# Patient Record
Sex: Female | Born: 1950 | Race: Black or African American | Hispanic: No | Marital: Married | Smoking: Former smoker
Health system: Southern US, Community
[De-identification: ages and names within clinical notes are randomized; demographics above are authoritative.]

## PROBLEM LIST (undated history)

## (undated) DIAGNOSIS — K219 Gastro-esophageal reflux disease without esophagitis: Secondary | ICD-10-CM

## (undated) DIAGNOSIS — F329 Major depressive disorder, single episode, unspecified: Secondary | ICD-10-CM

## (undated) DIAGNOSIS — G629 Polyneuropathy, unspecified: Secondary | ICD-10-CM

## (undated) DIAGNOSIS — T4145XA Adverse effect of unspecified anesthetic, initial encounter: Secondary | ICD-10-CM

## (undated) DIAGNOSIS — R7309 Other abnormal glucose: Secondary | ICD-10-CM

## (undated) DIAGNOSIS — F419 Anxiety disorder, unspecified: Secondary | ICD-10-CM

## (undated) DIAGNOSIS — T8859XA Other complications of anesthesia, initial encounter: Secondary | ICD-10-CM

## (undated) DIAGNOSIS — F32A Depression, unspecified: Secondary | ICD-10-CM

## (undated) DIAGNOSIS — C3490 Malignant neoplasm of unspecified part of unspecified bronchus or lung: Secondary | ICD-10-CM

## (undated) HISTORY — DX: Gastro-esophageal reflux disease without esophagitis: K21.9

## (undated) HISTORY — PX: ABDOMINAL HYSTERECTOMY: SHX81

## (undated) HISTORY — DX: Depression, unspecified: F32.A

## (undated) HISTORY — DX: Anxiety disorder, unspecified: F41.9

## (undated) HISTORY — DX: Other abnormal glucose: R73.09

## (undated) HISTORY — PX: KNEE ARTHROSCOPY: SUR90

## (undated) HISTORY — DX: Major depressive disorder, single episode, unspecified: F32.9

## (undated) HISTORY — PX: FOOT SURGERY: SHX648

## (undated) HISTORY — PX: OTHER SURGICAL HISTORY: SHX169

## (undated) HISTORY — PX: TUBAL LIGATION: SHX77

## (undated) HISTORY — DX: Polyneuropathy, unspecified: G62.9

---

## 1999-07-17 ENCOUNTER — Encounter: Payer: Self-pay | Admitting: Internal Medicine

## 1999-07-17 ENCOUNTER — Encounter: Admission: RE | Admit: 1999-07-17 | Discharge: 1999-07-17 | Payer: Self-pay | Admitting: Internal Medicine

## 1999-07-24 ENCOUNTER — Encounter: Admission: RE | Admit: 1999-07-24 | Discharge: 1999-07-24 | Payer: Self-pay | Admitting: Internal Medicine

## 1999-07-24 ENCOUNTER — Encounter: Payer: Self-pay | Admitting: Internal Medicine

## 2001-09-16 ENCOUNTER — Other Ambulatory Visit: Admission: RE | Admit: 2001-09-16 | Discharge: 2001-09-16 | Payer: Self-pay | Admitting: Obstetrics and Gynecology

## 2002-09-21 ENCOUNTER — Other Ambulatory Visit: Admission: RE | Admit: 2002-09-21 | Discharge: 2002-09-21 | Payer: Self-pay | Admitting: Obstetrics and Gynecology

## 2003-04-07 ENCOUNTER — Emergency Department (HOSPITAL_COMMUNITY): Admission: AD | Admit: 2003-04-07 | Discharge: 2003-04-07 | Payer: Self-pay | Admitting: Family Medicine

## 2003-07-26 ENCOUNTER — Emergency Department (HOSPITAL_COMMUNITY): Admission: EM | Admit: 2003-07-26 | Discharge: 2003-07-26 | Payer: Self-pay | Admitting: Family Medicine

## 2003-12-05 ENCOUNTER — Other Ambulatory Visit: Admission: RE | Admit: 2003-12-05 | Discharge: 2003-12-05 | Payer: Self-pay | Admitting: Obstetrics and Gynecology

## 2004-01-07 ENCOUNTER — Emergency Department (HOSPITAL_COMMUNITY): Admission: EM | Admit: 2004-01-07 | Discharge: 2004-01-07 | Payer: Self-pay | Admitting: Family Medicine

## 2004-07-29 ENCOUNTER — Ambulatory Visit: Payer: Self-pay | Admitting: Internal Medicine

## 2004-08-27 ENCOUNTER — Ambulatory Visit: Payer: Self-pay | Admitting: Internal Medicine

## 2004-09-03 ENCOUNTER — Ambulatory Visit: Payer: Self-pay | Admitting: Internal Medicine

## 2004-12-24 ENCOUNTER — Ambulatory Visit: Payer: Self-pay | Admitting: Internal Medicine

## 2005-01-07 ENCOUNTER — Ambulatory Visit: Payer: Self-pay | Admitting: Internal Medicine

## 2005-02-06 ENCOUNTER — Ambulatory Visit: Payer: Self-pay | Admitting: Internal Medicine

## 2005-03-07 ENCOUNTER — Ambulatory Visit: Payer: Self-pay | Admitting: Internal Medicine

## 2005-08-21 ENCOUNTER — Ambulatory Visit: Payer: Self-pay | Admitting: Internal Medicine

## 2005-08-25 ENCOUNTER — Ambulatory Visit: Payer: Self-pay | Admitting: Internal Medicine

## 2005-12-25 ENCOUNTER — Ambulatory Visit: Payer: Self-pay | Admitting: Internal Medicine

## 2006-02-17 ENCOUNTER — Ambulatory Visit: Payer: Self-pay | Admitting: Internal Medicine

## 2006-03-18 ENCOUNTER — Ambulatory Visit: Payer: Self-pay | Admitting: Internal Medicine

## 2006-05-09 ENCOUNTER — Ambulatory Visit: Payer: Self-pay | Admitting: Internal Medicine

## 2006-05-25 ENCOUNTER — Ambulatory Visit: Payer: Self-pay | Admitting: Internal Medicine

## 2006-06-19 ENCOUNTER — Ambulatory Visit: Payer: Self-pay | Admitting: Internal Medicine

## 2006-07-16 ENCOUNTER — Ambulatory Visit: Payer: Self-pay | Admitting: Internal Medicine

## 2006-09-03 ENCOUNTER — Ambulatory Visit: Payer: Self-pay | Admitting: Internal Medicine

## 2006-09-03 LAB — CONVERTED CEMR LAB
BUN: 11 mg/dL (ref 6–23)
CO2: 29 meq/L (ref 19–32)
Calcium: 9.3 mg/dL (ref 8.4–10.5)
Chloride: 104 meq/L (ref 96–112)
Creatinine, Ser: 0.9 mg/dL (ref 0.4–1.2)
GFR calc Af Amer: 84 mL/min
GFR calc non Af Amer: 69 mL/min
Glucose, Bld: 98 mg/dL (ref 70–99)
Hgb A1c MFr Bld: 5.6 % (ref 4.6–6.0)
Potassium: 4.2 meq/L (ref 3.5–5.1)
Sodium: 142 meq/L (ref 135–145)
Vit D, 1,25-Dihydroxy: 34 (ref 20–57)

## 2006-12-31 ENCOUNTER — Encounter: Payer: Self-pay | Admitting: Internal Medicine

## 2006-12-31 ENCOUNTER — Ambulatory Visit: Payer: Self-pay | Admitting: Internal Medicine

## 2006-12-31 DIAGNOSIS — F329 Major depressive disorder, single episode, unspecified: Secondary | ICD-10-CM | POA: Insufficient documentation

## 2006-12-31 DIAGNOSIS — F411 Generalized anxiety disorder: Secondary | ICD-10-CM | POA: Insufficient documentation

## 2006-12-31 DIAGNOSIS — I10 Essential (primary) hypertension: Secondary | ICD-10-CM | POA: Insufficient documentation

## 2006-12-31 DIAGNOSIS — G609 Hereditary and idiopathic neuropathy, unspecified: Secondary | ICD-10-CM | POA: Insufficient documentation

## 2007-04-15 ENCOUNTER — Telehealth: Payer: Self-pay | Admitting: Internal Medicine

## 2007-06-24 ENCOUNTER — Ambulatory Visit: Payer: Self-pay | Admitting: Internal Medicine

## 2007-06-24 LAB — CONVERTED CEMR LAB
ALT: 24 units/L (ref 0–35)
AST: 22 units/L (ref 0–37)
Albumin: 3.5 g/dL (ref 3.5–5.2)
Alkaline Phosphatase: 72 units/L (ref 39–117)
BUN: 10 mg/dL (ref 6–23)
Basophils Absolute: 0 10*3/uL (ref 0.0–0.1)
Basophils Relative: 0.2 % (ref 0.0–1.0)
Bilirubin, Direct: 0.1 mg/dL (ref 0.0–0.3)
CO2: 30 meq/L (ref 19–32)
Calcium: 9 mg/dL (ref 8.4–10.5)
Chloride: 105 meq/L (ref 96–112)
Creatinine, Ser: 1 mg/dL (ref 0.4–1.2)
Eosinophils Absolute: 0.1 10*3/uL (ref 0.0–0.7)
Eosinophils Relative: 1.7 % (ref 0.0–5.0)
GFR calc Af Amer: 74 mL/min
GFR calc non Af Amer: 61 mL/min
Glucose, Bld: 99 mg/dL (ref 70–99)
HCT: 42.1 % (ref 36.0–46.0)
Hemoglobin: 13.8 g/dL (ref 12.0–15.0)
Hgb A1c MFr Bld: 5.6 % (ref 4.6–6.0)
Lymphocytes Relative: 27.4 % (ref 12.0–46.0)
MCHC: 32.7 g/dL (ref 30.0–36.0)
MCV: 90.3 fL (ref 78.0–100.0)
Monocytes Absolute: 0.4 10*3/uL (ref 0.1–1.0)
Monocytes Relative: 7.4 % (ref 3.0–12.0)
Neutro Abs: 3.6 10*3/uL (ref 1.4–7.7)
Neutrophils Relative %: 63.3 % (ref 43.0–77.0)
Platelets: 255 10*3/uL (ref 150–400)
Potassium: 4 meq/L (ref 3.5–5.1)
RBC: 4.66 M/uL (ref 3.87–5.11)
RDW: 11.9 % (ref 11.5–14.6)
Sodium: 139 meq/L (ref 135–145)
TSH: 1.52 microintl units/mL (ref 0.35–5.50)
Total Bilirubin: 0.7 mg/dL (ref 0.3–1.2)
Total Protein: 6.7 g/dL (ref 6.0–8.3)
Vitamin B-12: 1091 pg/mL — ABNORMAL HIGH (ref 211–911)
WBC: 5.7 10*3/uL (ref 4.5–10.5)

## 2007-06-30 ENCOUNTER — Ambulatory Visit: Payer: Self-pay | Admitting: Internal Medicine

## 2007-06-30 DIAGNOSIS — J309 Allergic rhinitis, unspecified: Secondary | ICD-10-CM | POA: Insufficient documentation

## 2007-08-03 ENCOUNTER — Telehealth: Payer: Self-pay | Admitting: Internal Medicine

## 2007-11-04 ENCOUNTER — Encounter: Payer: Self-pay | Admitting: Internal Medicine

## 2007-11-11 ENCOUNTER — Telehealth: Payer: Self-pay | Admitting: Internal Medicine

## 2007-11-16 ENCOUNTER — Encounter: Payer: Self-pay | Admitting: Internal Medicine

## 2007-12-20 ENCOUNTER — Encounter: Payer: Self-pay | Admitting: Internal Medicine

## 2007-12-24 ENCOUNTER — Ambulatory Visit: Payer: Self-pay | Admitting: Internal Medicine

## 2007-12-24 LAB — CONVERTED CEMR LAB
ALT: 43 units/L — ABNORMAL HIGH (ref 0–35)
AST: 31 units/L (ref 0–37)
Albumin: 3.6 g/dL (ref 3.5–5.2)
Alkaline Phosphatase: 82 units/L (ref 39–117)
BUN: 13 mg/dL (ref 6–23)
Bilirubin, Direct: 0.1 mg/dL (ref 0.0–0.3)
CO2: 30 meq/L (ref 19–32)
Calcium: 9 mg/dL (ref 8.4–10.5)
Chloride: 105 meq/L (ref 96–112)
Creatinine, Ser: 0.9 mg/dL (ref 0.4–1.2)
GFR calc Af Amer: 83 mL/min
GFR calc non Af Amer: 69 mL/min
Glucose, Bld: 99 mg/dL (ref 70–99)
Potassium: 3.6 meq/L (ref 3.5–5.1)
Sodium: 141 meq/L (ref 135–145)
TSH: 1.86 microintl units/mL (ref 0.35–5.50)
Total Bilirubin: 0.5 mg/dL (ref 0.3–1.2)
Total Protein: 6.9 g/dL (ref 6.0–8.3)

## 2007-12-29 ENCOUNTER — Ambulatory Visit: Payer: Self-pay | Admitting: Internal Medicine

## 2008-05-01 ENCOUNTER — Ambulatory Visit: Payer: Self-pay | Admitting: Internal Medicine

## 2008-05-01 DIAGNOSIS — R05 Cough: Secondary | ICD-10-CM

## 2008-05-01 DIAGNOSIS — R609 Edema, unspecified: Secondary | ICD-10-CM

## 2008-05-03 ENCOUNTER — Ambulatory Visit: Payer: Self-pay | Admitting: Endocrinology

## 2008-07-27 ENCOUNTER — Ambulatory Visit: Payer: Self-pay | Admitting: Internal Medicine

## 2008-07-27 LAB — CONVERTED CEMR LAB
BUN: 10 mg/dL (ref 6–23)
CO2: 27 meq/L (ref 19–32)
Calcium: 9.3 mg/dL (ref 8.4–10.5)
Chloride: 107 meq/L (ref 96–112)
Creatinine, Ser: 1 mg/dL (ref 0.4–1.2)
GFR calc non Af Amer: 73.48 mL/min (ref >60)
Glucose, Bld: 121 mg/dL — ABNORMAL HIGH (ref 70–99)
Potassium: 3.5 meq/L (ref 3.5–5.1)
Sodium: 143 meq/L (ref 135–145)
TSH: 1.68 microintl units/mL (ref 0.35–5.50)

## 2008-07-28 ENCOUNTER — Ambulatory Visit: Payer: Self-pay | Admitting: Internal Medicine

## 2008-07-28 DIAGNOSIS — K219 Gastro-esophageal reflux disease without esophagitis: Secondary | ICD-10-CM

## 2008-08-04 ENCOUNTER — Encounter: Admission: RE | Admit: 2008-08-04 | Discharge: 2008-08-04 | Payer: Self-pay | Admitting: Internal Medicine

## 2008-10-04 ENCOUNTER — Telehealth: Payer: Self-pay | Admitting: Internal Medicine

## 2008-10-20 ENCOUNTER — Ambulatory Visit: Payer: Self-pay | Admitting: Internal Medicine

## 2008-10-20 LAB — CONVERTED CEMR LAB
Basophils Relative: 1.2 % (ref 0.0–3.0)
Bilirubin Urine: NEGATIVE
CO2: 28 meq/L (ref 19–32)
Chloride: 103 meq/L (ref 96–112)
Creatinine, Ser: 1 mg/dL (ref 0.4–1.2)
Eosinophils Relative: 1.2 % (ref 0.0–5.0)
HCT: 40.3 % (ref 36.0–46.0)
Hemoglobin, Urine: NEGATIVE
Hemoglobin: 13.4 g/dL (ref 12.0–15.0)
Lymphs Abs: 1.8 10*3/uL (ref 0.7–4.0)
MCV: 89.4 fL (ref 78.0–100.0)
Monocytes Absolute: 0.5 10*3/uL (ref 0.1–1.0)
Monocytes Relative: 7.5 % (ref 3.0–12.0)
Potassium: 4 meq/L (ref 3.5–5.1)
RBC: 4.51 M/uL (ref 3.87–5.11)
Sodium: 141 meq/L (ref 135–145)
Total Protein, Urine: NEGATIVE mg/dL
Urine Glucose: NEGATIVE mg/dL
WBC: 6.1 10*3/uL (ref 4.5–10.5)
pH: 7.5 (ref 5.0–8.0)

## 2008-10-25 ENCOUNTER — Ambulatory Visit: Payer: Self-pay | Admitting: Internal Medicine

## 2008-11-29 ENCOUNTER — Encounter: Payer: Self-pay | Admitting: Internal Medicine

## 2008-12-12 ENCOUNTER — Telehealth: Payer: Self-pay | Admitting: Internal Medicine

## 2008-12-13 ENCOUNTER — Encounter: Payer: Self-pay | Admitting: Internal Medicine

## 2008-12-18 ENCOUNTER — Ambulatory Visit: Payer: Self-pay | Admitting: Internal Medicine

## 2009-01-02 ENCOUNTER — Telehealth: Payer: Self-pay | Admitting: Internal Medicine

## 2009-02-16 ENCOUNTER — Ambulatory Visit: Payer: Self-pay | Admitting: Internal Medicine

## 2009-02-16 LAB — CONVERTED CEMR LAB
ALT: 24 units/L (ref 0–35)
Albumin: 3.8 g/dL (ref 3.5–5.2)
Alkaline Phosphatase: 114 units/L (ref 39–117)
Bilirubin, Direct: 0.1 mg/dL (ref 0.0–0.3)
CO2: 31 meq/L (ref 19–32)
Calcium: 9.6 mg/dL (ref 8.4–10.5)
Chloride: 106 meq/L (ref 96–112)
Creatinine, Ser: 0.9 mg/dL (ref 0.4–1.2)
Glucose, Bld: 101 mg/dL — ABNORMAL HIGH (ref 70–99)
Sodium: 142 meq/L (ref 135–145)
Total Protein: 7.2 g/dL (ref 6.0–8.3)

## 2009-02-23 ENCOUNTER — Ambulatory Visit: Payer: Self-pay | Admitting: Internal Medicine

## 2009-02-23 DIAGNOSIS — Z87891 Personal history of nicotine dependence: Secondary | ICD-10-CM

## 2009-04-23 ENCOUNTER — Telehealth: Payer: Self-pay | Admitting: Internal Medicine

## 2009-05-14 ENCOUNTER — Ambulatory Visit: Payer: Self-pay | Admitting: Internal Medicine

## 2009-05-14 DIAGNOSIS — J329 Chronic sinusitis, unspecified: Secondary | ICD-10-CM | POA: Insufficient documentation

## 2009-06-11 ENCOUNTER — Ambulatory Visit: Payer: Self-pay | Admitting: Internal Medicine

## 2009-06-11 DIAGNOSIS — N3090 Cystitis, unspecified without hematuria: Secondary | ICD-10-CM | POA: Insufficient documentation

## 2009-06-11 DIAGNOSIS — B354 Tinea corporis: Secondary | ICD-10-CM | POA: Insufficient documentation

## 2009-06-12 LAB — CONVERTED CEMR LAB
Hemoglobin, Urine: NEGATIVE
Ketones, ur: NEGATIVE mg/dL
Specific Gravity, Urine: >=1.03 (ref 1.000–1.030)
Urine Glucose: NEGATIVE mg/dL
Urobilinogen, UA: 0.2 (ref 0.0–1.0)

## 2009-08-20 ENCOUNTER — Ambulatory Visit: Payer: Self-pay | Admitting: Internal Medicine

## 2009-08-20 LAB — CONVERTED CEMR LAB
ALT: 22 units/L (ref 0–35)
AST: 19 units/L (ref 0–37)
BUN: 11 mg/dL (ref 6–23)
Basophils Relative: 0.6 % (ref 0.0–3.0)
Bilirubin Urine: NEGATIVE
CO2: 28 meq/L (ref 19–32)
Calcium: 9 mg/dL (ref 8.4–10.5)
Chloride: 109 meq/L (ref 96–112)
Creatinine, Ser: 0.8 mg/dL (ref 0.4–1.2)
Eosinophils Relative: 1.7 % (ref 0.0–5.0)
Glucose, Bld: 98 mg/dL (ref 70–99)
HCT: 40.2 % (ref 36.0–46.0)
Hemoglobin, Urine: NEGATIVE
Hemoglobin: 13.4 g/dL (ref 12.0–15.0)
Ketones, ur: NEGATIVE mg/dL
Leukocytes, UA: NEGATIVE
Lymphs Abs: 1.9 10*3/uL (ref 0.7–4.0)
MCV: 91.3 fL (ref 78.0–100.0)
Monocytes Absolute: 0.5 10*3/uL (ref 0.1–1.0)
Monocytes Relative: 8.1 % (ref 3.0–12.0)
Platelets: 227 10*3/uL (ref 150.0–400.0)
RBC: 4.41 M/uL (ref 3.87–5.11)
TSH: 1.76 microintl units/mL (ref 0.35–5.50)
Total Bilirubin: 0.4 mg/dL (ref 0.3–1.2)
Total CHOL/HDL Ratio: 5
Total Protein, Urine: NEGATIVE mg/dL
Total Protein: 6.5 g/dL (ref 6.0–8.3)
WBC: 5.8 10*3/uL (ref 4.5–10.5)
pH: 5.5 (ref 5.0–8.0)

## 2009-08-29 ENCOUNTER — Ambulatory Visit: Payer: Self-pay | Admitting: Internal Medicine

## 2009-08-29 DIAGNOSIS — M545 Low back pain, unspecified: Secondary | ICD-10-CM | POA: Insufficient documentation

## 2009-08-29 DIAGNOSIS — M542 Cervicalgia: Secondary | ICD-10-CM

## 2009-10-29 ENCOUNTER — Ambulatory Visit: Payer: Self-pay | Admitting: Internal Medicine

## 2009-10-29 DIAGNOSIS — N390 Urinary tract infection, site not specified: Secondary | ICD-10-CM

## 2009-10-29 DIAGNOSIS — R509 Fever, unspecified: Secondary | ICD-10-CM

## 2009-10-29 DIAGNOSIS — R1084 Generalized abdominal pain: Secondary | ICD-10-CM | POA: Insufficient documentation

## 2009-10-30 LAB — CONVERTED CEMR LAB
BUN: 10 mg/dL (ref 6–23)
Basophils Absolute: 0 10*3/uL (ref 0.0–0.1)
Bilirubin Urine: NEGATIVE
Chloride: 102 meq/L (ref 96–112)
Creatinine, Ser: 1 mg/dL (ref 0.4–1.2)
Eosinophils Absolute: 0 10*3/uL (ref 0.0–0.7)
Glucose, Bld: 83 mg/dL (ref 70–99)
HCT: 36.5 % (ref 36.0–46.0)
Lymphs Abs: 1.8 10*3/uL (ref 0.7–4.0)
MCV: 89.9 fL (ref 78.0–100.0)
Monocytes Absolute: 1.1 10*3/uL — ABNORMAL HIGH (ref 0.1–1.0)
Neutrophils Relative %: 73.5 % (ref 43.0–77.0)
Nitrite: NEGATIVE
Platelets: 236 10*3/uL (ref 150.0–400.0)
Potassium: 4.1 meq/L (ref 3.5–5.1)
RDW: 13.1 % (ref 11.5–14.6)
Urobilinogen, UA: 0.2 (ref 0.0–1.0)

## 2009-12-19 ENCOUNTER — Encounter (INDEPENDENT_AMBULATORY_CARE_PROVIDER_SITE_OTHER): Payer: Self-pay | Admitting: *Deleted

## 2010-01-08 ENCOUNTER — Encounter: Payer: Self-pay | Admitting: Internal Medicine

## 2010-01-18 ENCOUNTER — Ambulatory Visit: Payer: Self-pay | Admitting: Internal Medicine

## 2010-01-18 DIAGNOSIS — E669 Obesity, unspecified: Secondary | ICD-10-CM | POA: Insufficient documentation

## 2010-04-01 ENCOUNTER — Encounter: Payer: Self-pay | Admitting: Internal Medicine

## 2010-04-11 NOTE — Assessment & Plan Note (Signed)
Summary: sinus infection? /SD   Vital Signs:  Patient profile:   60 year old female Height:      65 inches (165.10 cm) Weight:      228.12 pounds (103.69 kg) O2 Sat:      97 % on Room air Temp:     98.4 degrees F (36.89 degrees C) oral Pulse rate:   88 / minute BP sitting:   112 / 68  Vitals Entered By: Orlan Leavens (May 14, 2009 2:44 PM)  O2 Flow:  Room air CC: ? sinus infection, URI symptoms Is Patient Diabetic? No Pain Assessment Patient in pain? no        Primary Care Provider:  Georgina Quint Plotnikov MD  CC:  ? sinus infection and URI symptoms.  History of Present Illness:  URI Symptoms      This is a 60 year old woman who presents with URI symptoms.  The symptoms began 5 days ago.  The severity is described as moderate.  took amox x 7days last mo from urg care for sinus infx - felt better for a while, then sick again once abx completed.  The patient reports nasal congestion, purulent nasal discharge, sore throat, dry cough, and sick contacts, but denies productive cough and earache.  The patient denies fever, dyspnea, wheezing, rash, vomiting, and use of an antipyretic.  The patient also reports seasonal symptoms and headache.  The patient denies itchy throat, sneezing, muscle aches, and severe fatigue.  The patient denies the following risk factors for Strep sinusitis: Strep exposure and tender adenopathy.    Current Medications (verified): 1)  Ativan 1 Mg  Tabs (Lorazepam) .Marland Kitchen.. 1 Tab 2  Times A Day 2)  Wellbutrin Sr 150 Mg Tb12 (Bupropion Hcl) .Marland Kitchen.. 1 Bid 3)  Gabapentin 300 Mg  Caps (Gabapentin) .... 2 By Mouth Two Times A Day Prn 4)  Triamcinolone Acetonide 0.5 % Crea (Triamcinolone Acetonide) .... Apply Bid To Affected Area 5)  Fluticasone Propionate 50 Mcg/act  Susp (Fluticasone Propionate) .... 2 Sprays Each Nostril Once Daily 6)  Coreg 12.5 Mg Tabs (Carvedilol) .Marland Kitchen.. 1 By Mouth Bid 7)  Spironolactone 25 Mg Tabs (Spironolactone) .Marland Kitchen.. 1 By Mouth Qam 8)  Vitamin D3 1000  Unit  Tabs (Cholecalciferol) .Marland Kitchen.. 1 By Mouth Daily  Allergies (verified): 1)  Benazepril Hcl (Benazepril Hcl) 2)  Amlodipine Besylate (Amlodipine Besylate)  Past History:  Past Medical History: Reviewed history from 07/28/2008 and no changes required. Anxiety Depression Peripheral neuropathy, feet; unknown etiol. Elev. glucose Allergic rhinitis GERD  Review of Systems       The patient complains of hoarseness.  The patient denies weight gain, chest pain, syncope, peripheral edema, and abdominal pain.    Physical Exam  General:  overweight-appearing.  alert, well-developed, well-nourished, and cooperative to examination.   mildly ill and hoarse voice quality Eyes:  vision grossly intact; pupils equal, round and reactive to light.  conjunctiva and lids normal.    Ears:  normal pinnae bilaterally, without erythema, swelling, or tenderness to palpation. TMs clear, without effusion, or cerumen impaction. Hearing grossly normal bilaterally  Mouth:  teeth and gums in good repair; mucous membranes moist, without lesions or ulcers. oropharynx clear without exudate, mild erythema. +yellowgreen PND Lungs:  normal respiratory effort, no intercostal retractions or use of accessory muscles; normal breath sounds bilaterally - no crackles and no wheezes.    Heart:  normal rate, regular rhythm, no murmur, and no rub. BLE without edema. Psych:  Oriented X3, memory  intact for recent and remote, normally interactive, good eye contact, not anxious appearing, not depressed appearing, and not agitated.      Impression & Recommendations:  Problem # 1:  SINUSITIS (ICD-473.9)  Her updated medication list for this problem includes:    Fluticasone Propionate 50 Mcg/act Susp (Fluticasone propionate) .Marland Kitchen... 2 sprays each nostril once daily    Azithromycin 250 Mg Tabs (Azithromycin) .Marland Kitchen... 2 tabs by mouth today, then 1 by mouth daily starting tomorrow  Take antibiotics for full duration. Discussed treatment  options includingprescription and OTC meds  Problem # 2:  ALLERGIC RHINITIS (ICD-477.9) rec also use of antihistamine for same and encouraged compliance with nasal steroid Her updated medication list for this problem includes:    Fluticasone Propionate 50 Mcg/act Susp (Fluticasone propionate) .Marland Kitchen... 2 sprays each nostril once daily  Complete Medication List: 1)  Ativan 1 Mg Tabs (Lorazepam) .Marland Kitchen.. 1 tab 2  times a day 2)  Wellbutrin Sr 150 Mg Tb12 (Bupropion hcl) .Marland Kitchen.. 1 bid 3)  Gabapentin 300 Mg Caps (Gabapentin) .... 2 by mouth two times a day prn 4)  Triamcinolone Acetonide 0.5 % Crea (Triamcinolone acetonide) .... Apply bid to affected area 5)  Fluticasone Propionate 50 Mcg/act Susp (Fluticasone propionate) .... 2 sprays each nostril once daily 6)  Coreg 12.5 Mg Tabs (Carvedilol) .Marland Kitchen.. 1 by mouth bid 7)  Spironolactone 25 Mg Tabs (Spironolactone) .Marland Kitchen.. 1 by mouth qam 8)  Vitamin D3 1000 Unit Tabs (Cholecalciferol) .Marland Kitchen.. 1 by mouth daily 9)  Azithromycin 250 Mg Tabs (Azithromycin) .... 2 tabs by mouth today, then 1 by mouth daily starting tomorrow  Patient Instructions: 1)  it was good to see you today. 2)  antibiotics for your sinus symptoms - [facility] - your prescription has been electronically submitted to your pharmacy. Please take as directed. Contact our office if you believe you're having problems with the medication(s).  3)  also, continue the nasal steroid (flonase) spray- 4)  take plain claritin or zyrtec or allegra once daily for next 7 days, then as needed for runny nose and dainage symptoms  5)  Get plenty of rest, drink lots of clear liquids, and use Tylenol or Ibuprofen for fever and comfort. Return in 7-10 days if you're not better:sooner if you're feeling worse. Prescriptions: AZITHROMYCIN 250 MG TABS (AZITHROMYCIN) 2 tabs by mouth today, then 1 by mouth daily starting tomorrow  #6 x 0   Entered and Authorized by:   Newt Lukes MD   Signed by:   Newt Lukes MD on  05/14/2009   Method used:   Electronically to        [facility] # [phone]* (retail)       [address]       [place], [place]  [address]       Ph: [phone]       Fax: [phone]   RxID:   [id number]

## 2010-04-11 NOTE — Assessment & Plan Note (Signed)
Summary: CPX / NWS  #   Vital Signs:  Patient profile:   60 year old female Weight:      229 pounds BMI:     38.25 Temp:     98.2 degrees F oral Pulse rate:   79 / minute Resp:     16 per minute BP sitting:   126 / 84  (left arm) Cuff size:   regular  Vitals Entered By: Lanier Prude, CMA(AAMA) (August 29, 2009 2:00 PM) CC: cpx   Primary Care Provider:  Tresa Garter MD  CC:  cpx.  History of Present Illness: The patient presents for a wellness examination  C/o LBP x years irrad to legs - spasms - worse C/o cervical pain  Current Medications (verified): 1)  Ativan 1 Mg  Tabs (Lorazepam) .Marland Kitchen.. 1 Tab 2  Times A Day 2)  Wellbutrin Sr 150 Mg Tb12 (Bupropion Hcl) .Marland Kitchen.. 1 Bid 3)  Gabapentin 300 Mg  Caps (Gabapentin) .... 2 By Mouth Two Times A Day Prn 4)  Triamcinolone Acetonide 0.5 % Crea (Triamcinolone Acetonide) .... Apply Bid To Affected Area 5)  Fluticasone Propionate 50 Mcg/act  Susp (Fluticasone Propionate) .... 2 Sprays Each Nostril Once Daily 6)  Coreg 12.5 Mg Tabs (Carvedilol) .Marland Kitchen.. 1 By Mouth Bid 7)  Spironolactone 25 Mg Tabs (Spironolactone) .Marland Kitchen.. 1 By Mouth Qam 8)  Vitamin D3 1000 Unit  Tabs (Cholecalciferol) .Marland Kitchen.. 1 By Mouth Daily 9)  Diflucan 100 Mg Tabs (Fluconazole) .... 2 By Mouth On Day 1 Then 1 By Mouth Once Daily X 2 Wks  Allergies (verified): 1)  Benazepril Hcl (Benazepril Hcl) 2)  Amlodipine Besylate (Amlodipine Besylate)  Past History:  Past Medical History: Last updated: 07/28/2008 Anxiety Depression Peripheral neuropathy, feet; unknown etiol. Elev. 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 Family History  Hypertension A lot of GS in the family  Social History: Reviewed history from 12/18/2008 and no changes required. 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  The patient denies anorexia, fever, weight loss, vision loss, decreased hearing, hoarseness, chest pain, syncope, dyspnea on exertion, peripheral edema, prolonged cough, headaches, hemoptysis, abdominal pain, melena, hematochezia, severe indigestion/heartburn, hematuria, incontinence, genital sores, muscle weakness, suspicious skin lesions, transient blindness, difficulty walking, depression, unusual weight change, abnormal bleeding, enlarged lymph nodes, angioedema, and breast masses.    Physical Exam  General:  overweight-appearing.  alert, well-developed, well-nourished, and cooperative to examination.    Head:  normocephalic and atraumatic.   Eyes:  vision grossly intact; pupils equal, round and reactive to light.  conjunctiva and lids normal.    Ears:  External ear exam shows no significant lesions or deformities.  Otoscopic examination reveals clear canals, tympanic membranes are intact bilaterally without bulging, retraction, inflammation or discharge. Hearing is grossly normal bilaterally. Nose:  External nasal examination shows no deformity or inflammation. Nasal mucosa are pink and moist without lesions or exudates. Mouth:  WNL Neck:  supple, full ROM, no masses, no carotid bruits, no cervical lymphadenopathy, Cervical spine is tender to palpation over paraspinal muscles and with the ROM  Lungs:  normal respiratory effort, no intercostal retractions or use of accessory muscles;  normal breath sounds bilaterally - no crackles and no wheezes.    Heart:  normal rate, regular rhythm, no murmur, and no rub. BLE without edema. Abdomen:  soft, non-tender, normal bowel sounds, no distention, no masses, no guarding, no hepatomegaly, and no splenomegaly.   Msk:   normal ROM, no joint tenderness, no joint swelling, and no joint warmth.   Lumbar-sacral spine is tender to palpation over paraspinal muscles and painfull with the ROM  Pulses:  R and L carotid,radial,femoral,dorsalis pedis and posterior tibial pulses are full and equal bilaterally Extremities:  No clubbing, cyanosis, edema, or deformity noted with normal full range of motion of all joints.   Neurologic:  No cranial nerve deficits noted. Station and gait are normal. Plantar reflexes are down-going bilaterally. DTRs are symmetrical throughout. Sensory, motor and coordinative functions appear intact. Skin:  pale maculas on neck/trunk Cervical Nodes:  No lymphadenopathy noted Psych:  Oriented X3, memory intact for recent and remote, normally interactive, good eye contact, not anxious appearing, not depressed appearing, and not agitated.      Impression & Recommendations:  Problem # 1:  PHYSICAL EXAMINATION (ICD-V70.0) Assessment New D/c sodas Orders: EKG w/ Interpretation (93000) Health and age related issues were discussed. Available screening tests and vaccinations were discussed as well. Healthy life style including good diet and execise was discussed.  The labs were reviewed with the patient.   Problem # 2:  LOW BACK PAIN, CHRONIC (ICD-724.2) Assessment: Deteriorated  Her updated medication list for this problem includes:    Vimovo 500-20 Mg Tbec (Naproxen-esomeprazole) .Marland Kitchen... 1 by mouth once daily - two times a day pc as needed pain    Tramadol Hcl 50 Mg Tabs (Tramadol hcl) .Marland Kitchen... 1-2 tabs by mouth two times a day as needed pain  Problem # 3:  NECK PAIN (ICD-723.1) Assessment: Deteriorated  Her updated medication list for this problem includes:    Vimovo 500-20 Mg Tbec (Naproxen-esomeprazole) .Marland Kitchen... 1 by mouth once daily - two times a day pc as needed pain    Tramadol Hcl 50 Mg Tabs (Tramadol hcl) .Marland Kitchen... 1-2 tabs by mouth two times a day as needed pain  Problem # 4:  EDEMA  (ICD-782.3) Assessment: Comment Only  Her updated medication list for this problem includes:    Spironolactone 25 Mg Tabs (Spironolactone) .Marland Kitchen... 1 by mouth qam  Problem # 5:  DEPRESSION (ICD-311) Assessment: Unchanged  Her updated medication list for this problem includes:    Ativan 1 Mg Tabs (Lorazepam) .Marland Kitchen... 1 tab 2  times a day    Wellbutrin Sr 150 Mg Tb12 (Bupropion hcl) .Marland Kitchen... 1 bid  Problem # 6:  HYPERTENSION (ICD-401.1) Assessment: Unchanged  Her updated medication list for this problem includes:    Coreg 12.5 Mg Tabs (Carvedilol) .Marland Kitchen... 1 by mouth bid    Spironolactone 25 Mg Tabs (Spironolactone) .Marland Kitchen... 1 by mouth qam  Complete Medication List: 1)  Ativan 1 Mg Tabs (Lorazepam) .Marland Kitchen.. 1 tab 2  times a day 2)  Wellbutrin Sr 150 Mg Tb12 (Bupropion hcl) .Marland Kitchen.. 1 bid 3)  Gabapentin 300 Mg Caps (Gabapentin) .... 2 by mouth two times a day prn 4)  Triamcinolone Acetonide 0.5 % Crea (Triamcinolone acetonide) .... Apply bid to affected area 5)  Fluticasone Propionate 50 Mcg/act Susp (Fluticasone propionate) .... 2 sprays each nostril once daily 6)  Coreg 12.5 Mg Tabs (Carvedilol) .Marland Kitchen.. 1 by mouth bid 7)  Spironolactone 25 Mg Tabs (Spironolactone) .Marland Kitchen.. 1 by mouth qam 8)  Vitamin D3 1000  Unit Tabs (Cholecalciferol) .Marland Kitchen.. 1 by mouth daily 9)  Vimovo 500-20 Mg Tbec (Naproxen-esomeprazole) .Marland Kitchen.. 1 by mouth once daily - two times a day pc as needed pain 10)  Tramadol Hcl 50 Mg Tabs (Tramadol hcl) .Marland Kitchen.. 1-2 tabs by mouth two times a day as needed pain  Patient Instructions: 1)  Please schedule a follow-up appointment in 4 months. 2)  Try to eat more raw plant food, fresh and dry fruit, raw almonds, leafy vegetables, whole foods and less red meat, less animal fat. Poultry and fish is better for you than pork and beef. Avoid processed foods (canned soups, hot dogs, sausage, bacon , frozen dinners). Avoid corn syrup, high fructose syrup or aspartam   containing drinks. Honey, Agave and Stevia are better  sweeteners. Make your own  dressing with olive oil, wine vinegar, lemon juce, garlic etc. for your salads. 3)  Contour pillow 4)  Use stretching and balance exercises that I have provided (15 min. or longer every day)  Prescriptions: TRAMADOL HCL 50 MG TABS (TRAMADOL HCL) 1-2 tabs by mouth two times a day as needed pain  #120 x 3   Entered and Authorized by:   Tresa Garter MD   Signed by:   Tresa Garter MD on 08/29/2009   Method used:   Print then Give to Patient   RxID:   [id number] VIMOVO 500-20 MG TBEC (NAPROXEN-ESOMEPRAZOLE) 1 by mouth once daily - two times a day pc as needed pain  #60 x 3   Entered and Authorized by:   Tresa Garter MD   Signed by:   Tresa Garter MD on 08/29/2009   Method used:   Print then Give to Patient   RxID:   [id number]

## 2010-04-11 NOTE — Assessment & Plan Note (Signed)
Summary: PER PT 4 MTH FU--STC  RS'D FROM BUMP LIST/NWS   Vital Signs:  Patient profile:   60 year old female Height:      65 inches Weight:      228 pounds BMI:     38.08 Temp:     99.1 degrees F oral Pulse rate:   80 / minute Pulse rhythm:   regular Resp:     16 per minute BP sitting:   124 / 80  (left arm) Cuff size:   large  Vitals Entered By: Lanier Prude, CMA(AAMA) (January 18, 2010 8:03 AM) CC: 4 mo f/u c/o bilateral ankle edema Is Patient Diabetic? No   Primary Care Fronnie Urton:  Tresa Garter MD  CC:  4 mo f/u c/o bilateral ankle edema.  History of Present Illness: The patient presents for a follow up of hypertension, neuropathy C/o ankle swelling x months worse at hs  Current Medications (verified): 1)  Ativan 1 Mg  Tabs (Lorazepam) .Marland Kitchen.. 1 Tab 2  Times A Day 2)  Wellbutrin Sr 150 Mg Tb12 (Bupropion Hcl) .Marland Kitchen.. 1 Bid 3)  Gabapentin 300 Mg  Caps (Gabapentin) .... 2 By Mouth Two Times A Day Prn 4)  Triamcinolone Acetonide 0.5 % Crea (Triamcinolone Acetonide) .... Apply Bid To Affected Area 5)  Fluticasone Propionate 50 Mcg/act  Susp (Fluticasone Propionate) .... 2 Sprays Each Nostril Once Daily 6)  Coreg 12.5 Mg Tabs (Carvedilol) .Marland Kitchen.. 1 By Mouth Bid 7)  Spironolactone 25 Mg Tabs (Spironolactone) .Marland Kitchen.. 1 By Mouth Qam 8)  Vitamin D3 1000 Unit  Tabs (Cholecalciferol) .Marland Kitchen.. 1 By Mouth Daily 9)  Vimovo 500-20 Mg Tbec (Naproxen-Esomeprazole) .Marland Kitchen.. 1 By Mouth Once Daily - Two Times A Day Pc As Needed Pain 10)  Tramadol Hcl 50 Mg Tabs (Tramadol Hcl) .Marland Kitchen.. 1-2 Tabs By Mouth Two Times A Day As Needed Pain  Allergies (verified): 1)  Benazepril Hcl (Benazepril Hcl) 2)  Amlodipine Besylate (Amlodipine Besylate)  Family History: Reviewed history from 07/28/2008 and no changes required. Family History Hypertension A lot of GS in the family  Social History: Reviewed history from 12/18/2008 and no changes required. 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 Loose wt. Not taking Vimovo See "Patient Instructions".  Her updated medication list for this problem includes:    Spironolactone 25 Mg Tabs (Spironolactone) .Marland Kitchen... 1 by mouth qam  Problem # 2:  LOW BACK PAIN, CHRONIC (ICD-724.2) Assessment: Unchanged  The following medications were removed from the medication list:    Vimovo 500-20 Mg Tbec (Naproxen-esomeprazole) .Marland Kitchen... 1 by mouth once daily - two times a day pc as needed pain    Tramadol Hcl 50 Mg Tabs (Tramadol hcl) .Marland Kitchen... 1-2 tabs by mouth two times a day as needed pain  Problem # 3:  DEPRESSION (ICD-311)  Her updated medication list for this problem includes:    Ativan 1 Mg Tabs (Lorazepam) .Marland Kitchen... 1 tab 2  times a day    Wellbutrin Sr 150 Mg Tb12 (Bupropion hcl) .Marland Kitchen... 1 bid  Problem # 4:  PERIPHERAL NEUROPATHY (ICD-356.9) Assessment: Unchanged  Problem # 5:  OBESITY (ICD-278.00) Assessment: Unchanged Loose wt  Problem # 6:  Preventive Health Care (ICD-V70.0) Assessment: Comment Only See your GYN!  Complete Medication List: 1)  Ativan 1 Mg Tabs (Lorazepam) .Marland Kitchen.. 1 tab 2  times a  day 2)  Wellbutrin Sr 150 Mg Tb12 (Bupropion hcl) .Marland Kitchen.. 1 bid 3)  Gabapentin 300 Mg Caps (Gabapentin) .... 2 by mouth two times a day prn 4)  Triamcinolone Acetonide 0.5 % Crea (Triamcinolone acetonide) .... Apply bid to affected area 5)  Fluticasone Propionate 50 Mcg/act Susp (Fluticasone propionate) .... 2 sprays each nostril once daily 6)  Coreg 12.5 Mg Tabs (Carvedilol) .Marland Kitchen.. 1 by mouth bid 7)  Spironolactone 25 Mg Tabs (Spironolactone) .Marland Kitchen.. 1 by mouth qam 8)  Vitamin D3 1000 Unit Tabs (Cholecalciferol) .Marland Kitchen.. 1 by mouth daily  Patient Instructions: 1)  Get support knee highs 2)  Loose weight 3)  Please schedule a follow-up appointment in 3  months.  Contraindications/Deferment of Procedures/Staging:    Test/Procedure: FLU VAX    Reason for deferment: patient declined  Prescriptions: SPIRONOLACTONE 25 MG TABS (SPIRONOLACTONE) 1 by mouth qam  #90 Each x 0   Entered and Authorized by:   Tresa Garter MD   Signed by:   Tresa Garter MD on 01/18/2010   Method used:   Print then Give to Patient   RxID:   [id number] COREG 12.5 MG TABS (CARVEDILOL) 1 by mouth bid  #90 x 3   Entered and Authorized by:   Tresa Garter MD   Signed by:   Tresa Garter MD on 01/18/2010   Method used:   Print then Give to Patient   RxID:   [id number] ATIVAN 1 MG  TABS (LORAZEPAM) 1 tab 2  times a day  #60 x 1   Entered and Authorized by:   Tresa Garter MD   Signed by:   Tresa Garter MD on 01/18/2010   Method used:   Print then Give to Patient   RxID:   [id number] WELLBUTRIN SR 150 MG TB12 (BUPROPION HCL) 1 bid  #180 x 3   Entered and Authorized by:   Tresa Garter MD   Signed by:   Tresa Garter MD on 01/18/2010   Method used:   Print then Give to Patient   RxID:   [id number] GABAPENTIN 300 MG  CAPS (GABAPENTIN) 2 by mouth two times a day prn  #180 Each x 1   Entered and Authorized by:   Tresa Garter MD   Signed by:   Tresa Garter MD on 01/18/2010   Method used:   Print then Give to Patient   RxID:   [id number]    Orders Added: 1)  Est. Patient Level IV [06301]

## 2010-04-11 NOTE — Progress Notes (Signed)
Summary: Call a nurse report  Phone Note Other Incoming   Caller: Call a nurse report from 04-21-2009 / Operator:  Caswell Corwin Summary of Call: Documentation of fax rec'd:   04-21-2009 10:04:23 am--- Patient called stating that she has has HA x 2 days with sore throat, as well as cough, ear and facial pain x4 days. Does not have a thermometer. HA in main complaint. Patient has taken Tylenol and Sudafed without relief. Operator instructed patient to be seen in office within 4 hrs. Initial call taken by: Lucious Groves,  April 23, 2009 11:52 AM  Follow-up for Phone Call        agree. Thx Follow-up by: Tresa Garter MD,  April 23, 2009 3:44 PM

## 2010-04-11 NOTE — Letter (Signed)
Summary: Colonoscopy Letter  [facility]  [address] [place], [place] [address]   Phone: [phone]  Fax: [phone]      December 19, 2009 MRN: [id number]   [facility] [address] [place], [place]  [address]   Dear Ms. Mowrer,   According to your medical record, it is time for you to schedule a Colonoscopy. The American Cancer Society recommends this procedure as a method to detect early colon cancer. Patients with a family history of colon cancer, or a personal history of colon polyps or inflammatory bowel disease are at increased risk.  This letter has been generated based on the recommendations made at the time of your procedure. If you feel that in your particular situation this may no longer apply, please contact our office.  Please call our office at [phone] to schedule this appointment or to update your records at your earliest convenience.  Thank you for cooperating with [place] to provide you with the very best care possible.   Sincerely,   Iva Boop, M.D.  [facility] [phone]

## 2010-04-11 NOTE — Assessment & Plan Note (Signed)
Summary: freq urination/thirsty/cd   Vital Signs:  Patient profile:   60 year old female Height:      65 inches Weight:      231 pounds BMI:     38.58 Temp:     102.1 degrees F oral Pulse rate:   72 / minute Pulse rhythm:   regular Resp:     16 per minute BP sitting:   120 / 72  (left arm) Cuff size:   large  Vitals Entered By: Lanier Prude, CMA(AAMA) (October 29, 2009 4:18 PM) CC: fever, low abd pain/tingling during urination X 2 wks Is Patient Diabetic? No Comments pt is not taking Vit D, Vimovo orTramadol.   Primary Care Marlene Pfluger:  Tresa Garter MD  CC:  fever and low abd pain/tingling during urination X 2 wks.  History of Present Illness: C/o fever and lower abd pain on Sat - chills. No n/v. No dysuria. No GI symptoms   Current Medications (verified): 1)  Ativan 1 Mg  Tabs (Lorazepam) .Marland Kitchen.. 1 Tab 2  Times A Day 2)  Wellbutrin Sr 150 Mg Tb12 (Bupropion Hcl) .Marland Kitchen.. 1 Bid 3)  Gabapentin 300 Mg  Caps (Gabapentin) .... 2 By Mouth Two Times A Day Prn 4)  Triamcinolone Acetonide 0.5 % Crea (Triamcinolone Acetonide) .... Apply Bid To Affected Area 5)  Fluticasone Propionate 50 Mcg/act  Susp (Fluticasone Propionate) .... 2 Sprays Each Nostril Once Daily 6)  Coreg 12.5 Mg Tabs (Carvedilol) .Marland Kitchen.. 1 By Mouth Bid 7)  Spironolactone 25 Mg Tabs (Spironolactone) .Marland Kitchen.. 1 By Mouth Qam 8)  Vitamin D3 1000 Unit  Tabs (Cholecalciferol) .Marland Kitchen.. 1 By Mouth Daily 9)  Vimovo 500-20 Mg Tbec (Naproxen-Esomeprazole) .Marland Kitchen.. 1 By Mouth Once Daily - Two Times A Day Pc As Needed Pain 10)  Tramadol Hcl 50 Mg Tabs (Tramadol Hcl) .Marland Kitchen.. 1-2 Tabs By Mouth Two Times A Day As Needed Pain  Allergies (verified): 1)  Benazepril Hcl (Benazepril Hcl) 2)  Amlodipine Besylate (Amlodipine Besylate)  Past History:  Past Medical History: Last updated: 07/28/2008 Anxiety Depression Peripheral neuropathy, feet; unknown etiol. Elev. glucose Allergic rhinitis GERD  Social History: Last updated:  12/18/2008 Occupation: TA Former Smoker Married 2009 Divorced  Review of Systems       The patient complains of fever and abdominal pain.  The patient denies dyspnea on exertion.    Physical Exam  General:  Looks tired; dressed in a sweater overweight-appearing.   Nose:  External nasal examination shows no deformity or inflammation. Nasal mucosa are pink and moist without lesions or exudates. Mouth:  WNL Neck:  supple, full ROM, no masses, no carotid bruits, no cervical lymphadenopathy, Cervical spine is tender to palpation over paraspinal muscles and with the ROM  Lungs:  normal respiratory effort, no intercostal retractions or use of accessory muscles; normal breath sounds bilaterally - no crackles and no wheezes.    Heart:  normal rate, regular rhythm, no murmur, and no rub. BLE without edema. Abdomen:  soft, normal bowel sounds, no distention, no masses, no guarding, no hepatomegaly, and no splenomegaly.  Tender suprapubially Msk:  normal ROM, no joint tenderness, no joint swelling, and no joint warmth.   Lumbar-sacral spine is tender to palpation over paraspinal muscles and painfull with the ROM  Neurologic:  No cranial nerve deficits noted. Station and gait are normal. Plantar reflexes are down-going bilaterally. DTRs are symmetrical throughout. Sensory, motor and coordinative functions appear intact. Skin:  pale maculas on neck/trunk   Impression & Recommendations:  Problem #  1:  UTI (ICD-599.0) Assessment New  Her updated medication list for this problem includes:    Ciprofloxacin Hcl 500 Mg Tabs (Ciprofloxacin hcl) .Marland Kitchen... 1 by mouth bid  Orders: Rocephin  250mg  (G4010) Admin of Therapeutic Inj  intramuscular or subcutaneous (27253) TLB-BMP (Basic Metabolic Panel-BMET) (80048-METABOL) TLB-CBC Platelet - w/Differential (85025-CBCD) TLB-Udip ONLY (81003-UDIP)  Problem # 2:  FEVER UNSPECIFIED (ICD-780.60) Assessment: New  Orders: TLB-BMP (Basic Metabolic Panel-BMET)  (80048-METABOL) TLB-CBC Platelet - w/Differential (85025-CBCD) TLB-Udip ONLY (81003-UDIP)  Problem # 3:  LOW BACK PAIN, CHRONIC (ICD-724.2)  Her updated medication list for this problem includes:    Vimovo 500-20 Mg Tbec (Naproxen-esomeprazole) .Marland Kitchen... 1 by mouth once daily - two times a day pc as needed pain    Tramadol Hcl 50 Mg Tabs (Tramadol hcl) .Marland Kitchen... 1-2 tabs by mouth two times a day as needed pain  Orders: TLB-BMP (Basic Metabolic Panel-BMET) (80048-METABOL) TLB-CBC Platelet - w/Differential (85025-CBCD) TLB-Udip ONLY (81003-UDIP)  Problem # 4:  ABDOMINAL PAIN, GENERALIZED (ICD-789.07) Assessment: New See "Patient Instructions".   Complete Medication List: 1)  Ativan 1 Mg Tabs (Lorazepam) .Marland Kitchen.. 1 tab 2  times a day 2)  Wellbutrin Sr 150 Mg Tb12 (Bupropion hcl) .Marland Kitchen.. 1 bid 3)  Gabapentin 300 Mg Caps (Gabapentin) .... 2 by mouth two times a day prn 4)  Triamcinolone Acetonide 0.5 % Crea (Triamcinolone acetonide) .... Apply bid to affected area 5)  Fluticasone Propionate 50 Mcg/act Susp (Fluticasone propionate) .... 2 sprays each nostril once daily 6)  Coreg 12.5 Mg Tabs (Carvedilol) .Marland Kitchen.. 1 by mouth bid 7)  Spironolactone 25 Mg Tabs (Spironolactone) .Marland Kitchen.. 1 by mouth qam 8)  Vitamin D3 1000 Unit Tabs (Cholecalciferol) .Marland Kitchen.. 1 by mouth daily 9)  Vimovo 500-20 Mg Tbec (Naproxen-esomeprazole) .Marland Kitchen.. 1 by mouth once daily - two times a day pc as needed pain 10)  Tramadol Hcl 50 Mg Tabs (Tramadol hcl) .Marland Kitchen.. 1-2 tabs by mouth two times a day as needed pain 11)  Ciprofloxacin Hcl 500 Mg Tabs (Ciprofloxacin hcl) .Marland Kitchen.. 1 by mouth bid  Patient Instructions: 1)  Call if you are not better in a reasonable amount of time or if worse. Go to ER if feeling really bad!  Prescriptions: CIPROFLOXACIN HCL 500 MG TABS (CIPROFLOXACIN HCL) 1 by mouth bid  #20 x 1   Entered and Authorized by:   Tresa Garter MD   Signed by:   Tresa Garter MD on 10/29/2009   Method used:   Electronically to         [facility] # [phone]* (retail)       [address]       [place], [place]  [address]       Ph: [phone]       Fax: [phone]   RxID:   [id number]    Medication Administration  Injection # 1:    Medication: Rocephin  250mg     Diagnosis: UTI (ICD-599.0)    Route: IM    Site: RUOQ gluteus    Exp Date: 03/10/2012    Lot #: [id number]    Mfr: Sandoz    Comments: pt rec 1 GM    Patient tolerated injection without complications    Given by: Lanier Prude, CMA(AAMA) (October 29, 2009 4:59 PM)  Orders Added: 1)  Rocephin  250mg  [J0696] 2)  Admin of Therapeutic Inj  intramuscular or subcutaneous [96372] 3)  TLB-BMP (Basic Metabolic Panel-BMET) [80048-METABOL] 4)  TLB-CBC Platelet - w/Differential [85025-CBCD] 5)  TLB-Udip ONLY [81003-UDIP] 6)  Est. Patient Level IV [91478]

## 2010-04-11 NOTE — Miscellaneous (Signed)
Summary: mammogram  2011   Clinical Lists Changes  Observations: Added new observation of MAMMOGRAM: normal (12/31/2009 11:53)      Preventive Care Screening  Mammogram:    Date:  12/31/2009    Results:  normal

## 2010-04-11 NOTE — Assessment & Plan Note (Signed)
Summary: ?uti/offered sda - pt refused/cd   Vital Signs:  Patient profile:   60 year old female Height:      65 inches Weight:      230.50 pounds BMI:     38.50 O2 Sat:      97 % on Room air Temp:     98.4 degrees F oral Pulse rate:   88 / minute BP sitting:   124 / 76  (right arm)  Vitals Entered By: Lucious Groves (June 11, 2009 2:37 PM)  O2 Flow:  Room air CC: Est pt ? UTI due to frequency of urination and tingling. Pt would also like to have rash checked and discuss BP med./kb Is Patient Diabetic? No Pain Assessment Patient in pain? no        Primary Care Provider:  Tresa Garter MD  CC:  Est pt ? UTI due to frequency of urination and tingling. Pt would also like to have rash checked and discuss BP med./kb.  History of Present Illness: C/o frequent urination, burning x 2 wks F/u HTN C/o rash  Current Medications (verified): 1)  Ativan 1 Mg  Tabs (Lorazepam) .Marland Kitchen.. 1 Tab 2  Times A Day 2)  Wellbutrin Sr 150 Mg Tb12 (Bupropion Hcl) .Marland Kitchen.. 1 Bid 3)  Gabapentin 300 Mg  Caps (Gabapentin) .... 2 By Mouth Two Times A Day Prn 4)  Triamcinolone Acetonide 0.5 % Crea (Triamcinolone Acetonide) .... Apply Bid To Affected Area 5)  Fluticasone Propionate 50 Mcg/act  Susp (Fluticasone Propionate) .... 2 Sprays Each Nostril Once Daily 6)  Coreg 12.5 Mg Tabs (Carvedilol) .Marland Kitchen.. 1 By Mouth Bid 7)  Spironolactone 25 Mg Tabs (Spironolactone) .Marland Kitchen.. 1 By Mouth Qam 8)  Vitamin D3 1000 Unit  Tabs (Cholecalciferol) .Marland Kitchen.. 1 By Mouth Daily  Allergies (verified): 1)  Benazepril Hcl (Benazepril Hcl) 2)  Amlodipine Besylate (Amlodipine Besylate)  Past History:  Past Medical History: Last updated: 07/28/2008 Anxiety Depression Peripheral neuropathy, feet; unknown etiol. Elev. 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  The patient denies  fever, dyspnea on exertion, and abdominal pain.    Physical Exam  General:  overweight-appearing.  alert, well-developed, well-nourished, and cooperative to examination.    Ears:  External ear exam shows no significant lesions or deformities.  Otoscopic examination reveals clear canals, tympanic membranes are intact bilaterally without bulging, retraction, inflammation or discharge. Hearing is grossly normal bilaterally. Nose:  External nasal examination shows no deformity or inflammation. Nasal mucosa are pink and moist without lesions or exudates. Mouth:  WNL Lungs:  normal respiratory effort, no intercostal retractions or use of accessory muscles; normal breath sounds bilaterally - no crackles and no wheezes.    Heart:  normal rate, regular rhythm, no murmur, and no rub. BLE without edema. Abdomen:  soft, non-tender, normal bowel sounds, no distention, no masses, no guarding, no hepatomegaly, and no splenomegaly.   Msk:  normal ROM, no joint tenderness, no joint swelling, and no joint warmth.   Extremities:  No clubbing, cyanosis, edema, or deformity noted with normal full range of motion of all joints.   Neurologic:  No cranial nerve deficits noted. Station and gait are normal. Plantar reflexes are down-going bilaterally. DTRs are symmetrical throughout. Sensory, motor and coordinative functions appear intact. Skin:  pale maculas on neck/trunk Psych:  Oriented X3, memory intact for recent and  remote, normally interactive, good eye contact, not anxious appearing, not depressed appearing, and not agitated.      Impression & Recommendations:  Problem # 1:  CYSTITIS (ICD-595.9) Assessment New  Her updated medication list for this problem includes:    Ciprofloxacin Hcl 250 Mg Tabs (Ciprofloxacin hcl) .Marland Kitchen... 1 by mouth two times a day for cystitis  Orders: TLB-Udip ONLY (81003-UDIP)  Problem # 2:  ANXIETY (ICD-300.00) Assessment: Unchanged  Her updated medication list for this problem  includes:    Ativan 1 Mg Tabs (Lorazepam) .Marland Kitchen... 1 tab 2  times a day    Wellbutrin Sr 150 Mg Tb12 (Bupropion hcl) .Marland Kitchen... 1 bid  Problem # 3:  HYPERTENSION (ICD-401.1) Assessment: Unchanged  Her updated medication list for this problem includes:    Coreg 12.5 Mg Tabs (Carvedilol) .Marland Kitchen... 1 by mouth bid    Spironolactone 25 Mg Tabs (Spironolactone) .Marland Kitchen... 1 by mouth qam  BP today: 124/76 Prior BP: 112/68 (05/14/2009)  Prior 10 Yr Risk Heart Disease: Not enough information (12/18/2008)  Labs Reviewed: K+: 4.8 (02/16/2009) Creat: : 0.9 (02/16/2009)     Problem # 4:  TINEA CORPORIS (ICD-110.5) Assessment: New Diflucan later  Complete Medication List: 1)  Ativan 1 Mg Tabs (Lorazepam) .Marland Kitchen.. 1 tab 2  times a day 2)  Wellbutrin Sr 150 Mg Tb12 (Bupropion hcl) .Marland Kitchen.. 1 bid 3)  Gabapentin 300 Mg Caps (Gabapentin) .... 2 by mouth two times a day prn 4)  Triamcinolone Acetonide 0.5 % Crea (Triamcinolone acetonide) .... Apply bid to affected area 5)  Fluticasone Propionate 50 Mcg/act Susp (Fluticasone propionate) .... 2 sprays each nostril once daily 6)  Coreg 12.5 Mg Tabs (Carvedilol) .Marland Kitchen.. 1 by mouth bid 7)  Spironolactone 25 Mg Tabs (Spironolactone) .Marland Kitchen.. 1 by mouth qam 8)  Vitamin D3 1000 Unit Tabs (Cholecalciferol) .Marland Kitchen.. 1 by mouth daily 9)  Ciprofloxacin Hcl 250 Mg Tabs (Ciprofloxacin hcl) .Marland Kitchen.. 1 by mouth two times a day for cystitis 10)  Diflucan 100 Mg Tabs (Fluconazole) .... 2 by mouth on day 1 then 1 by mouth once daily x 2 wks  Patient Instructions: 1)  Call if you are not better in a reasonable amount of time or if worse.  2)  Keep return office visit  Prescriptions: COREG 12.5 MG TABS (CARVEDILOL) 1 by mouth bid  #90 x 3   Entered and Authorized by:   Tresa Garter MD   Signed by:   Tresa Garter MD on 06/11/2009   Method used:   Print then Give to Patient   RxID:   [id number] SPIRONOLACTONE 25 MG TABS (SPIRONOLACTONE) 1 by mouth qam  #90 x 3   Entered and  Authorized by:   Tresa Garter MD   Signed by:   Tresa Garter MD on 06/11/2009   Method used:   Print then Give to Patient   RxID:   [id number] DIFLUCAN 100 MG TABS (FLUCONAZOLE) 2 by mouth on day 1 then 1 by mouth once daily x 2 wks  #15 x 0   Entered and Authorized by:   Tresa Garter MD   Signed by:   Tresa Garter MD on 06/11/2009   Method used:   Electronically to        [facility] # [phone]* (retail)       [address]       [place], [place]  [address]  Ph: [phone]       Fax: [phone]   RxID:   [id number] CIPROFLOXACIN HCL 250 MG TABS (CIPROFLOXACIN HCL) 1 by mouth two times a day for cystitis  #10 x 0   Entered and Authorized by:   Tresa Garter MD   Signed by:   Tresa Garter MD on 06/11/2009   Method used:   Electronically to        [facility] # [phone]* (retail)       [address]       [place], [place]  [address]       Ph: [phone]       Fax: [phone]   RxID:   [id number]

## 2010-04-17 ENCOUNTER — Ambulatory Visit: Payer: Self-pay | Admitting: Internal Medicine

## 2010-05-08 ENCOUNTER — Ambulatory Visit: Payer: Self-pay | Admitting: Internal Medicine

## 2010-07-25 ENCOUNTER — Other Ambulatory Visit: Payer: Self-pay | Admitting: *Deleted

## 2010-07-25 ENCOUNTER — Telehealth: Payer: Self-pay | Admitting: *Deleted

## 2010-07-25 MED ORDER — SPIRONOLACTONE 25 MG PO TABS: 25.0000 mg | ORAL_TABLET | Freq: Every day | ORAL | Status: DC

## 2010-07-25 NOTE — Telephone Encounter (Signed)
rec rf req for Ativan 1 mg. 1 po bid # 60. Last filled 05-15-10. Ok to Rf?

## 2010-07-25 NOTE — Telephone Encounter (Signed)
OK to fill this prescription with additional refills x2 Thank you!  

## 2010-07-26 MED ORDER — LORAZEPAM 1 MG PO TABS: 1.0000 mg | ORAL_TABLET | Freq: Two times a day (BID) | ORAL | Status: DC

## 2010-07-29 ENCOUNTER — Other Ambulatory Visit: Payer: Self-pay | Admitting: Internal Medicine

## 2010-08-26 ENCOUNTER — Telehealth: Payer: Self-pay | Admitting: *Deleted

## 2010-08-26 NOTE — Telephone Encounter (Signed)
Noted. I only see our Lorazepam Rx given in May. Thx!

## 2010-08-26 NOTE — Telephone Encounter (Signed)
Calling to advise they have found that patient has been receiving Oxycontin, Oxycodone, Nucynta, Lorazepam and alprazolam from 2 other prescibers besides Dr. Posey Rea. They have been treating patient for acute renal failure believed to be caused by dehydration and abusing the above drugs.

## 2010-11-20 ENCOUNTER — Ambulatory Visit (INDEPENDENT_AMBULATORY_CARE_PROVIDER_SITE_OTHER): Payer: BC Managed Care – PPO | Admitting: Internal Medicine

## 2010-11-20 ENCOUNTER — Encounter: Payer: Self-pay | Admitting: Internal Medicine

## 2010-11-20 DIAGNOSIS — J309 Allergic rhinitis, unspecified: Secondary | ICD-10-CM

## 2010-11-20 DIAGNOSIS — G4700 Insomnia, unspecified: Secondary | ICD-10-CM | POA: Insufficient documentation

## 2010-11-20 DIAGNOSIS — J329 Chronic sinusitis, unspecified: Secondary | ICD-10-CM

## 2010-11-20 MED ORDER — VITAMIN D 1000 UNITS PO TABS: 1000.0000 [IU] | ORAL_TABLET | Freq: Every day | ORAL | Status: DC

## 2010-11-20 MED ORDER — TEMAZEPAM 15 MG PO CAPS: 15.0000 mg | ORAL_CAPSULE | Freq: Every evening | ORAL | Status: AC | PRN

## 2010-11-20 MED ORDER — AMOXICILLIN 500 MG PO CAPS: 1000.0000 mg | ORAL_CAPSULE | Freq: Two times a day (BID) | ORAL | Status: AC

## 2010-11-20 MED ORDER — FLUTICASONE PROPIONATE 50 MCG/ACT NA SUSP: 2.0000 | Freq: Every day | NASAL | Status: DC

## 2010-11-20 NOTE — Assessment & Plan Note (Signed)
Amox x 10 d -- see meds

## 2010-11-20 NOTE — Assessment & Plan Note (Signed)
Restoril prn 

## 2010-11-20 NOTE — Progress Notes (Signed)
  Subjective:    Patient ID: Meagan Garcia, female    DOB: 01/03/1951, 60 y.o.   MRN: [id number]  HPI  C/o HA and sinus sx's with d/c x 3-4 d, chills C/o insomnia   Review of Systems  Constitutional: Positive for fever and fatigue.  HENT: Positive for ear pain, congestion, rhinorrhea and postnasal drip.   Respiratory: Negative for cough and shortness of breath.   Musculoskeletal: Negative for joint swelling.       Objective:   Physical Exam  Constitutional: She appears well-developed and well-nourished. No distress.  HENT:  Head: Normocephalic.  Mouth/Throat: Oropharynx is clear and moist.       Nasal yellow d/c  Eyes: Conjunctivae are normal. Pupils are equal, round, and reactive to light. Right eye exhibits no discharge. Left eye exhibits no discharge.  Neck: Normal range of motion. Neck supple. No JVD present. No tracheal deviation present. No thyromegaly present.  Cardiovascular: Normal rate, regular rhythm and normal heart sounds.   Pulmonary/Chest: No stridor. No respiratory distress. She has no wheezes.  Abdominal: Soft. Bowel sounds are normal. She exhibits no distension and no mass. There is no tenderness. There is no rebound and no guarding.  Musculoskeletal: She exhibits no edema and no tenderness.  Lymphadenopathy:    She has no cervical adenopathy.  Neurological: She displays normal reflexes. No cranial nerve deficit. She exhibits normal muscle tone. Coordination normal.  Skin: No rash noted. No erythema.  Psychiatric: She has a normal mood and affect. Her behavior is normal. Judgment and thought content normal.          Assessment & Plan:

## 2010-11-20 NOTE — Assessment & Plan Note (Signed)
Worse. Restart spray - see Meds

## 2011-02-03 ENCOUNTER — Telehealth: Payer: Self-pay | Admitting: *Deleted

## 2011-02-03 MED ORDER — LORAZEPAM 1 MG PO TABS: 1.0000 mg | ORAL_TABLET | Freq: Two times a day (BID) | ORAL | Status: DC

## 2011-02-03 NOTE — Telephone Encounter (Signed)
OK to fill this prescription with additional refills x2 Thank you!  

## 2011-02-03 NOTE — Telephone Encounter (Signed)
Left detailed mess informing pt Rf phoned in.

## 2011-02-03 NOTE — Telephone Encounter (Signed)
Pt req rf on Lorazepam 1 mg 1 po bid. Ok to rf?

## 2011-03-26 ENCOUNTER — Ambulatory Visit (INDEPENDENT_AMBULATORY_CARE_PROVIDER_SITE_OTHER): Payer: BC Managed Care – PPO | Admitting: Internal Medicine

## 2011-03-26 ENCOUNTER — Encounter: Payer: Self-pay | Admitting: Internal Medicine

## 2011-03-26 DIAGNOSIS — F411 Generalized anxiety disorder: Secondary | ICD-10-CM

## 2011-03-26 DIAGNOSIS — L723 Sebaceous cyst: Secondary | ICD-10-CM

## 2011-03-26 DIAGNOSIS — F4321 Adjustment disorder with depressed mood: Secondary | ICD-10-CM | POA: Insufficient documentation

## 2011-03-26 DIAGNOSIS — F329 Major depressive disorder, single episode, unspecified: Secondary | ICD-10-CM

## 2011-03-26 DIAGNOSIS — F4320 Adjustment disorder, unspecified: Secondary | ICD-10-CM | POA: Insufficient documentation

## 2011-03-26 DIAGNOSIS — I10 Essential (primary) hypertension: Secondary | ICD-10-CM

## 2011-03-26 MED ORDER — BUPROPION HCL ER (SR) 150 MG PO TB12: 150.0000 mg | ORAL_TABLET | Freq: Two times a day (BID) | ORAL | Status: DC

## 2011-03-26 MED ORDER — CARVEDILOL 12.5 MG PO TABS: 12.5000 mg | ORAL_TABLET | Freq: Two times a day (BID) | ORAL | Status: DC

## 2011-03-26 MED ORDER — FLUTICASONE PROPIONATE 50 MCG/ACT NA SUSP: 2.0000 | Freq: Every day | NASAL | Status: DC

## 2011-03-26 MED ORDER — SPIRONOLACTONE 25 MG PO TABS: 25.0000 mg | ORAL_TABLET | Freq: Every day | ORAL | Status: DC

## 2011-03-26 MED ORDER — LORAZEPAM 1 MG PO TABS: 1.0000 mg | ORAL_TABLET | Freq: Two times a day (BID) | ORAL | Status: DC

## 2011-03-26 NOTE — Assessment & Plan Note (Signed)
Continue with current prescription therapy as reflected on the Med list.  

## 2011-03-26 NOTE — Assessment & Plan Note (Signed)
L shoulder - chronic inflamation Will excise

## 2011-03-26 NOTE — Progress Notes (Signed)
  Subjective:    Patient ID: Meagan Garcia, female    DOB: 07/17/1950, 61 y.o.   MRN: [id number]  HPI  The patient presents for a follow-up of  chronic hypertension, depression, anxiety C/o grief - mother just died C/o skin spot - L shoulder x3 years off and on itching, swelling   Review of Systems  Constitutional: Negative for fever, chills, activity change, appetite change, fatigue and unexpected weight change.  HENT: Negative for congestion, mouth sores and sinus pressure.   Eyes: Negative for visual disturbance.  Respiratory: Negative for cough and chest tightness.   Gastrointestinal: Negative for nausea and abdominal pain.  Genitourinary: Negative for frequency, difficulty urinating and vaginal pain.  Musculoskeletal: Negative for back pain and gait problem.  Skin: Negative for pallor, rash and wound.  Neurological: Negative for dizziness, tremors, weakness, numbness and headaches.  Psychiatric/Behavioral: Positive for sleep disturbance. Negative for suicidal ideas and confusion.       Objective:   Physical Exam  Constitutional: She appears well-developed. No distress.       Obese Sad  HENT:  Head: Normocephalic.  Right Ear: External ear normal.  Left Ear: External ear normal.  Nose: Nose normal.  Mouth/Throat: Oropharynx is clear and moist.  Eyes: Conjunctivae are normal. Pupils are equal, round, and reactive to light. Right eye exhibits no discharge. Left eye exhibits no discharge.  Neck: Normal range of motion. Neck supple. No JVD present. No tracheal deviation present. No thyromegaly present.  Cardiovascular: Normal rate, regular rhythm and normal heart sounds.   Pulmonary/Chest: No stridor. No respiratory distress. She has no wheezes.  Abdominal: Soft. Bowel sounds are normal. She exhibits no distension and no mass. There is no tenderness. There is no rebound and no guarding.  Musculoskeletal: She exhibits no edema and no tenderness.  Lymphadenopathy:    She  has no cervical adenopathy.  Neurological: She displays normal reflexes. No cranial nerve deficit. She exhibits normal muscle tone. Coordination normal.  Skin: No rash noted. No erythema.       1 cm cyst and scar over it  Psychiatric: She has a normal mood and affect. Her behavior is normal. Judgment and thought content normal.          Assessment & Plan:

## 2011-03-26 NOTE — Assessment & Plan Note (Signed)
Discussed.

## 2011-04-16 ENCOUNTER — Encounter: Payer: Self-pay | Admitting: Internal Medicine

## 2011-04-16 ENCOUNTER — Ambulatory Visit (INDEPENDENT_AMBULATORY_CARE_PROVIDER_SITE_OTHER): Payer: BC Managed Care – PPO | Admitting: Internal Medicine

## 2011-04-16 VITALS — BP 120/80 | HR 80 | Temp 98.0°F | Resp 16 | Wt 226.0 lb

## 2011-04-16 DIAGNOSIS — J329 Chronic sinusitis, unspecified: Secondary | ICD-10-CM

## 2011-04-16 MED ORDER — AZITHROMYCIN 250 MG PO TABS: ORAL_TABLET | ORAL | Status: DC

## 2011-04-16 NOTE — Patient Instructions (Signed)
Use over-the-counter  "cold" medicines  such as  "Afrin" nasal spray for nasal congestion as directed instead. Use" Delsym" or" Robitussin" cough syrup varietis for cough.  You can use plain "Tylenol" or "Advi"l for fever, chills and achyness.   "Common cold" symptoms are usually triggered by a virus.  The antibiotics are usually not necessary. On average, a" viral cold" illness would take 4-7 days to resolve. Please, make an appointment if you are not better or if you're worse.  

## 2011-04-16 NOTE — Assessment & Plan Note (Signed)
Z pac See other Meds

## 2011-04-16 NOTE — Progress Notes (Signed)
  Subjective:    Patient ID: Meagan Garcia, female    DOB: 1950/09/03, 61 y.o.   MRN: [id number]  HPI   HPI  C/o URI sx's x  5 days. C/o ST, cough, weakness. Not better with OTC medicines. Actually, the patient is getting worse. The patient did not sleep well last night due to cough.  Review of Systems  Constitutional: Positive for fever, chills and fatigue.  HENT: Positive for congestion, rhinorrhea, sneezing and postnasal drip.   Eyes: Positive for photophobia and pain. Negative for discharge and visual disturbance.  Respiratory: Positive for cough and wheezing.    Gastrointestinal: Negative for vomiting, abdominal pain, diarrhea and abdominal distention.  Genitourinary: Negative for dysuria and difficulty urinating.  Skin: Negative for rash.  Neurological: Positive for dizziness, weakness and light-headedness.      Review of Systems     Objective:   Physical Exam  Constitutional: She appears well-developed. No distress.  HENT:  Head: Normocephalic.  Right Ear: External ear normal.  Left Ear: External ear normal.  Nose: Nose normal.       eryth throat  Eyes: Conjunctivae are normal. Pupils are equal, round, and reactive to light. Right eye exhibits no discharge. Left eye exhibits no discharge.  Neck: Normal range of motion. Neck supple. No JVD present. No tracheal deviation present. No thyromegaly present.  Cardiovascular: Normal rate, regular rhythm and normal heart sounds.   Pulmonary/Chest: No stridor. No respiratory distress. She has no wheezes.  Abdominal: Soft. Bowel sounds are normal. She exhibits no distension and no mass. There is no tenderness. There is no rebound and no guarding.  Musculoskeletal: She exhibits no edema and no tenderness.  Lymphadenopathy:    She has no cervical adenopathy.  Neurological: She displays normal reflexes. No cranial nerve deficit. She exhibits normal muscle tone. Coordination normal.  Skin: No rash noted. No erythema.    Psychiatric: She has a normal mood and affect. Her behavior is normal. Judgment and thought content normal.          Assessment & Plan:  Off work 2/4 - to work 2/7

## 2011-05-12 ENCOUNTER — Encounter: Payer: Self-pay | Admitting: Internal Medicine

## 2011-05-12 ENCOUNTER — Ambulatory Visit (INDEPENDENT_AMBULATORY_CARE_PROVIDER_SITE_OTHER): Payer: BC Managed Care – PPO | Admitting: Internal Medicine

## 2011-05-12 VITALS — BP 120/88 | HR 82 | Temp 98.1°F | Ht 65.0 in | Wt 227.8 lb

## 2011-05-12 DIAGNOSIS — J019 Acute sinusitis, unspecified: Secondary | ICD-10-CM

## 2011-05-12 DIAGNOSIS — R05 Cough: Secondary | ICD-10-CM

## 2011-05-12 MED ORDER — HYDROCOD POLST-CHLORPHEN POLST 10-8 MG/5ML PO LQCR: 5.0000 mL | Freq: Every evening | ORAL | Status: DC | PRN

## 2011-05-12 MED ORDER — AMOXICILLIN-POT CLAVULANATE 875-125 MG PO TABS: 1.0000 | ORAL_TABLET | Freq: Two times a day (BID) | ORAL | Status: DC

## 2011-05-12 NOTE — Patient Instructions (Signed)
It was good to see you today. Augmentin antibiotics 2x/ day x 1 week - also prescription cough syrup at bedtime as needed Your prescription(s) have been submitted to your pharmacy. Please take as directed and contact our office if you believe you are having problem(s) with the medication(s). Continue to use Afrin nasal spray for up to 7 days, Sudafed for congestion as needed, use Mucinex or Robitussin for daytime cough symptoms as needed Alternate between ibuprofen and tylenol for aches, pain and fever symptoms as discussed

## 2011-05-12 NOTE — Progress Notes (Signed)
  Subjective:    HPI  complains of head cold and sinus symptoms  Onset >1 week ago, gradually progressive symptoms  Initially associated with rhinorrhea, sneezing, sore throat, mild headache and low grade fever Also myalgias, sinus pressure and mild chest congestion No relief with OTC meds Precipitated by sick contacts  Past Medical History  Diagnosis Date  . Anxiety   . Depression   . Peripheral neuropathy     feet: unknown etiol  . Elevated glucose   . Allergic rhinitis   . GERD (gastroesophageal reflux disease)     Review of Systems Constitutional: No night sweats, no unexpected weight change Pulmonary: No pleurisy or hemoptysis Cardiovascular: No chest pain or palpitations     Objective:   Physical Exam BP 120/88  Pulse 82  Temp(Src) 98.1 F (36.7 C) (Oral)  Ht 5\' 5"  (1.651 m)  Wt 227 lb 12.8 oz (103.329 kg)  BMI 37.91 kg/m2  SpO2 98% GEN: mildly ill appearing and audible head>chest congestion HENT: NCAT, mild sinus tenderness bilaterally, nares with clear discharge, oropharynx mild erythema, no exudate Eyes: Vision grossly intact, no conjunctivitis Lungs: Clear to auscultation without rhonchi or wheeze, no increased work of breathing Cardiovascular: Regular rate and rhythm, no bilateral edema      Assessment & Plan:  Viral URI >> acute on chronic sinusitis  Cough, postnasal drip related to above   Empiric antibiotics prescribed due to symptom duration greater than 7 days Prescription cough suppression - new prescriptions done Symptomatic care with Tylenol or Advil, hydration and rest -  salt gargle advised as needed

## 2011-05-21 ENCOUNTER — Emergency Department (INDEPENDENT_AMBULATORY_CARE_PROVIDER_SITE_OTHER)
Admission: EM | Admit: 2011-05-21 | Discharge: 2011-05-21 | Disposition: A | Discharge status: Home / Self Care | Payer: BC Managed Care – PPO | Source: Home / Self Care | Attending: Emergency Medicine | Admitting: Emergency Medicine

## 2011-05-21 ENCOUNTER — Emergency Department (INDEPENDENT_AMBULATORY_CARE_PROVIDER_SITE_OTHER): Payer: BC Managed Care – PPO

## 2011-05-21 ENCOUNTER — Encounter (HOSPITAL_COMMUNITY): Payer: Self-pay

## 2011-05-21 DIAGNOSIS — S6390XA Sprain of unspecified part of unspecified wrist and hand, initial encounter: Secondary | ICD-10-CM

## 2011-05-21 DIAGNOSIS — X500XXA Overexertion from strenuous movement or load, initial encounter: Secondary | ICD-10-CM

## 2011-05-21 MED ORDER — HYDROCODONE-ACETAMINOPHEN 5-325 MG PO TABS: 2.0000 | ORAL_TABLET | ORAL | Status: AC | PRN

## 2011-05-21 MED ORDER — IBUPROFEN 600 MG PO TABS: 600.0000 mg | ORAL_TABLET | Freq: Four times a day (QID) | ORAL | Status: AC | PRN

## 2011-05-21 NOTE — ED Provider Notes (Addendum)
History     CSN: [id number]  Arrival date & time 05/21/11  1410   First MD Initiated Contact with Patient 05/21/11 1415      Chief Complaint  Patient presents with  . Hand Injury    (Consider location/radiation/quality/duration/timing/severity/associated sxs/prior treatment) HPI Comments: Patient is a right-handed female who states that she was pushing herself up  In bed on the knuckles of her hands, and hyperflexed her right wrist. Patient states she heard a "pop" at the third MCP joint. Now with pain, redness, swelling in this area. No limitation of motion, no bruising, weakness, paresthesias, gross deformity. Patient's been applying ice with some relief.  Patient is a 61 y.o. female presenting with hand injury. The history is provided by the patient. No language interpreter was used.  Hand Injury  The incident occurred yesterday. The incident occurred at home. The injury mechanism was a direct blow. The pain is present in the right hand. The quality of the pain is described as aching. The pain has been constant since the incident. Pertinent negatives include no fever. The symptoms are aggravated by palpation. She has tried ice for the symptoms. The treatment provided mild relief.    Past Medical History  Diagnosis Date  . Anxiety   . Depression   . Peripheral neuropathy     feet: unknown etiol  . Elevated glucose   . Allergic rhinitis   . GERD (gastroesophageal reflux disease)     Past Surgical History  Procedure Date  . Abdominal hysterectomy     partial    Family History  Problem Relation Age of Onset  . Hypertension Other   . Hypertension Mother   . Cancer Mother 86    lung ca  . Cancer Father     History  Substance Use Topics  . Smoking status: Former Games developer  . Smokeless tobacco: Not on file  . Alcohol Use: No    OB History    Grav Para Term Preterm Abortions TAB SAB Ect Mult Living                  Review of Systems  Constitutional: Negative for  fever.  Gastrointestinal: Negative for nausea and vomiting.  Musculoskeletal: Positive for joint swelling.  Skin: Positive for color change.  Neurological: Negative for weakness.    Allergies  Review of patient's allergies indicates no known allergies.  Home Medications   Current Outpatient Rx  Name Route Sig Dispense Refill  . BUPROPION HCL ER (SR) 150 MG PO TB12 Oral Take 1 tablet (150 mg total) by mouth 2 (two) times daily. 180 tablet 3  . CARVEDILOL 12.5 MG PO TABS Oral Take 1 tablet (12.5 mg total) by mouth 2 (two) times daily with a meal. 180 tablet 3  . VITAMIN D 1000 UNITS PO TABS Oral Take 1 tablet (1,000 Units total) by mouth daily. 30 tablet 11  . FLUTICASONE PROPIONATE 50 MCG/ACT NA SUSP Nasal Place 2 sprays into the nose daily. 48 g 3  . HYDROCODONE-ACETAMINOPHEN 5-325 MG PO TABS Oral Take 2 tablets by mouth every 4 (four) hours as needed for pain. 20 tablet 0  . IBUPROFEN 600 MG PO TABS Oral Take 1 tablet (600 mg total) by mouth every 6 (six) hours as needed for pain. 30 tablet 0  . LORAZEPAM 1 MG PO TABS Oral Take 1 tablet (1 mg total) by mouth 2 (two) times daily. 180 tablet 0  . SPIRONOLACTONE 25 MG PO TABS Oral Take 1 tablet (  25 mg total) by mouth daily. 90 tablet 3  . TRIAMCINOLONE ACETONIDE 0.5 % EX CREA Topical Apply 1 application topically 2 (two) times daily.        BP 125/84  Pulse 77  Temp(Src) 98.4 F (36.9 C) (Oral)  Resp 17  SpO2 99%  Physical Exam  Nursing note and vitals reviewed. Constitutional: She is oriented to person, place, and time. She appears well-developed and well-nourished. No distress.  HENT:  Head: Normocephalic and atraumatic.  Eyes: Conjunctivae and EOM are normal.  Neck: Normal range of motion.  Cardiovascular: Normal rate.   Pulmonary/Chest: Effort normal.  Abdominal: She exhibits no distension.  Musculoskeletal: Normal range of motion.       Baseline Strength and Sensation to R hand with normal light touch intact for Pt. Hand  with intact motor strength 5/5 flexion / extension / FDP  / FDS   sensation grossly intact in all fingers. Mild redness, swelling, tenderness at third MCP joint. No other metacarpal tenderness. Skin intact. Wrist WNL.    Neurological: She is alert and oriented to person, place, and time.  Skin: Skin is warm and dry.  Psychiatric: She has a normal mood and affect. Her behavior is normal. Judgment and thought content normal.    ED Course  Procedures (including critical care time)  Labs Reviewed - No data to display No results found.   1. Sprain, hand   Dg Hand Complete Right  05/21/2011  *RADIOLOGY REPORT*  Clinical Data: Hand injury  RIGHT HAND - COMPLETE 3+ VIEW  Comparison: None.  Findings: No evidence of fracture or dislocation right hand.  IMPRESSION: No fracture  Original Report Authenticated By: Genevive Bi, M.D.     MDM  X-ray reviewed by myself. No fracture. H&P most consistent with a hand sprain. Place patient volar splint, will send home with NSAIDs, Norco, ice. Will have her follow Dr. Melvyn Novas, hand on call if no improvement in a week to 10 days  Domenick Gong, MD 05/21/11 1611  Domenick Gong, MD 05/21/11 [phone]

## 2011-05-21 NOTE — Discharge Instructions (Signed)
Take the medication as written. Take 1 gram of tylenol with the motrin up to 4 times a day as needed for pain and fever. This is an effective combination for pain. Take the hydrocodone/norco only for severe pain. Do not take the tylenol and hydrocodone/norcoas they both have tylenol in them and too much can hurt your liver. Return if you get worse, have a  fever >100.4, or for any concerns.   Go to www.goodrx.com to look up your medications. This will give you a list of where you can find your prescriptions at the most affordable prices.   

## 2011-05-21 NOTE — Progress Notes (Signed)
Orthopedic Tech Progress Note Patient Details:  Meagan Garcia 1950/10/09 [id number]  Type of Splint: Short Arm Splint Location: (R) UE Splint Interventions: Application    Jennye Moccasin 05/21/2011, 4:02 PM

## 2011-05-21 NOTE — ED Notes (Signed)
States she was pushing herself up in the bed last PM, and wrist did not continue to support her weight, folded up under her weight. C/o pain in her wrist and hand. Pain did not prevent her from sleeping last night; minimal swelling of hand/wrist noted

## 2011-06-12 ENCOUNTER — Ambulatory Visit (INDEPENDENT_AMBULATORY_CARE_PROVIDER_SITE_OTHER): Payer: BC Managed Care – PPO | Admitting: Internal Medicine

## 2011-06-12 ENCOUNTER — Encounter: Payer: Self-pay | Admitting: Internal Medicine

## 2011-06-12 VITALS — BP 132/82 | HR 74 | Temp 97.9°F | Ht 65.0 in | Wt 229.0 lb

## 2011-06-12 DIAGNOSIS — D485 Neoplasm of uncertain behavior of skin: Secondary | ICD-10-CM | POA: Insufficient documentation

## 2011-06-12 NOTE — Assessment & Plan Note (Signed)
See bx procedure note

## 2011-06-12 NOTE — Progress Notes (Signed)
Addended by: Carin Primrose on: 06/12/2011 04:32 PM   Modules accepted: Orders

## 2011-06-12 NOTE — Patient Instructions (Signed)
Postprocedure instructions :    A Band-Aid should be  changed twice daily. You can take a shower tomorrow.  Keep the wounds clean. You can wash them with liquid soap and water. Pat dry with gauze or a Kleenex tissue  Before applying antibiotic ointment and a Band-Aid.   You need to report immediately  if fever, chills or any signs of infection develop.    The biopsy results should be available in 1 -2 weeks. 

## 2011-06-12 NOTE — Progress Notes (Signed)
  Subjective:    Patient ID: Meagan Garcia, female    DOB: 1950/05/17, 61 y.o.   MRN: [id number]  HPI  Skin bx  Review of Systems     Objective:   Physical Exam   8 mm hyperpigmented firm cutan nodule L prox lat arm    Procedure Note :     Procedure :  Skin biopsy   Indication:  Suspicious lesion(s): 8 mm hyperpigmented firm cutan nodule L prox lat arm   Risks including unsuccessful procedure , bleeding, infection, bruising, scar, a need for another complete procedure and others were explained to the patient in detail as well as the benefits. Informed consent was obtained and signed.   The patient was placed in a decubitus position.  Lesion #1 8 mm hyperpigmented firm cutan nodule L prox lat arm   Skin over lesion #1  was prepped with Betadine and alcohol  and anesthetized with 5 cc of 2% lidocaine and epinephrine, using a 25-gauge 1 inch needle.  Excisional biopsy with a sterile Dermablade was carried out in the usual fashion. Specimen was sent to path lab. Incision was closed with 2 vycril and 3 ethylone 5.0 sutures. RTC 11 d   Band-Aid was applied with antibiotic ointment.    Tolerated well. Complications none.      Postprocedure instructions :    A Band-Aid should be  changed twice daily. You can take a shower tomorrow.  Keep the wounds clean. You can wash them with liquid soap and water. Pat dry with gauze or a Kleenex tissue  Before applying antibiotic ointment and a Band-Aid.   You need to report immediately  if fever, chills or any signs of infection develop.    The biopsy results should be available in 1 -2 weeks.      Assessment & Plan:

## 2011-06-16 ENCOUNTER — Encounter: Payer: Self-pay | Admitting: Internal Medicine

## 2011-06-16 ENCOUNTER — Telehealth: Payer: Self-pay | Admitting: Internal Medicine

## 2011-06-16 NOTE — Telephone Encounter (Signed)
Stacey, please, inform patient that her skin bx was benign Thx 

## 2011-06-17 NOTE — Telephone Encounter (Signed)
Left detailed mess informing pt of below.  

## 2011-06-20 ENCOUNTER — Telehealth: Payer: Self-pay | Admitting: *Deleted

## 2011-06-20 NOTE — Telephone Encounter (Signed)
Pt states md removed cyst last Thurs,  The area in now draining, and she has develop little red bumps. ? If she is having a allergic reaction to the antibiotic cream she bought over the counter or band-aid. Pt is schedule for f/u on Monday but want to know what she can do in the meantime.... 06/20/11@8 :54am/LMB

## 2011-06-21 NOTE — Telephone Encounter (Signed)
I called. Allergic to band aid glue. Using HCT cream - better

## 2011-06-23 ENCOUNTER — Ambulatory Visit (INDEPENDENT_AMBULATORY_CARE_PROVIDER_SITE_OTHER): Payer: BC Managed Care – PPO | Admitting: Internal Medicine

## 2011-06-23 ENCOUNTER — Encounter: Payer: Self-pay | Admitting: Internal Medicine

## 2011-06-23 VITALS — BP 110/70 | HR 80 | Temp 98.1°F | Resp 16 | Wt 231.0 lb

## 2011-06-23 DIAGNOSIS — D485 Neoplasm of uncertain behavior of skin: Secondary | ICD-10-CM

## 2011-06-23 DIAGNOSIS — L259 Unspecified contact dermatitis, unspecified cause: Secondary | ICD-10-CM

## 2011-07-06 DIAGNOSIS — L259 Unspecified contact dermatitis, unspecified cause: Secondary | ICD-10-CM | POA: Insufficient documentation

## 2011-07-06 NOTE — Assessment & Plan Note (Signed)
Sutures removed.

## 2011-07-06 NOTE — Progress Notes (Signed)
  Subjective:    Patient ID: Meagan Garcia, female    DOB: [birth year], 61 y.o.   MRN: [id number]  HPI  Suture removal. C/o rash where the band aid was  Review of Systems     Objective:   Physical Exam   Wound looks clean, rash in the form of a bandaid      Assessment & Plan:

## 2011-07-06 NOTE — Assessment & Plan Note (Signed)
4/13 - due to the band aid glue Hydrocort cream bid

## 2011-08-18 ENCOUNTER — Encounter: Payer: Self-pay | Admitting: Internal Medicine

## 2011-08-18 ENCOUNTER — Ambulatory Visit (INDEPENDENT_AMBULATORY_CARE_PROVIDER_SITE_OTHER): Payer: BC Managed Care – PPO | Admitting: Internal Medicine

## 2011-08-18 VITALS — BP 114/70 | HR 88 | Temp 98.6°F | Resp 16 | Wt 233.0 lb

## 2011-08-18 DIAGNOSIS — G4700 Insomnia, unspecified: Secondary | ICD-10-CM

## 2011-08-18 DIAGNOSIS — H1130 Conjunctival hemorrhage, unspecified eye: Secondary | ICD-10-CM

## 2011-08-18 DIAGNOSIS — H6590 Unspecified nonsuppurative otitis media, unspecified ear: Secondary | ICD-10-CM

## 2011-08-18 DIAGNOSIS — J309 Allergic rhinitis, unspecified: Secondary | ICD-10-CM

## 2011-08-18 DIAGNOSIS — H6593 Unspecified nonsuppurative otitis media, bilateral: Secondary | ICD-10-CM

## 2011-08-18 MED ORDER — LORAZEPAM 1 MG PO TABS: 1.0000 mg | ORAL_TABLET | Freq: Two times a day (BID) | ORAL | Status: DC

## 2011-08-18 MED ORDER — LORATADINE 10 MG PO TABS: 10.0000 mg | ORAL_TABLET | Freq: Every day | ORAL | Status: DC

## 2011-08-18 NOTE — Assessment & Plan Note (Signed)
claritin prn  

## 2011-08-18 NOTE — Assessment & Plan Note (Signed)
Continue with current prescription therapy as reflected on the Med list.  

## 2011-08-18 NOTE — Progress Notes (Signed)
  Subjective:    Patient ID: Meagan Garcia, female    DOB: 06/17/1950, 61 y.o.   MRN: [id number]  HPI  C/o L eye blood starting this am, mild pain C/o B ears irritation - itching x 1 mo F/u insomnia  BP Readings from Last 3 Encounters:  08/18/11 114/70  06/23/11 110/70  06/12/11 132/82   Wt Readings from Last 3 Encounters:  08/18/11 233 lb (105.688 kg)  06/23/11 231 lb (104.781 kg)  06/12/11 229 lb (103.874 kg)      Review of Systems  Constitutional: Negative for chills, activity change, appetite change, fatigue and unexpected weight change.  HENT: Negative for hearing loss, ear pain, congestion, mouth sores, sinus pressure and ear discharge.   Eyes: Positive for pain and redness (R medial subconj hemorrhage). Negative for discharge and visual disturbance.  Respiratory: Negative for cough and chest tightness.   Gastrointestinal: Negative for nausea and abdominal pain.  Genitourinary: Negative for frequency, difficulty urinating and vaginal pain.  Musculoskeletal: Negative for back pain and gait problem.  Skin: Negative for pallor and rash.  Neurological: Negative for dizziness, tremors, weakness, numbness and headaches.  Psychiatric/Behavioral: Negative for confusion and sleep disturbance.       Objective:   Physical Exam  Constitutional: She appears well-developed. No distress.       Obese  HENT:  Head: Normocephalic.  Right Ear: External ear normal.  Nose: Nose normal.  Mouth/Throat: Oropharynx is clear and moist.       B TMs w/slight erythema R medial eye with subconj hemorrhage  Eyes: Conjunctivae are normal. Pupils are equal, round, and reactive to light. Right eye exhibits no discharge. Left eye exhibits no discharge.  Neck: Normal range of motion. Neck supple. No JVD present. No tracheal deviation present. No thyromegaly present.  Cardiovascular: Normal rate, regular rhythm and normal heart sounds.   Pulmonary/Chest: No stridor. No respiratory distress.  She has no wheezes.  Abdominal: Soft. Bowel sounds are normal. She exhibits no distension and no mass. There is no tenderness. There is no rebound and no guarding.  Musculoskeletal: She exhibits no edema and no tenderness.  Lymphadenopathy:    She has no cervical adenopathy.  Neurological: She displays normal reflexes. No cranial nerve deficit. She exhibits normal muscle tone. Coordination normal.  Skin: No rash noted. No erythema.  Psychiatric: She has a normal mood and affect. Her behavior is normal. Judgment and thought content normal.    Lab Results  Component Value Date   WBC 11.1* 10/29/2009   HGB 12.1 10/29/2009   HCT 36.5 10/29/2009   PLT 236.0 10/29/2009   GLUCOSE 83 10/29/2009   CHOL 201* 08/20/2009   TRIG 88.0 08/20/2009   HDL 41.50 08/20/2009   LDLDIRECT 149.7 08/20/2009   ALT 22 08/20/2009   AST 19 08/20/2009   NA 137 10/29/2009   K 4.1 10/29/2009   CL 102 10/29/2009   CREATININE 1.0 10/29/2009   BUN 10 10/29/2009   CO2 24 10/29/2009   TSH 1.76 08/20/2009   HGBA1C 5.6 06/24/2007         Assessment & Plan:

## 2011-08-18 NOTE — Assessment & Plan Note (Signed)
R eye 6/13 Will watch

## 2011-09-24 ENCOUNTER — Ambulatory Visit: Payer: BC Managed Care – PPO | Admitting: Internal Medicine

## 2011-11-11 ENCOUNTER — Encounter: Payer: Self-pay | Admitting: Internal Medicine

## 2012-01-09 ENCOUNTER — Other Ambulatory Visit: Payer: Self-pay | Admitting: Internal Medicine

## 2012-01-09 NOTE — Telephone Encounter (Signed)
Last written 08/18/2011 #180 with 0 refills-please advise.

## 2012-01-16 MED ORDER — LORAZEPAM 1 MG PO TABS: ORAL_TABLET | ORAL | Status: DC

## 2012-01-16 NOTE — Telephone Encounter (Signed)
Rx phoned into [facility].

## 2012-01-16 NOTE — Addendum Note (Signed)
Addended by: Carin Primrose on: 01/16/2012 09:33 AM   Modules accepted: Orders

## 2012-02-18 ENCOUNTER — Ambulatory Visit: Payer: BC Managed Care – PPO | Admitting: Internal Medicine

## 2012-04-14 ENCOUNTER — Ambulatory Visit (INDEPENDENT_AMBULATORY_CARE_PROVIDER_SITE_OTHER): Payer: BC Managed Care – PPO | Admitting: Internal Medicine

## 2012-04-14 ENCOUNTER — Encounter: Payer: Self-pay | Admitting: Internal Medicine

## 2012-04-14 VITALS — BP 118/80 | HR 80 | Temp 98.3°F | Resp 16 | Wt 234.0 lb

## 2012-04-14 DIAGNOSIS — E669 Obesity, unspecified: Secondary | ICD-10-CM

## 2012-04-14 DIAGNOSIS — I10 Essential (primary) hypertension: Secondary | ICD-10-CM

## 2012-04-14 DIAGNOSIS — J329 Chronic sinusitis, unspecified: Secondary | ICD-10-CM | POA: Insufficient documentation

## 2012-04-14 DIAGNOSIS — F411 Generalized anxiety disorder: Secondary | ICD-10-CM

## 2012-04-14 DIAGNOSIS — F329 Major depressive disorder, single episode, unspecified: Secondary | ICD-10-CM

## 2012-04-14 MED ORDER — BUPROPION HCL ER (SR) 150 MG PO TB12: 150.0000 mg | ORAL_TABLET | Freq: Two times a day (BID) | ORAL | Status: DC

## 2012-04-14 MED ORDER — SPIRONOLACTONE 25 MG PO TABS: 25.0000 mg | ORAL_TABLET | Freq: Every day | ORAL | Status: DC

## 2012-04-14 MED ORDER — CARVEDILOL 12.5 MG PO TABS: 12.5000 mg | ORAL_TABLET | Freq: Two times a day (BID) | ORAL | Status: DC

## 2012-04-14 MED ORDER — FLUTICASONE PROPIONATE 50 MCG/ACT NA SUSP: 2.0000 | Freq: Every day | NASAL | Status: AC

## 2012-04-14 MED ORDER — LORAZEPAM 1 MG PO TABS: ORAL_TABLET | ORAL | Status: DC

## 2012-04-14 MED ORDER — AZITHROMYCIN 250 MG PO TABS: ORAL_TABLET | ORAL | Status: DC

## 2012-04-14 NOTE — Assessment & Plan Note (Signed)
Continue with current prescription therapy as reflected on the Med list.  

## 2012-04-14 NOTE — Assessment & Plan Note (Signed)
[employer] 

## 2012-04-14 NOTE — Progress Notes (Signed)
   Subjective:    HPI  C/o sinus congestion, ST x 1 wk - yellow d/c C/o B ears irritation  F/u insomnia, depression, anxiety, HTN  BP Readings from Last 3 Encounters:  04/14/12 118/80  08/18/11 114/70  06/23/11 110/70   Wt Readings from Last 3 Encounters:  04/14/12 234 lb (106.142 kg)  08/18/11 233 lb (105.688 kg)  06/23/11 231 lb (104.781 kg)      Review of Systems  Constitutional: Negative for chills, activity change, appetite change, fatigue and unexpected weight change.  HENT: Negative for hearing loss, ear pain, congestion, mouth sores, sinus pressure and ear discharge.   Eyes: Positive for pain and redness (R medial subconj hemorrhage). Negative for discharge and visual disturbance.  Respiratory: Negative for cough and chest tightness.   Gastrointestinal: Negative for nausea and abdominal pain.  Genitourinary: Negative for frequency, difficulty urinating and vaginal pain.  Musculoskeletal: Negative for back pain and gait problem.  Skin: Negative for pallor and rash.  Neurological: Negative for dizziness, tremors, weakness, numbness and headaches.  Psychiatric/Behavioral: Negative for confusion and sleep disturbance.       Objective:   Physical Exam  Constitutional: She appears well-developed. No distress.       Obese  HENT:  Head: Normocephalic.  Right Ear: External ear normal.  Nose: Nose normal.  Mouth/Throat: Oropharynx is clear and moist.       eryth nasal mucosa  Eyes: Conjunctivae normal are normal. Pupils are equal, round, and reactive to light. Right eye exhibits no discharge. Left eye exhibits no discharge.  Neck: Normal range of motion. Neck supple. No JVD present. No tracheal deviation present. No thyromegaly present.  Cardiovascular: Normal rate, regular rhythm and normal heart sounds.   Pulmonary/Chest: No stridor. No respiratory distress. She has no wheezes.  Abdominal: Soft. Bowel sounds are normal. She exhibits no distension and no mass. There  is no tenderness. There is no rebound and no guarding.  Musculoskeletal: She exhibits no edema and no tenderness.  Lymphadenopathy:    She has no cervical adenopathy.  Neurological: She displays normal reflexes. No cranial nerve deficit. She exhibits normal muscle tone. Coordination normal.  Skin: No rash noted. No erythema.  Psychiatric: She has a normal mood and affect. Her behavior is normal. Judgment and thought content normal.    Lab Results  Component Value Date   WBC 11.1* 10/29/2009   HGB 12.1 10/29/2009   HCT 36.5 10/29/2009   PLT 236.0 10/29/2009   GLUCOSE 83 10/29/2009   CHOL 201* 08/20/2009   TRIG 88.0 08/20/2009   HDL 41.50 08/20/2009   LDLDIRECT 149.7 08/20/2009   ALT 22 08/20/2009   AST 19 08/20/2009   NA 137 10/29/2009   K 4.1 10/29/2009   CL 102 10/29/2009   CREATININE 1.0 10/29/2009   BUN 10 10/29/2009   CO2 24 10/29/2009   TSH 1.76 08/20/2009   HGBA1C 5.6 06/24/2007         Assessment & Plan:

## 2012-04-14 NOTE — Assessment & Plan Note (Signed)
Discussed diet  

## 2012-04-14 NOTE — Patient Instructions (Signed)
Wt Readings from Last 3 Encounters:  04/14/12 234 lb (106.142 kg)  08/18/11 233 lb (105.688 kg)  06/23/11 231 lb (104.781 kg)

## 2012-06-14 ENCOUNTER — Telehealth: Payer: Self-pay | Admitting: *Deleted

## 2012-06-14 DIAGNOSIS — Z0000 Encounter for general adult medical examination without abnormal findings: Secondary | ICD-10-CM

## 2012-06-14 NOTE — Telephone Encounter (Signed)
Labs entered.

## 2012-06-14 NOTE — Telephone Encounter (Signed)
Message copied by Merrilyn Puma on Mon Jun 14, 2012  4:28 PM ------      Message from: Etheleen Sia      Created: Wed Apr 14, 2012  4:49 PM      Regarding: LABS       PHYSICAL LABS IN AUG  ------

## 2012-06-21 ENCOUNTER — Encounter: Payer: Self-pay | Admitting: Internal Medicine

## 2012-06-21 ENCOUNTER — Ambulatory Visit (INDEPENDENT_AMBULATORY_CARE_PROVIDER_SITE_OTHER): Payer: BC Managed Care – PPO | Admitting: Internal Medicine

## 2012-06-21 VITALS — BP 120/70 | HR 72 | Temp 98.6°F | Resp 16 | Wt 229.0 lb

## 2012-06-21 DIAGNOSIS — J329 Chronic sinusitis, unspecified: Secondary | ICD-10-CM

## 2012-06-21 DIAGNOSIS — I10 Essential (primary) hypertension: Secondary | ICD-10-CM

## 2012-06-21 MED ORDER — AZITHROMYCIN 250 MG PO TABS: ORAL_TABLET | ORAL | Status: AC

## 2012-06-21 MED ORDER — PROMETHAZINE-CODEINE 6.25-10 MG/5ML PO SYRP: 5.0000 mL | ORAL_SOLUTION | ORAL | Status: DC | PRN

## 2012-06-21 NOTE — Progress Notes (Signed)
   Subjective:    Sinusitis Associated symptoms include congestion and coughing. Pertinent negatives include no chills, ear pain, headaches or sinus pressure.  Cough Associated symptoms include postnasal drip and rhinorrhea. Pertinent negatives include no chills, ear pain, eye redness, headaches or rash.    C/o sinus congestion, ST x  5 d- yellow d/c C/o B ears irritation  F/u insomnia, depression, anxiety, HTN  BP Readings from Last 3 Encounters:  06/21/12 120/70  04/14/12 118/80  08/18/11 114/70   Wt Readings from Last 3 Encounters:  06/21/12 229 lb (103.874 kg)  04/14/12 234 lb (106.142 kg)  08/18/11 233 lb (105.688 kg)      Review of Systems  Constitutional: Negative for chills, activity change, appetite change, fatigue and unexpected weight change.  HENT: Positive for congestion, rhinorrhea and postnasal drip. Negative for hearing loss, ear pain, mouth sores, sinus pressure and ear discharge.   Eyes: Negative for pain, discharge, redness and visual disturbance.  Respiratory: Positive for cough. Negative for chest tightness.   Gastrointestinal: Negative for nausea and abdominal pain.  Genitourinary: Negative for frequency, difficulty urinating and vaginal pain.  Musculoskeletal: Negative for back pain and gait problem.  Skin: Negative for pallor and rash.  Neurological: Negative for dizziness, tremors, weakness, numbness and headaches.  Psychiatric/Behavioral: Negative for confusion and sleep disturbance.       Objective:   Physical Exam  Constitutional: She appears well-developed. No distress.  Obese  HENT:  Head: Normocephalic.  Right Ear: External ear normal.  Nose: Nose normal.  Mouth/Throat: Oropharynx is clear and moist.  eryth nasal mucosa  Eyes: Conjunctivae are normal. Pupils are equal, round, and reactive to light. Right eye exhibits no discharge. Left eye exhibits no discharge.  Neck: Normal range of motion. Neck supple. No JVD present. No tracheal  deviation present. No thyromegaly present.  Cardiovascular: Normal rate, regular rhythm and normal heart sounds.   Pulmonary/Chest: No stridor. No respiratory distress. She has no wheezes.  Abdominal: Soft. Bowel sounds are normal. She exhibits no distension and no mass. There is no tenderness. There is no rebound and no guarding.  Musculoskeletal: She exhibits no edema and no tenderness.  Lymphadenopathy:    She has no cervical adenopathy.  Neurological: She displays normal reflexes. No cranial nerve deficit. She exhibits normal muscle tone. Coordination normal.  Skin: No rash noted. No erythema.  Psychiatric: She has a normal mood and affect. Her behavior is normal. Judgment and thought content normal.    Lab Results  Component Value Date   WBC 11.1* 10/29/2009   HGB 12.1 10/29/2009   HCT 36.5 10/29/2009   PLT 236.0 10/29/2009   GLUCOSE 83 10/29/2009   CHOL 201* 08/20/2009   TRIG 88.0 08/20/2009   HDL 41.50 08/20/2009   LDLDIRECT 149.7 08/20/2009   ALT 22 08/20/2009   AST 19 08/20/2009   NA 137 10/29/2009   K 4.1 10/29/2009   CL 102 10/29/2009   CREATININE 1.0 10/29/2009   BUN 10 10/29/2009   CO2 24 10/29/2009   TSH 1.76 08/20/2009   HGBA1C 5.6 06/24/2007         Assessment & Plan:

## 2012-06-21 NOTE — Assessment & Plan Note (Signed)
See Rx 

## 2012-06-21 NOTE — Assessment & Plan Note (Signed)
Continue with current prescription therapy as reflected on the Med list.  

## 2012-09-24 ENCOUNTER — Encounter: Payer: Self-pay | Admitting: Internal Medicine

## 2012-10-13 ENCOUNTER — Encounter: Payer: BC Managed Care – PPO | Admitting: Internal Medicine

## 2012-12-22 ENCOUNTER — Telehealth: Payer: Self-pay | Admitting: *Deleted

## 2012-12-22 NOTE — Telephone Encounter (Signed)
Rf req for Lorazepam 1 mg 1 po bid. # 180. Last filled 05/20/12. Ok to Rf?

## 2012-12-22 NOTE — Telephone Encounter (Signed)
OK to fill this prescription with additional refills x0. Sch OV pls Thank you!  

## 2012-12-23 MED ORDER — LORAZEPAM 1 MG PO TABS: ORAL_TABLET | ORAL | Status: DC

## 2012-12-23 NOTE — Telephone Encounter (Signed)
Done. Will have schedulers contact pt for OV. 

## 2013-01-04 ENCOUNTER — Encounter: Payer: Self-pay | Admitting: Internal Medicine

## 2013-01-04 ENCOUNTER — Ambulatory Visit (INDEPENDENT_AMBULATORY_CARE_PROVIDER_SITE_OTHER): Payer: BC Managed Care – PPO | Admitting: Internal Medicine

## 2013-01-04 VITALS — BP 102/78 | HR 80 | Temp 98.6°F | Resp 16 | Wt 225.0 lb

## 2013-01-04 DIAGNOSIS — F3289 Other specified depressive episodes: Secondary | ICD-10-CM

## 2013-01-04 DIAGNOSIS — M25569 Pain in unspecified knee: Secondary | ICD-10-CM

## 2013-01-04 DIAGNOSIS — E669 Obesity, unspecified: Secondary | ICD-10-CM

## 2013-01-04 DIAGNOSIS — M25561 Pain in right knee: Secondary | ICD-10-CM

## 2013-01-04 DIAGNOSIS — F329 Major depressive disorder, single episode, unspecified: Secondary | ICD-10-CM

## 2013-01-04 DIAGNOSIS — I10 Essential (primary) hypertension: Secondary | ICD-10-CM

## 2013-01-04 DIAGNOSIS — G4700 Insomnia, unspecified: Secondary | ICD-10-CM

## 2013-01-04 MED ORDER — TRAMADOL HCL 50 MG PO TABS: 50.0000 mg | ORAL_TABLET | Freq: Two times a day (BID) | ORAL | Status: DC | PRN

## 2013-01-04 MED ORDER — NAPROXEN 500 MG PO TABS: 500.0000 mg | ORAL_TABLET | Freq: Two times a day (BID) | ORAL | Status: DC | PRN

## 2013-01-04 NOTE — Assessment & Plan Note (Signed)
Doing well 

## 2013-01-04 NOTE — Assessment & Plan Note (Signed)
No OSA 

## 2013-01-04 NOTE — Assessment & Plan Note (Signed)
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 addiction risk, apnea etc) and complications (i.e. Somnolence, constipation and others) were explained to the patient and were aknowledged.  Getting a second opinion Tramadol, Naproxen, wt loss

## 2013-01-04 NOTE — Assessment & Plan Note (Signed)
Wt Readings from Last 3 Encounters:  01/04/13 225 lb (102.059 kg)  06/21/12 229 lb (103.874 kg)  04/14/12 234 lb (106.142 kg)

## 2013-01-04 NOTE — Progress Notes (Signed)
   Subjective:    HPI  C/o sinus congestion, ST x  5 d- yellow d/c C/o B ears irritation  F/u insomnia, depression, anxiety, HTN  BP Readings from Last 3 Encounters:  01/04/13 102/78  06/21/12 120/70  04/14/12 118/80   Wt Readings from Last 3 Encounters:  01/04/13 225 lb (102.059 kg)  06/21/12 229 lb (103.874 kg)  04/14/12 234 lb (106.142 kg)      Review of Systems  Constitutional: Negative for activity change, appetite change, fatigue and unexpected weight change.  HENT: Negative for ear discharge, hearing loss and mouth sores.   Eyes: Negative for pain, discharge, redness and visual disturbance.  Respiratory: Negative for chest tightness.   Gastrointestinal: Negative for nausea and abdominal pain.  Genitourinary: Negative for frequency, difficulty urinating and vaginal pain.  Musculoskeletal: Negative for back pain and gait problem.  Skin: Negative for pallor.  Neurological: Negative for dizziness, tremors, weakness and numbness.  Psychiatric/Behavioral: Negative for confusion and sleep disturbance.       Objective:   Physical Exam  Constitutional: She appears well-developed. No distress.  Obese  HENT:  Head: Normocephalic.  Right Ear: External ear normal.  Nose: Nose normal.  Mouth/Throat: Oropharynx is clear and moist.  eryth nasal mucosa  Eyes: Conjunctivae are normal. Pupils are equal, round, and reactive to light. Right eye exhibits no discharge. Left eye exhibits no discharge.  Neck: Normal range of motion. Neck supple. No JVD present. No tracheal deviation present. No thyromegaly present.  Cardiovascular: Normal rate, regular rhythm and normal heart sounds.   Pulmonary/Chest: No stridor. No respiratory distress. She has no wheezes.  Abdominal: Soft. Bowel sounds are normal. She exhibits no distension and no mass. There is no tenderness. There is no rebound and no guarding.  Musculoskeletal: She exhibits no edema and no tenderness.  Lymphadenopathy:   She has no cervical adenopathy.  Neurological: She displays normal reflexes. No cranial nerve deficit. She exhibits normal muscle tone. Coordination normal.  Skin: No rash noted. No erythema.  Psychiatric: She has a normal mood and affect. Her behavior is normal. Judgment and thought content normal.  R knee is tender  Lab Results  Component Value Date   WBC 11.1* 10/29/2009   HGB 12.1 10/29/2009   HCT 36.5 10/29/2009   PLT 236.0 10/29/2009   GLUCOSE 83 10/29/2009   CHOL 201* 08/20/2009   TRIG 88.0 08/20/2009   HDL 41.50 08/20/2009   LDLDIRECT 149.7 08/20/2009   ALT 22 08/20/2009   AST 19 08/20/2009   NA 137 10/29/2009   K 4.1 10/29/2009   CL 102 10/29/2009   CREATININE 1.0 10/29/2009   BUN 10 10/29/2009   CO2 24 10/29/2009   TSH 1.76 08/20/2009   HGBA1C 5.6 06/24/2007         Assessment & Plan:

## 2013-01-04 NOTE — Assessment & Plan Note (Signed)
Continue with current prescription therapy as reflected on the Med list.  

## 2013-02-07 ENCOUNTER — Ambulatory Visit (INDEPENDENT_AMBULATORY_CARE_PROVIDER_SITE_OTHER): Payer: BC Managed Care – PPO | Admitting: Internal Medicine

## 2013-02-07 ENCOUNTER — Encounter: Payer: Self-pay | Admitting: Internal Medicine

## 2013-02-07 VITALS — BP 116/70 | HR 84 | Temp 100.4°F | Resp 16 | Wt 221.0 lb

## 2013-02-07 DIAGNOSIS — J329 Chronic sinusitis, unspecified: Secondary | ICD-10-CM

## 2013-02-07 MED ORDER — AMOXICILLIN 500 MG PO CAPS: 1000.0000 mg | ORAL_CAPSULE | Freq: Two times a day (BID) | ORAL | Status: DC

## 2013-02-07 NOTE — Patient Instructions (Signed)
Use over-the-counter  "cold" medicines  such as  "Afrin" nasal spray for nasal congestion as directed instead. Use" Delsym" or" Robitussin" cough syrup varietis for cough.  You can use plain "Tylenol" or "Advil" for fever, chills and achyness.  Please, make an appointment if you are not better or if you're worse.  

## 2013-02-07 NOTE — Progress Notes (Signed)
Pre visit review using our clinic review tool, if applicable. No additional management support is needed unless otherwise documented below in the visit note. 

## 2013-02-07 NOTE — Assessment & Plan Note (Signed)
See meds 

## 2013-02-07 NOTE — Progress Notes (Signed)
   Subjective:    HPI   HPI  C/o URI sx's x  10 days. C/o ST,bad cough, weakness, fever. Not better with OTC medicines. Actually, the patient is getting worse. The patient did not sleep well last night due to cough.  Review of Systems  Constitutional: Positive for fever, chills and fatigue.  HENT: Positive for congestion, rhinorrhea, sneezing and postnasal drip.   Eyes: Positive for photophobia and pain. Negative for discharge and visual disturbance.  Respiratory: Positive for cough and wheezing.    Gastrointestinal: Negative for vomiting, abdominal pain, diarrhea and abdominal distention.  Genitourinary: Negative for dysuria and difficulty urinating.  Skin: Negative for rash.  Neurological: Positive for dizziness, weakness and light-headedness.      Review of Systems     Objective:   Physical Exam  Constitutional: She appears well-developed. No distress.  HENT:  Head: Normocephalic.  Right Ear: External ear normal.  Left Ear: External ear normal.  Nose: Nose normal.  eryth throat  Eyes: Conjunctivae are normal. Pupils are equal, round, and reactive to light. Right eye exhibits no discharge. Left eye exhibits no discharge.  Neck: Normal range of motion. Neck supple. No JVD present. No tracheal deviation present. No thyromegaly present.  Cardiovascular: Normal rate, regular rhythm and normal heart sounds.   Pulmonary/Chest: No stridor. No respiratory distress. She has no wheezes.  Abdominal: Soft. Bowel sounds are normal. She exhibits no distension and no mass. There is no tenderness. There is no rebound and no guarding.  Musculoskeletal: She exhibits no edema and no tenderness.  Lymphadenopathy:    She has no cervical adenopathy.  Neurological: She displays normal reflexes. No cranial nerve deficit. She exhibits normal muscle tone. Coordination normal.  Skin: No rash noted. No erythema.  Psychiatric: She has a normal mood and affect. Her behavior is normal. Judgment and  thought content normal.          Assessment & Plan:

## 2013-02-11 ENCOUNTER — Telehealth: Payer: Self-pay | Admitting: *Deleted

## 2013-02-11 NOTE — Telephone Encounter (Signed)
Pt called staes she is not feeling any better.  Please advise

## 2013-02-11 NOTE — Telephone Encounter (Signed)
Pt made an appt for Tues.  She may call back for [facility].  Wanted to wait for Dr. Posey Rea.

## 2013-02-15 ENCOUNTER — Ambulatory Visit (INDEPENDENT_AMBULATORY_CARE_PROVIDER_SITE_OTHER): Payer: BC Managed Care – PPO | Admitting: Internal Medicine

## 2013-02-15 ENCOUNTER — Ambulatory Visit (INDEPENDENT_AMBULATORY_CARE_PROVIDER_SITE_OTHER)
Admission: RE | Admit: 2013-02-15 | Discharge: 2013-02-15 | Disposition: A | Discharge status: Home / Self Care | Payer: BC Managed Care – PPO | Source: Ambulatory Visit | Attending: Internal Medicine | Admitting: Internal Medicine

## 2013-02-15 ENCOUNTER — Encounter: Payer: Self-pay | Admitting: Internal Medicine

## 2013-02-15 ENCOUNTER — Telehealth: Payer: Self-pay | Admitting: Endocrinology

## 2013-02-15 VITALS — BP 108/72 | HR 76 | Temp 99.4°F | Resp 16 | Wt 223.0 lb

## 2013-02-15 DIAGNOSIS — J45909 Unspecified asthma, uncomplicated: Secondary | ICD-10-CM

## 2013-02-15 DIAGNOSIS — R05 Cough: Secondary | ICD-10-CM

## 2013-02-15 DIAGNOSIS — R9389 Abnormal findings on diagnostic imaging of other specified body structures: Secondary | ICD-10-CM

## 2013-02-15 DIAGNOSIS — J329 Chronic sinusitis, unspecified: Secondary | ICD-10-CM

## 2013-02-15 DIAGNOSIS — R918 Other nonspecific abnormal finding of lung field: Secondary | ICD-10-CM

## 2013-02-15 MED ORDER — FLUTICASONE FUROATE-VILANTEROL 100-25 MCG/INH IN AEPB: 1.0000 | INHALATION_SPRAY | Freq: Every day | RESPIRATORY_TRACT | Status: DC

## 2013-02-15 MED ORDER — LEVOFLOXACIN 500 MG PO TABS: 500.0000 mg | ORAL_TABLET | Freq: Every day | ORAL | Status: DC

## 2013-02-15 MED ORDER — HYDROCOD POLST-CHLORPHEN POLST 10-8 MG/5ML PO LQCR: 5.0000 mL | Freq: Two times a day (BID) | ORAL | Status: DC | PRN

## 2013-02-15 NOTE — Progress Notes (Signed)
Pre visit review using our clinic review tool, if applicable. No additional management support is needed unless otherwise documented below in the visit note. 

## 2013-02-15 NOTE — Assessment & Plan Note (Signed)
Seems better  

## 2013-02-15 NOTE — Assessment & Plan Note (Signed)
CXR

## 2013-02-15 NOTE — Telephone Encounter (Signed)
i received phone-call about critical x-ray results.  Results forwarded to dr plotnikov.

## 2013-02-15 NOTE — Progress Notes (Signed)
   Subjective:    HPI     C/o URI sx's x  16 days. C/o ST,bad cough, weakness, fever. Not better with OTC medicines. Actually, the patient's cough is getting worse. The patient did not sleep well. C/o DOE   Review of Systems  Constitutional: Positive for fever, chills and fatigue.  HENT: Positive for congestion, rhinorrhea, sneezing and postnasal drip.   Eyes: Positive for photophobia and pain. Negative for discharge and visual disturbance.  Respiratory: Positive for cough and wheezing.    Gastrointestinal: Negative for vomiting, abdominal pain, diarrhea and abdominal distention.  Genitourinary: Negative for dysuria and difficulty urinating.  Skin: Negative for rash.  Neurological: Positive for dizziness, weakness and light-headedness.      Review of Systems     Objective:   Physical Exam  Constitutional: She appears well-developed. No distress.  HENT:  Head: Normocephalic.  Right Ear: External ear normal.  Left Ear: External ear normal.  Nose: Nose normal.  eryth throat  Eyes: Conjunctivae are normal. Pupils are equal, round, and reactive to light. Right eye exhibits no discharge. Left eye exhibits no discharge.  Neck: Normal range of motion. Neck supple. No JVD present. No tracheal deviation present. No thyromegaly present.  Cardiovascular: Normal rate, regular rhythm and normal heart sounds.   Pulmonary/Chest: No stridor. No respiratory distress. She has no wheezes.  Abdominal: Soft. Bowel sounds are normal. She exhibits no distension and no mass. There is no tenderness. There is no rebound and no guarding.  Musculoskeletal: She exhibits no edema and no tenderness.  Lymphadenopathy:    She has no cervical adenopathy.  Neurological: She displays normal reflexes. No cranial nerve deficit. She exhibits normal muscle tone. Coordination normal.  Skin: No rash noted. No erythema.  Psychiatric: She has a normal mood and affect. Her behavior is normal. Judgment and thought  content normal.  B wheezing   I personally provided Breo  inhaler use teaching. After the teaching patient was able to demonstrate it's use effectively. All questions were answered        Assessment & Plan:

## 2013-02-15 NOTE — Assessment & Plan Note (Signed)
Levaquin Breo Tussionex CXR

## 2013-02-16 ENCOUNTER — Telehealth: Payer: Self-pay | Admitting: Internal Medicine

## 2013-02-16 DIAGNOSIS — R9389 Abnormal findings on diagnostic imaging of other specified body structures: Secondary | ICD-10-CM | POA: Insufficient documentation

## 2013-02-16 NOTE — Telephone Encounter (Signed)
Seen yesterday.  Spitting up blood today.  What should she do?

## 2013-02-16 NOTE — Assessment & Plan Note (Signed)
12/14 R Chest CT Labs

## 2013-02-16 NOTE — Telephone Encounter (Signed)
addressed

## 2013-02-16 NOTE — Addendum Note (Signed)
Addended by: Tresa Garter on: 02/16/2013 07:44 AM   Modules accepted: Orders, Level of Service

## 2013-02-16 NOTE — Telephone Encounter (Signed)
Pls see MyChart note. We are scheduling chest CT. Take new meds. Go to ER if very bad. Thx

## 2013-02-17 NOTE — Telephone Encounter (Signed)
Pt informed

## 2013-02-18 ENCOUNTER — Encounter (HOSPITAL_COMMUNITY): Payer: Self-pay | Admitting: Emergency Medicine

## 2013-02-18 ENCOUNTER — Telehealth: Payer: Self-pay | Admitting: Internal Medicine

## 2013-02-18 ENCOUNTER — Inpatient Hospital Stay (HOSPITAL_COMMUNITY)
Admission: EM | Admit: 2013-02-18 | Discharge: 2013-02-23 | DRG: 193 | Disposition: A | Discharge status: Home / Self Care | Payer: BC Managed Care – PPO | Attending: Internal Medicine | Admitting: Internal Medicine

## 2013-02-18 ENCOUNTER — Ambulatory Visit (INDEPENDENT_AMBULATORY_CARE_PROVIDER_SITE_OTHER)
Admission: RE | Admit: 2013-02-18 | Discharge: 2013-02-18 | Disposition: A | Discharge status: Home / Self Care | Payer: BC Managed Care – PPO | Source: Ambulatory Visit | Attending: Internal Medicine | Admitting: Internal Medicine

## 2013-02-18 DIAGNOSIS — R0602 Shortness of breath: Secondary | ICD-10-CM

## 2013-02-18 DIAGNOSIS — R05 Cough: Secondary | ICD-10-CM

## 2013-02-18 DIAGNOSIS — R9389 Abnormal findings on diagnostic imaging of other specified body structures: Secondary | ICD-10-CM

## 2013-02-18 DIAGNOSIS — J3801 Paralysis of vocal cords and larynx, unilateral: Secondary | ICD-10-CM | POA: Diagnosis present

## 2013-02-18 DIAGNOSIS — C3490 Malignant neoplasm of unspecified part of unspecified bronchus or lung: Secondary | ICD-10-CM | POA: Diagnosis present

## 2013-02-18 DIAGNOSIS — G609 Hereditary and idiopathic neuropathy, unspecified: Secondary | ICD-10-CM | POA: Diagnosis present

## 2013-02-18 DIAGNOSIS — R918 Other nonspecific abnormal finding of lung field: Secondary | ICD-10-CM

## 2013-02-18 DIAGNOSIS — K219 Gastro-esophageal reflux disease without esophagitis: Secondary | ICD-10-CM | POA: Diagnosis present

## 2013-02-18 DIAGNOSIS — Z79899 Other long term (current) drug therapy: Secondary | ICD-10-CM

## 2013-02-18 DIAGNOSIS — J9811 Atelectasis: Secondary | ICD-10-CM

## 2013-02-18 DIAGNOSIS — Z801 Family history of malignant neoplasm of trachea, bronchus and lung: Secondary | ICD-10-CM

## 2013-02-18 DIAGNOSIS — J9600 Acute respiratory failure, unspecified whether with hypoxia or hypercapnia: Secondary | ICD-10-CM | POA: Diagnosis present

## 2013-02-18 DIAGNOSIS — Z791 Long term (current) use of non-steroidal anti-inflammatories (NSAID): Secondary | ICD-10-CM

## 2013-02-18 DIAGNOSIS — N179 Acute kidney failure, unspecified: Secondary | ICD-10-CM | POA: Diagnosis present

## 2013-02-18 DIAGNOSIS — F3289 Other specified depressive episodes: Secondary | ICD-10-CM | POA: Diagnosis present

## 2013-02-18 DIAGNOSIS — E871 Hypo-osmolality and hyponatremia: Secondary | ICD-10-CM | POA: Diagnosis present

## 2013-02-18 DIAGNOSIS — F411 Generalized anxiety disorder: Secondary | ICD-10-CM

## 2013-02-18 DIAGNOSIS — J40 Bronchitis, not specified as acute or chronic: Secondary | ICD-10-CM | POA: Diagnosis present

## 2013-02-18 DIAGNOSIS — J189 Pneumonia, unspecified organism: Principal | ICD-10-CM | POA: Diagnosis present

## 2013-02-18 DIAGNOSIS — F329 Major depressive disorder, single episode, unspecified: Secondary | ICD-10-CM | POA: Diagnosis present

## 2013-02-18 DIAGNOSIS — Z87891 Personal history of nicotine dependence: Secondary | ICD-10-CM

## 2013-02-18 DIAGNOSIS — I10 Essential (primary) hypertension: Secondary | ICD-10-CM

## 2013-02-18 DIAGNOSIS — D649 Anemia, unspecified: Secondary | ICD-10-CM | POA: Diagnosis present

## 2013-02-18 HISTORY — DX: Other complications of anesthesia, initial encounter: T88.59XA

## 2013-02-18 HISTORY — DX: Adverse effect of unspecified anesthetic, initial encounter: T41.45XA

## 2013-02-18 LAB — CBC WITH DIFFERENTIAL/PLATELET
Basophils Absolute: 0.1 10*3/uL (ref 0.0–0.1)
Basophils Relative: 1 % (ref 0–1)
Eosinophils Relative: 2 % (ref 0–5)
HCT: 37.6 % (ref 36.0–46.0)
MCHC: 32.2 g/dL (ref 30.0–36.0)
Monocytes Absolute: 1.1 10*3/uL — ABNORMAL HIGH (ref 0.1–1.0)
Neutro Abs: 6.2 10*3/uL (ref 1.7–7.7)
Platelets: 312 10*3/uL (ref 150–400)
RDW: 13 % (ref 11.5–15.5)

## 2013-02-18 LAB — COMPREHENSIVE METABOLIC PANEL
AST: 19 U/L (ref 0–37)
Albumin: 3 g/dL — ABNORMAL LOW (ref 3.5–5.2)
Calcium: 9.4 mg/dL (ref 8.4–10.5)
Chloride: 95 mEq/L — ABNORMAL LOW (ref 96–112)
Creatinine, Ser: 1.2 mg/dL — ABNORMAL HIGH (ref 0.50–1.10)

## 2013-02-18 MED ORDER — BUPROPION HCL ER (SR) 150 MG PO TB12
150.0000 mg | ORAL_TABLET | Freq: Every day | ORAL | Status: DC
  Administered 2013-02-18 – 2013-02-22 (×5): 150 mg via ORAL
  Filled 2013-02-18 (×6): qty 1

## 2013-02-18 MED ORDER — HYDROMORPHONE HCL PF 1 MG/ML IJ SOLN: 1.0000 mg | INTRAMUSCULAR | Status: DC | PRN

## 2013-02-18 MED ORDER — IOHEXOL 300 MG/ML  SOLN
75.0000 mL | Freq: Once | INTRAMUSCULAR | Status: AC | PRN
  Administered 2013-02-18: 75 mL via INTRAVENOUS

## 2013-02-18 MED ORDER — LORATADINE 10 MG PO TABS
10.0000 mg | ORAL_TABLET | Freq: Every day | ORAL | Status: DC
  Administered 2013-02-19 – 2013-02-23 (×4): 10 mg via ORAL
  Filled 2013-02-18 (×5): qty 1

## 2013-02-18 MED ORDER — ALBUTEROL SULFATE (5 MG/ML) 0.5% IN NEBU: 2.5000 mg | INHALATION_SOLUTION | RESPIRATORY_TRACT | Status: DC | PRN

## 2013-02-18 MED ORDER — IPRATROPIUM BROMIDE 0.02 % IN SOLN
0.5000 mg | Freq: Once | RESPIRATORY_TRACT | Status: AC
  Administered 2013-02-18: 0.5 mg via RESPIRATORY_TRACT
  Filled 2013-02-18: qty 2.5

## 2013-02-18 MED ORDER — ALBUTEROL SULFATE (5 MG/ML) 0.5% IN NEBU
2.5000 mg | INHALATION_SOLUTION | Freq: Four times a day (QID) | RESPIRATORY_TRACT | Status: DC
  Administered 2013-02-18 – 2013-02-20 (×6): 2.5 mg via RESPIRATORY_TRACT
  Filled 2013-02-18 (×6): qty 0.5

## 2013-02-18 MED ORDER — ENOXAPARIN SODIUM 40 MG/0.4ML ~~LOC~~ SOLN
40.0000 mg | SUBCUTANEOUS | Status: DC
  Administered 2013-02-18: 40 mg via SUBCUTANEOUS
  Filled 2013-02-18 (×2): qty 0.4

## 2013-02-18 MED ORDER — CARVEDILOL 12.5 MG PO TABS
12.5000 mg | ORAL_TABLET | Freq: Two times a day (BID) | ORAL | Status: DC
  Administered 2013-02-18 – 2013-02-23 (×8): 12.5 mg via ORAL
  Filled 2013-02-18 (×13): qty 1

## 2013-02-18 MED ORDER — ALBUTEROL SULFATE (5 MG/ML) 0.5% IN NEBU
5.0000 mg | INHALATION_SOLUTION | Freq: Once | RESPIRATORY_TRACT | Status: AC
  Administered 2013-02-18: 5 mg via RESPIRATORY_TRACT
  Filled 2013-02-18: qty 1

## 2013-02-18 MED ORDER — LORAZEPAM 1 MG PO TABS
1.0000 mg | ORAL_TABLET | Freq: Four times a day (QID) | ORAL | Status: DC | PRN
  Administered 2013-02-18 – 2013-02-21 (×4): 1 mg via ORAL
  Filled 2013-02-18 (×4): qty 1

## 2013-02-18 MED ORDER — BUPROPION HCL ER (SR) 150 MG PO TB12: 150.0000 mg | ORAL_TABLET | Freq: Two times a day (BID) | ORAL | Status: DC

## 2013-02-18 MED ORDER — IPRATROPIUM BROMIDE 0.02 % IN SOLN
0.5000 mg | Freq: Four times a day (QID) | RESPIRATORY_TRACT | Status: DC
  Administered 2013-02-19 – 2013-02-20 (×5): 0.5 mg via RESPIRATORY_TRACT
  Filled 2013-02-18 (×5): qty 2.5

## 2013-02-18 MED ORDER — OXYCODONE-ACETAMINOPHEN 5-325 MG PO TABS
1.0000 | ORAL_TABLET | ORAL | Status: DC | PRN
  Administered 2013-02-22: 2 via ORAL
  Filled 2013-02-18: qty 2

## 2013-02-18 MED ORDER — AZITHROMYCIN 500 MG PO TABS
500.0000 mg | ORAL_TABLET | Freq: Every day | ORAL | Status: DC
  Administered 2013-02-18 – 2013-02-22 (×4): 500 mg via ORAL
  Filled 2013-02-18 (×5): qty 1

## 2013-02-18 MED ORDER — HYDROCOD POLST-CHLORPHEN POLST 10-8 MG/5ML PO LQCR
5.0000 mL | Freq: Two times a day (BID) | ORAL | Status: DC | PRN
  Administered 2013-02-19 – 2013-02-21 (×3): 5 mL via ORAL
  Filled 2013-02-18 (×3): qty 5

## 2013-02-18 MED ORDER — FLUTICASONE FUROATE-VILANTEROL 100-25 MCG/INH IN AEPB
1.0000 | INHALATION_SPRAY | Freq: Every day | RESPIRATORY_TRACT | Status: DC
  Administered 2013-02-20: 10:00:00 1 via RESPIRATORY_TRACT

## 2013-02-18 MED ORDER — DEXTROSE 5 % IV SOLN
1.0000 g | INTRAVENOUS | Status: DC
  Administered 2013-02-18 – 2013-02-21 (×4): 1 g via INTRAVENOUS
  Filled 2013-02-18 (×5): qty 10

## 2013-02-18 MED ORDER — SODIUM CHLORIDE 0.9 % IV SOLN
INTRAVENOUS | Status: DC
  Administered 2013-02-19: 19:00:00 via INTRAVENOUS
  Administered 2013-02-19 – 2013-02-20 (×2): 1000 mL via INTRAVENOUS
  Administered 2013-02-20 – 2013-02-21 (×2): via INTRAVENOUS

## 2013-02-18 MED ORDER — FLUTICASONE PROPIONATE 50 MCG/ACT NA SUSP
2.0000 | Freq: Every day | NASAL | Status: DC
  Administered 2013-02-19 – 2013-02-20 (×2): 2 via NASAL
  Filled 2013-02-18: qty 16

## 2013-02-18 MED ORDER — SPIRONOLACTONE 25 MG PO TABS
25.0000 mg | ORAL_TABLET | Freq: Every day | ORAL | Status: DC
  Administered 2013-02-19 – 2013-02-23 (×4): 25 mg via ORAL
  Filled 2013-02-18 (×5): qty 1

## 2013-02-18 MED ORDER — SODIUM CHLORIDE 0.9 % IV BOLUS (SEPSIS)
1000.0000 mL | Freq: Once | INTRAVENOUS | Status: AC
  Administered 2013-02-18: 1000 mL via INTRAVENOUS

## 2013-02-18 MED ORDER — SODIUM CHLORIDE 0.9 % IV SOLN
INTRAVENOUS | Status: DC
  Administered 2013-02-18: 16:00:00 via INTRAVENOUS

## 2013-02-18 NOTE — ED Provider Notes (Signed)
CSN: [id number]     Arrival date & time 02/18/13  1409 History   First MD Initiated Contact with Patient 02/18/13 1500     Chief Complaint  Patient presents with  . Shortness of Breath    C/o dyspnea, refractory to fluticasone at 1100.  Patient with audible wheezsing, had CT and chest xray earlier tpoday "Showed a spot on the right lung".  Patien   (Consider location/radiation/quality/duration/timing/severity/associated sxs/prior Treatment) HPI Comments: Patient with no prior lung history, diagnosed with large R suspected bronchogenic mass today on CT -- presents with worsening wheezing and shortness of breath for the past 2 weeks. SOB is much worse with activity. She was initially treated for bronchitis with inhaled steroids. Work up included CXR which progressed to chest CT today. She has had intermittent low grade fevers to 100.40F. She has had some cough with hemoptysis. No URI sx. No N/V/D, urinary symptoms. The onset of this condition was acute. The course is worsening. Aggravating factors: activity. Alleviating factors: none.    Patient is a 62 y.o. female presenting with shortness of breath. The history is provided by the patient and medical records.  Shortness of Breath Associated symptoms: cough, fever and wheezing   Associated symptoms: no abdominal pain, no chest pain, no headaches, no rash, no sore throat and no vomiting     Past Medical History  Diagnosis Date  . Anxiety   . Depression   . Peripheral neuropathy     feet: unknown etiol  . Elevated glucose   . Allergic rhinitis   . GERD (gastroesophageal reflux disease)    Past Surgical History  Procedure Laterality Date  . Abdominal hysterectomy      partial   Family History  Problem Relation Age of Onset  . Hypertension Other   . Hypertension Mother   . Cancer Mother 26    lung ca  . Cancer Father    History  Substance Use Topics  . Smoking status: Former Games developer  . Smokeless tobacco: Not on file  . Alcohol  Use: No   OB History   Grav Para Term Preterm Abortions TAB SAB Ect Mult Living                 Review of Systems  Constitutional: Positive for fever.  HENT: Negative for rhinorrhea and sore throat.   Eyes: Negative for redness.  Respiratory: Positive for cough, shortness of breath and wheezing.   Cardiovascular: Negative for chest pain.  Gastrointestinal: Negative for nausea, vomiting, abdominal pain and diarrhea.  Genitourinary: Negative for dysuria.  Musculoskeletal: Negative for myalgias.  Skin: Negative for rash.  Neurological: Negative for headaches.    Allergies  Review of patient's allergies indicates no known allergies.  Home Medications   Current Outpatient Rx  Name  Route  Sig  Dispense  Refill  . buPROPion (WELLBUTRIN SR) 150 MG 12 hr tablet   Oral   Take 1 tablet (150 mg total) by mouth 2 (two) times daily.   180 tablet   3   . carvedilol (COREG) 12.5 MG tablet   Oral   Take 1 tablet (12.5 mg total) by mouth 2 (two) times daily with a meal.   180 tablet   3   . chlorpheniramine-HYDROcodone (TUSSIONEX PENNKINETIC ER) 10-8 MG/5ML LQCR   Oral   Take 5 mLs by mouth every 12 (twelve) hours as needed.   115 mL   0   . EXPIRED: cholecalciferol (VITAMIN D) 1000 UNITS tablet   Oral  Take 1,000 Units by mouth daily.         . fluticasone (FLONASE) 50 MCG/ACT nasal spray   Nasal   Place 2 sprays into the nose daily.   48 g   3   . Fluticasone Furoate-Vilanterol (BREO ELLIPTA) 100-25 MCG/INH AEPB   Inhalation   Inhale 1 Act into the lungs daily.   1 each   2   . levofloxacin (LEVAQUIN) 500 MG tablet   Oral   Take 1 tablet (500 mg total) by mouth daily.   10 tablet   0   . EXPIRED: loratadine (CLARITIN) 10 MG tablet   Oral   Take 1 tablet (10 mg total) by mouth daily.   100 tablet   3   . LORazepam (ATIVAN) 1 MG tablet      TAKE ONE TABLET BY MOUTH TWICE DAILY   180 tablet   0   . naproxen (NAPROSYN) 500 MG tablet   Oral   Take 1  tablet (500 mg total) by mouth 2 (two) times daily as needed.   60 tablet   3   . spironolactone (ALDACTONE) 25 MG tablet   Oral   Take 1 tablet (25 mg total) by mouth daily.   90 tablet   3   . traMADol (ULTRAM) 50 MG tablet   Oral   Take 1-2 tablets (50-100 mg total) by mouth 2 (two) times daily as needed for pain.   100 tablet   1   . triamcinolone (KENALOG) 0.5 % cream   Topical   Apply 1 application topically 2 (two) times daily.            BP 100/57  Pulse 80  Resp 28  Ht 5\' 5"  (1.651 m)  Wt 223 lb (101.152 kg)  BMI 37.11 kg/m2  SpO2 98% Physical Exam  Nursing note and vitals reviewed. Constitutional: She appears well-developed and well-nourished.  HENT:  Head: Normocephalic and atraumatic.  Eyes: Conjunctivae are normal. Right eye exhibits no discharge. Left eye exhibits no discharge.  Neck: Normal range of motion. Neck supple.  Cardiovascular: Normal rate, regular rhythm and normal heart sounds.   Pulmonary/Chest: Accessory muscle usage present. Tachypnea noted. No respiratory distress. She has decreased breath sounds in the right upper field. She has wheezes. She has no rhonchi. She has no rales.  Sternocleidomastoid retractions. Very loud inspiratory/expiratory wheezing worse centrally.   Abdominal: Soft. There is no tenderness.  Neurological: She is alert.  Skin: Skin is warm and dry.  Psychiatric: She has a normal mood and affect.    ED Course  Procedures (including critical care time) Labs Review Labs Reviewed  CBC WITH DIFFERENTIAL - Abnormal; Notable for the following:    Monocytes Absolute 1.1 (*)    All other components within normal limits  COMPREHENSIVE METABOLIC PANEL - Abnormal; Notable for the following:    Sodium 131 (*)    Chloride 95 (*)    Creatinine, Ser 1.20 (*)    Albumin 3.0 (*)    Alkaline Phosphatase 118 (*)    GFR calc non Af Amer 48 (*)    GFR calc Af Amer 55 (*)    All other components within normal limits   Imaging  Review Ct Chest W Contrast  02/18/2013   CLINICAL DATA:  Abnormal chest radiograph.  Smoker  EXAM: CT CHEST WITH CONTRAST  TECHNIQUE: Multidetector CT imaging of the chest was performed during intravenous contrast administration.  CONTRAST:  75mL OMNIPAQUE IOHEXOL 300 MG/ML  SOLN  COMPARISON:  DG CHEST 2 VIEW dated 02/15/2013  FINDINGS: There is a right suprahilar mass obstructing the right upper lobe bronchus. This mass extends into the mediastinum and measures 3.7 by 3.8 cm in axial dimension (image 20) and extends 7 cm in craniocaudad dimension along the right paratracheal location. The mass encroaches upon the bronchus intermedius narrowing this bronchus (image 20). The mass abuts the right pulmonary artery. Large subcarinal lymph node measures 30 mm. There is a large right hilar lymph node measuring 18 mm. Potentially enlarged left hilar lymph node measures 21 mm.  Review of the lung parenchyma demonstrates collapse of the right upper lobe. No additional pulmonary nodularity.  Limited view of the upper abdomen partially images the adrenal glands. No mass seen. No focal hepatic lesion. Review of the bone windows demonstrates degenerative change of the spine. No aggressive osseous lesion.  IMPRESSION: 1. 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 2. Bulky sub carinal adenopathy 3. Right hilar adenopathy a potential left hilar adenopathy. 4. Recommend FDG PET scanning for staging and oncology consultation. This was made a call report.   Electronically Signed   By: Genevive Bi M.D.   On: 02/18/2013 11:08    EKG Interpretation   None      3:15 PM Patient seen and examined. Work-up initiated. Medications ordered.   Vital signs reviewed and are as follows: Filed Vitals:   02/18/13 1432  BP: 100/57  Pulse: 80  Resp: 28   3:33 PM D/w Dr. Blinda Leatherwood. Spoke with patient and family more in depth  regarding CT findings and concern for lung CA. Awaiting labs and will ask for admission for symptom control and initial work-up.consultation.   5:40 PM Spoke with [facility] who will see. Patient updated. She appears much more comfortable now in no distress. Awaiting hospitalist consult. Questions answered.     MDM   1. Shortness of breath   2. Atelectasis of right lung   3. Lung mass    Patient with new diagnosis of lung mass, likely bronchogenic carcinoma but this needs to be confirmed. She has collapse of R upper lobe due to obstruction from mass. She is symptomatic from the mass, especially with activity. Cannot rule out post obstructive PNA.     Renne Crigler, PA-C 02/18/13 1744

## 2013-02-18 NOTE — Progress Notes (Signed)
Utilization Review completed.  Jonalyn Sedlak RN CM  

## 2013-02-18 NOTE — ED Notes (Signed)
Patient states sx onset two weeks ago, diagnosed with "acute bronchitis" per patient.  Audible wheezing, pt using accessory muscles of respiration. Spouse at bedside.  Spo2 98% on room air.

## 2013-02-18 NOTE — H&P (Addendum)
[facility] History and Physical  Meagan Garcia BMW:[id number] DOB: [birth year] DOA: 02/18/2013  Referring physician: ED physician PCP: Sonda Primes, MD   Chief Complaint: shortness of breath   HPI:  Pt is 62 yo female who presented to [facility] ED with main concern of progressively worsening shortness of breath that initially started 2-3 weeks prior to this admission and noted to be present with exertion initially and now present at rest. This has been associated with wheezing, malaise, cough with hemoptysis. Pt explains she was treated for bronchitis but no significant improvement noted and now she feels uncomfortable with even minimal exertion. She also reports associated subjective fevers, chills, chest discomfort with coughing spells. She denies specific abdominal or urinary concerns.   In ED, pt noted to be wheezing with RR in 30's that has responded well to BD's and oxygen. CT chest worrisome for bronchogenic carcinoma and [facility] asked to admit for further evaluation and management.   Assessment and Plan:  Principal Problem:   Acute respiratory failure - most likely secondary to pulmonary pathology noted below on CT chest, ? Malignancy and post obstructive PNA - will admit to medical floor  - place on BD scheduled and as needed - place on oxygen as well - will place on empiric ABX for now and obtain sputum analysis Active Problems:   Lung mass - management as noted above - CT abdomen and pelvis will need to be ordered but once renal function stabilizes    Hyponatremia - will give IVF and will repeat BMP in AM   Acute renal failure  - likely pre renal, IVF stated and will repeat BMP in AM  Code Status: Full Family Communication: Pt at bedside Disposition Plan: Admit to medical floor   Review of Systems:  Constitutional: Negative for diaphoresis.  HENT: Negative for hearing loss, ear pain, nosebleeds, congestion, sore throat, neck pain, tinnitus and ear discharge.    Eyes: Negative for blurred vision, double vision, photophobia, pain, discharge and redness.  Respiratory: Per HPI  Cardiovascular: Negative for chest pain, palpitations, claudication and leg swelling.  Gastrointestinal: Negative for nausea, vomiting and abdominal pain. Negative for heartburn, constipation, blood in stool and melena.  Genitourinary: Negative for dysuria, urgency, frequency, hematuria and flank pain.  Musculoskeletal: Negative for myalgias, back pain, joint pain and falls.  Skin: Negative for itching and rash.  Neurological: Negative for tingling, tremors, sensory change, speech change, focal weakness, loss of consciousness and headaches.  Endo/Heme/Allergies: Negative for environmental allergies and polydipsia. Does not bruise/bleed easily.  Psychiatric/Behavioral: Negative for suicidal ideas. The patient is not nervous/anxious.      Past Medical History  Diagnosis Date  . Anxiety   . Depression   . Peripheral neuropathy     feet: unknown etiol  . Elevated glucose   . Allergic rhinitis   . GERD (gastroesophageal reflux disease)   . Complication of anesthesia w/tubal    got anest. "went crazy"     Past Surgical History  Procedure Laterality Date  . Abdominal hysterectomy      partial  . Tubal reversal    . Tubal ligation      x2  . Fibroid tumors    . Knee arthroscopy    . Foot surgery      cysts from both feet    Social History:  reports that she quit smoking about 17 years ago. She has never used smokeless tobacco. She reports that she does not drink alcohol or use illicit drugs.  No Known Allergies  Family History  Problem Relation Age of Onset  . Hypertension Other   . Hypertension Mother   . Cancer Mother 21    lung ca  . Cancer Father     Prior to Admission medications   Medication Sig Start Date End Date Taking? Authorizing Provider  buPROPion (WELLBUTRIN SR) 150 MG 12 hr tablet Take 150 mg by mouth daily. 04/14/12  Yes Tresa Garter,  MD  carvedilol (COREG) 12.5 MG tablet Take 1 tablet (12.5 mg total) by mouth 2 (two) times daily with a meal. 04/14/12  Yes Tresa Garter, MD  chlorpheniramine-HYDROcodone (TUSSIONEX PENNKINETIC ER) 10-8 MG/5ML LQCR Take 5 mLs by mouth every 12 (twelve) hours as needed. 02/15/13  Yes Tresa Garter, MD  cholecalciferol (VITAMIN D) 1000 UNITS tablet Take 1,000 Units by mouth daily. 11/20/10 02/18/13 Yes Georgina Quint Plotnikov, MD  fluticasone (FLONASE) 50 MCG/ACT nasal spray Place 2 sprays into the nose daily. 04/14/12  Yes Georgina Quint Plotnikov, MD  Fluticasone Furoate-Vilanterol (BREO ELLIPTA) 100-25 MCG/INH AEPB Inhale 1 Act into the lungs daily. 02/15/13  Yes Georgina Quint Plotnikov, MD  ibuprofen (ADVIL,MOTRIN) 200 MG tablet Take 400 mg by mouth every 6 (six) hours as needed (pain).   Yes Historical Provider, MD  levofloxacin (LEVAQUIN) 500 MG tablet Take 1 tablet (500 mg total) by mouth daily. 02/15/13  Yes Georgina Quint Plotnikov, MD  LORazepam (ATIVAN) 1 MG tablet TAKE ONE TABLET BY MOUTH TWICE DAILY 12/23/12  Yes Tresa Garter, MD  spironolactone (ALDACTONE) 25 MG tablet Take 1 tablet (25 mg total) by mouth daily. 04/14/12 05/04/13 Yes Georgina Quint Plotnikov, MD  loratadine (CLARITIN) 10 MG tablet Take 1 tablet (10 mg total) by mouth daily. 08/18/11 08/17/12  Tresa Garter, MD    Physical Exam: Filed Vitals:   02/18/13 1432 02/18/13 1434 02/18/13 1622 02/18/13 1709  BP: 100/57   123/58  Pulse: 80   86  Temp:   98.4 F (36.9 C) 98.6 F (37 C)  TempSrc:   Oral Oral  Resp: 28   18  Height: 5\' 5"  (1.651 m)     Weight: 101.152 kg (223 lb)     SpO2:  98%  97%    Physical Exam  Constitutional: Appears well-developed and well-nourished. No distress.  HENT: Normocephalic. External right and left ear normal. Oropharynx is clear and moist.  Eyes: Conjunctivae and EOM are normal. PERRLA, no scleral icterus.  Neck: Normal ROM. Neck supple. No JVD. No tracheal deviation. No thyromegaly.  CVS:  RRR, S1/S2 +, no murmurs, no gallops, no carotid bruit.  Pulmonary: Diminished breath sounds at bases with rhonchi, wheezing end expiratory  Abdominal: Soft. BS +,  no distension, tenderness, rebound or guarding.  Musculoskeletal: Normal range of motion. No edema and no tenderness.  Lymphadenopathy: No lymphadenopathy noted, cervical, inguinal. Neuro: Alert. Normal reflexes, muscle tone coordination. No cranial nerve deficit. Skin: Skin is warm and dry. No rash noted. Not diaphoretic. No erythema. No pallor.  Psychiatric: Normal mood and affect. Behavior, judgment, thought content normal.   Labs on Admission:  Basic Metabolic Panel:  Recent Labs Lab 02/18/13 1604  NA 131*  K 4.3  CL 95*  CO2 23  GLUCOSE 84  BUN 11  CREATININE 1.20*  CALCIUM 9.4   Liver Function Tests:  Recent Labs Lab 02/18/13 1604  AST 19  ALT 33  ALKPHOS 118*  BILITOT 0.6  PROT 7.8  ALBUMIN 3.0*   CBC:  Recent Labs Lab  02/18/13 1604  WBC 9.4  NEUTROABS 6.2  HGB 12.1  HCT 37.6  MCV 87.4  PLT 312   Radiological Exams on Admission: Ct Chest W Contrast  02/18/2013   CLINICAL DATA:  Abnormal chest radiograph.  Smoker  EXAM: CT CHEST WITH CONTRAST  TECHNIQUE: Multidetector CT imaging of the chest was performed during intravenous contrast administration.  CONTRAST:  75mL OMNIPAQUE IOHEXOL 300 MG/ML  SOLN  COMPARISON:  DG CHEST 2 VIEW dated 02/15/2013  FINDINGS: There is a right suprahilar mass obstructing the right upper lobe bronchus. This mass extends into the mediastinum and measures 3.7 by 3.8 cm in axial dimension (image 20) and extends 7 cm in craniocaudad dimension along the right paratracheal location. The mass encroaches upon the bronchus intermedius narrowing this bronchus (image 20). The mass abuts the right pulmonary artery. Large subcarinal lymph node measures 30 mm. There is a large right hilar lymph node measuring 18 mm. Potentially enlarged left hilar lymph node measures 21 mm.  Review of  the lung parenchyma demonstrates collapse of the right upper lobe. No additional pulmonary nodularity.  Limited view of the upper abdomen partially images the adrenal glands. No mass seen. No focal hepatic lesion. Review of the bone windows demonstrates degenerative change of the spine. No aggressive osseous lesion.  IMPRESSION: 1. 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 2. Bulky sub carinal adenopathy 3. Right hilar adenopathy a potential left hilar adenopathy. 4. Recommend FDG PET scanning for staging and oncology consultation. This was made a call report.   Electronically Signed   By: Genevive Bi M.D.   On: 02/18/2013 11:08    EKG: Normal sinus rhythm, no ST/T wave changes  Debbora Presto, MD  [facility] Pager [phone]  If 7PM-7AM, please contact night-coverage [email or web address] Password [facility] 02/18/2013, 5:51 PM

## 2013-02-18 NOTE — ED Notes (Signed)
Nebulaizer complete. Sp)2 98% resp 16, patient states easier to breather post neb.

## 2013-02-18 NOTE — Telephone Encounter (Signed)
I just called Mrs Self to discuss he CT findings. She is not doing well - SOB, wheezing. She will go to [facility] ER now.

## 2013-02-18 NOTE — ED Notes (Signed)
Albuterol 0.5% 5 mg and ipratropium 0.5 mg started via nebulizer on 8 liters of medical air. Awaiting patient ID bracelet.

## 2013-02-19 DIAGNOSIS — J189 Pneumonia, unspecified organism: Principal | ICD-10-CM

## 2013-02-19 LAB — CBC
MCH: 28.3 pg (ref 26.0–34.0)
MCHC: 32.3 g/dL (ref 30.0–36.0)
Platelets: 298 10*3/uL (ref 150–400)
RDW: 13.2 % (ref 11.5–15.5)

## 2013-02-19 LAB — BASIC METABOLIC PANEL
BUN: 9 mg/dL (ref 6–23)
Calcium: 8.7 mg/dL (ref 8.4–10.5)
GFR calc non Af Amer: 64 mL/min — ABNORMAL LOW (ref >90)
Glucose, Bld: 110 mg/dL — ABNORMAL HIGH (ref 70–99)
Sodium: 133 mEq/L — ABNORMAL LOW (ref 135–145)

## 2013-02-19 LAB — EXPECTORATED SPUTUM ASSESSMENT W GRAM STAIN, RFLX TO RESP C
Special Requests: NORMAL
Special Requests: NORMAL

## 2013-02-19 LAB — EXPECTORATED SPUTUM ASSESSMENT W REFEX TO RESP CULTURE

## 2013-02-19 MED ORDER — ENOXAPARIN SODIUM 60 MG/0.6ML ~~LOC~~ SOLN
50.0000 mg | SUBCUTANEOUS | Status: DC
  Filled 2013-02-19: qty 0.6

## 2013-02-19 MED ORDER — ACETAMINOPHEN 325 MG PO TABS
650.0000 mg | ORAL_TABLET | Freq: Four times a day (QID) | ORAL | Status: DC | PRN
  Administered 2013-02-19 – 2013-02-23 (×5): 650 mg via ORAL
  Filled 2013-02-19 (×5): qty 2

## 2013-02-19 MED ORDER — DOCUSATE SODIUM 100 MG PO CAPS
100.0000 mg | ORAL_CAPSULE | Freq: Two times a day (BID) | ORAL | Status: DC
  Administered 2013-02-19 – 2013-02-23 (×4): 100 mg via ORAL
  Filled 2013-02-19 (×10): qty 1

## 2013-02-19 NOTE — ED Provider Notes (Signed)
Medical screening examination/treatment/procedure(s) were conducted as a shared visit with non-physician practitioner(s) and myself.  I personally evaluated the patient during the encounter.  EKG Interpretation    Date/Time:    Ventricular Rate:    PR Interval:    QRS Duration:   QT Interval:    QTC Calculation:   R Axis:     Text Interpretation:              Examination reveals hypoxia and tachypnea the patient has already been aggressively treated as an outpatient for possible upper respiratory infection. CAT scan performed earlier today shows what appears to be bronchogenic mass. This patient's current symptomatology, will require hospitalization for further management.  Gilda Crease, MD 02/19/13 [phone]

## 2013-02-19 NOTE — Progress Notes (Signed)
Lab called sputum sample needs to be recollected.

## 2013-02-19 NOTE — Consult Note (Signed)
PULMONARY  / CRITICAL CARE MEDICINE  Name: Meagan Garcia MRN: [id number] DOB: 05-01-1950    ADMISSION DATE:  02/18/2013 CONSULTATION DATE:  02/19/13  REFERRING MD :  [facility]/ Dr Elisabeth Pigeon PRIMARY SERVICE:  [facility] CHIEF COMPLAINT:  Lung mass  BRIEF PATIENT DESCRIPTION: 62 yo F former smoker with malaise, cough, exertional dyspnea x 2-3 weeks. Onset of wheeze, cough with spot hemoptysis. CT shows R hilar mass obstructing RUL bronchus. PCCM consulted for management assistance.  SIGNIFICANT EVENTS / STUDIES:  CT chest- 02/18/13  LINES / TUBES:   CULTURES: Sputum Cx 02/19/13  ANTIBIOTICS: Zith 12/12>> ceft 12/12/>>  HISTORY OF PRESENT ILLNESS:  62 yo AAF quit smoking 17 years PTA, no hx pneumonia or TB. 2-3 weeks ago began noting DOE which progressed. Cough with specks of blood. Initially thought she had low fever, nasal congestion, frontal headache. Was intitially treated for bronchitis w/o benefit. New wheeze seems to respond to bronchodilators. Presented through ER>CXR>CT showing R hilar mass obstructing RUL bronchus and involving mediastinal nodes. [facility] is consulting for help with assessment/ management. Patient works Air traffic controller. "Long distance marriage"- husband works in [place].  PAST MEDICAL HISTORY :  Past Medical History  Diagnosis Date  . Anxiety   . Depression   . Peripheral neuropathy     feet: unknown etiol  . Elevated glucose   . Allergic rhinitis   . GERD (gastroesophageal reflux disease)   . Complication of anesthesia w/tubal    got anest. "went crazy"    Past Surgical History  Procedure Laterality Date  . Abdominal hysterectomy      partial  . Tubal reversal    . Tubal ligation      x2  . Fibroid tumors    . Knee arthroscopy    . Foot surgery      cysts from both feet   Prior to Admission medications   Medication Sig Start Date End Date Taking? Authorizing Provider  buPROPion (WELLBUTRIN SR) 150 MG 12 hr tablet Take 150 mg by mouth daily. 04/14/12   Yes Tresa Garter, MD  carvedilol (COREG) 12.5 MG tablet Take 1 tablet (12.5 mg total) by mouth 2 (two) times daily with a meal. 04/14/12  Yes Tresa Garter, MD  chlorpheniramine-HYDROcodone (TUSSIONEX PENNKINETIC ER) 10-8 MG/5ML LQCR Take 5 mLs by mouth every 12 (twelve) hours as needed. 02/15/13  Yes Tresa Garter, MD  cholecalciferol (VITAMIN D) 1000 UNITS tablet Take 1,000 Units by mouth daily. 11/20/10 02/18/13 Yes Georgina Quint Plotnikov, MD  fluticasone (FLONASE) 50 MCG/ACT nasal spray Place 2 sprays into the nose daily. 04/14/12  Yes Georgina Quint Plotnikov, MD  Fluticasone Furoate-Vilanterol (BREO ELLIPTA) 100-25 MCG/INH AEPB Inhale 1 Act into the lungs daily. 02/15/13  Yes Georgina Quint Plotnikov, MD  ibuprofen (ADVIL,MOTRIN) 200 MG tablet Take 400 mg by mouth every 6 (six) hours as needed (pain).   Yes Historical Provider, MD  levofloxacin (LEVAQUIN) 500 MG tablet Take 1 tablet (500 mg total) by mouth daily. 02/15/13  Yes Georgina Quint Plotnikov, MD  LORazepam (ATIVAN) 1 MG tablet TAKE ONE TABLET BY MOUTH TWICE DAILY 12/23/12  Yes Tresa Garter, MD  spironolactone (ALDACTONE) 25 MG tablet Take 1 tablet (25 mg total) by mouth daily. 04/14/12 05/04/13 Yes Georgina Quint Plotnikov, MD  loratadine (CLARITIN) 10 MG tablet Take 1 tablet (10 mg total) by mouth daily. 08/18/11 08/17/12  Tresa Garter, MD   No Known Allergies  FAMILY HISTORY:  Family History  Problem Relation Age of Onset  .  Hypertension Other   . Hypertension Mother   . Cancer Mother 42    lung ca  . Cancer Father    SOCIAL HISTORY:  reports that she quit smoking about 17 years ago. She has never used smokeless tobacco. She reports that she does not drink alcohol or use illicit drugs.  REVIEW OF SYSTEMS:   Constitutional: Negative for fever, chills, weight loss, malaise/fatigue and diaphoresis.  HENT: Negative for hearing loss, ear pain, nosebleeds, congestion, sore throat, neck pain, tinnitus and ear discharge.   Eyes:  Negative for blurred vision, double vision, photophobia, pain, discharge and redness.  Respiratory: + cough, +hemoptysis, sputum production,+ shortness of breath, +wheezing and +stridor.   Cardiovascular: Negative for chest pain, palpitations, orthopnea, claudication, leg swelling and PND.  Gastrointestinal: Negative for heartburn, nausea, vomiting, abdominal pain, diarrhea, constipation, blood in stool and melena.  Genitourinary: Negative for dysuria, urgency, frequency, hematuria and flank pain.  Musculoskeletal: Negative for myalgias, back pain, joint pain and falls.  Skin: Negative for itching and rash.  Neurological: Negative for dizziness, tingling, tremors, sensory change, speech change, focal weakness, seizures, loss of consciousness, weakness and headaches.  Endo/Heme/Allergies: Negative for environmental allergies and polydipsia. Does not bruise/bleed easily.  SUBJECTIVE:  Per HPI  VITAL SIGNS: Temp:  [98.4 F (36.9 C)-99.4 F (37.4 C)] 99.4 F (37.4 C) (12/13 0617) Pulse Rate:  [78-88] 87 (12/13 0617) Resp:  [18-28] 18 (12/13 0617) BP: (100-124)/(54-64) 103/64 mmHg (12/13 0617) SpO2:  [95 %-100 %] 95 % (12/13 1156) Weight:  [101.152 kg (223 lb)] 101.152 kg (223 lb) (12/12 1942)  PHYSICAL EXAMINATION: General:  Obese, pleasant, appropriate AAF, eating lunch Neuro:  PERRLA, speech clear, tongue midline, no-lateralizing HEENT: Pupils are equal, mucosa normal Neck:  Inspiratory stridor, no JVD Cardiovascular:  RRR, no m/g/r Lungs:  Inspiratory wheeze R upper anterior chest Abdomen: Soft, non-tender, no HSM, BS + Musculoskeletal:  Normal apparent strength Skin:  clear   Recent Labs Lab 02/18/13 1604 02/19/13 0405  NA 131* 133*  K 4.3 4.1  CL 95* 101  CO2 23 21  BUN 11 9  CREATININE 1.20* 0.94  GLUCOSE 84 110*    Recent Labs Lab 02/18/13 1604 02/19/13 0405  HGB 12.1 10.7*  HCT 37.6 33.1*  WBC 9.4 8.3  PLT 312 298   Ct Chest W Contrast  02/18/2013    CLINICAL DATA:  Abnormal chest radiograph.  Smoker  EXAM: CT CHEST WITH CONTRAST  TECHNIQUE: Multidetector CT imaging of the chest was performed during intravenous contrast administration.  CONTRAST:  75mL OMNIPAQUE IOHEXOL 300 MG/ML  SOLN  COMPARISON:  DG CHEST 2 VIEW dated 02/15/2013  FINDINGS: There is a right suprahilar mass obstructing the right upper lobe bronchus. This mass extends into the mediastinum and measures 3.7 by 3.8 cm in axial dimension (image 20) and extends 7 cm in craniocaudad dimension along the right paratracheal location. The mass encroaches upon the bronchus intermedius narrowing this bronchus (image 20). The mass abuts the right pulmonary artery. Large subcarinal lymph node measures 30 mm. There is a large right hilar lymph node measuring 18 mm. Potentially enlarged left hilar lymph node measures 21 mm.  Review of the lung parenchyma demonstrates collapse of the right upper lobe. No additional pulmonary nodularity.  Limited view of the upper abdomen partially images the adrenal glands. No mass seen. No focal hepatic lesion. Review of the bone windows demonstrates degenerative change of the spine. No aggressive osseous lesion.  IMPRESSION: 1. Large right suprahilar mass obstructs the  right upper lobe bronchus and invades the bronchus intermedius consists with bronchogenic carcinoma. 2. Bulky sub carinal adenopathy 3. Right hilar adenopathy a potential left hilar adenopathy. 4. Recommend FDG PET scanning for staging and oncology consultation. This was made a call report.   Electronically Signed   By: Genevive Bi M.D.   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 It became symptomatic as it began to occlude RUL. 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 There has been speck hemoptysis indicating some direct airway  invasion. Standard bronchoscopy should be diagnostic. This is not available over week-end. I have carefully discussed the procedure, commonly considered risks especially including, bleeding, cardiorespiratory distress, and un-expected death. I answered her questions to the best of my ability and she indicated understanding. I will discuss with team for bronchoscopy tentatively 12/15-Monday.  Terisa Starr, MD  Pulmonary and Critical Care Medicine [facility] p [phone]  m [phone] Pager: [phone]  02/19/2013, 1:22 PM

## 2013-02-19 NOTE — Progress Notes (Signed)
[facility] PROGRESS NOTE  TYLAH MANCILLAS NFA:[id number] DOB: 03-30-1950 DOA: 02/18/2013 PCP: Sonda Primes, MD  Brief narrative: Pt is 62 yo female who presented to [facility] ED 02/18/2013 with progressively worsening shortness of breath for past 2-3 weeks prior to this admission. Shortness of breath was prominent on exertion initially but now even at rest. There was also associated wheezing, malaise, fever, cough with hemoptysis. Pt explained she was treated for bronchitis but with no significant improvement.  In ED, pt was wheezing with RR in 30's that has responded well to BD's and oxygen. CT chest was worrisome for bronchogenic carcinoma.  Assessment and Plan:   Principal Problem:  Acute respiratory failure  - most likely secondary to postobstructive pneumonia, bronchogenic carcinoma - appreciate PCCM input on possible bronch and biopsy - continue BD scheduled and as needed  - oxygen support via nasal canula to keep O2 saturation above 90% - continue azithromycin and rocephin  - follow up sputum culture analysis Active Problems:  Lung mass  - management as noted above  - PCCM consulted - CT abdomen and pelvis will need to be ordered for further evaluation of possible malignancy; renal function WNL today but will give another 24 to make sure it remains WNL before we obtain CT abd Hyponatremia  - likely dehydration - sodium improved with IV fluids  Acute renal failure  - likely pre renal - improved to WNL with IV fluids  Anemia - hemoglobin dropped from 12 to 10.7 - repeat CBC in am - no indications for transusion  Code Status: Full  Family Communication: Family at the bedside  Disposition Plan: Remains inpatient   Manson Passey, MD  [facility] Pager [phone]  If 7PM-7AM, please contact night-coverage [email or web address] Password [email or web address] 02/19/2013, 6:51 AM   LOS: 1 day   Consultants:  PCCM  Procedures:  None   Antibiotics:  Azithromycin 02/18/2013  -->  Rocephin 02/18/2013 -->  HPI/Subjective: No overnight events.  Objective: Filed Vitals:   02/18/13 1709 02/18/13 1942 02/18/13 2230 02/19/13 0617  BP: 123/58 124/54  103/64  Pulse: 86 88 78 87  Temp: 98.6 F (37 C) 99.1 F (37.3 C)  99.4 F (37.4 C)  TempSrc: Oral Oral  Oral  Resp: 18 20  18   Height:  5\' 5"  (1.651 m)    Weight:  101.152 kg (223 lb)    SpO2: 97% 100%  97%    Intake/Output Summary (Last 24 hours) at 02/19/13 0651 Last data filed at 02/19/13 0617  Gross per 24 hour  Intake 1378.75 ml  Output    600 ml  Net 778.75 ml    Exam:   General:  Pt is alert, follows commands appropriately, not in acute distress  Cardiovascular: Regular rate and rhythm, S1/S2 appreciated   Respiratory: Clear to auscultation bilaterally, no wheezing, no crackles, no rhonchi  Abdomen: Soft, non tender, non distended, bowel sounds present, no guarding  Extremities: No edema, pulses DP and PT palpable bilaterally  Neuro: Grossly nonfocal  Data Reviewed: Basic Metabolic Panel:  Recent Labs Lab 02/18/13 1604 02/19/13 0405  NA 131* 133*  K 4.3 4.1  CL 95* 101  CO2 23 21  GLUCOSE 84 110*  BUN 11 9  CREATININE 1.20* 0.94  CALCIUM 9.4 8.7   Liver Function Tests:  Recent Labs Lab 02/18/13 1604  AST 19  ALT 33  ALKPHOS 118*  BILITOT 0.6  PROT 7.8  ALBUMIN 3.0*   No results found for this basename: LIPASE, AMYLASE,  in  the last 168 hours No results found for this basename: AMMONIA,  in the last 168 hours CBC:  Recent Labs Lab 02/18/13 1604 02/19/13 0405  WBC 9.4 8.3  NEUTROABS 6.2  --   HGB 12.1 10.7*  HCT 37.6 33.1*  MCV 87.4 87.6  PLT 312 298   Cardiac Enzymes: No results found for this basename: CKTOTAL, CKMB, CKMBINDEX, TROPONINI,  in the last 168 hours BNP: No components found with this basename: POCBNP,  CBG: No results found for this basename: GLUCAP,  in the last 168 hours  No results found for this or any previous visit (from the  past 240 hour(s)).   Studies: Ct Chest W Contrast 02/18/2013     IMPRESSION: 1. 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 2. Bulky sub carinal adenopathy 3. Right hilar adenopathy a potential left hilar adenopathy. 4. Recommend FDG PET scanning for staging and oncology consultation.    Scheduled Meds: . albuterol  2.5 mg Nebulization QID  . azithromycin  500 mg Oral Daily  . buPROPion  150 mg Oral Daily  . carvedilol  12.5 mg Oral BID WC  . cefTRIAXone   1 g Intravenous Q24H  . enoxaparin (LOVENOX)   40 mg Subcutaneous Q24H  . fluticasone  2 spray Each Nare Daily  . Fluticasone  1 Act Inhalation Daily  . ipratropium  0.5 mg Nebulization QID  . spironolactone  25 mg Oral Daily

## 2013-02-20 DIAGNOSIS — R05 Cough: Secondary | ICD-10-CM

## 2013-02-20 DIAGNOSIS — R059 Cough, unspecified: Secondary | ICD-10-CM

## 2013-02-20 LAB — RESPIRATORY VIRUS PANEL
Adenovirus: NOT DETECTED
Influenza A H1: NOT DETECTED
Influenza A H3: NOT DETECTED
Metapneumovirus: NOT DETECTED
Parainfluenza 3: NOT DETECTED
Respiratory Syncytial Virus B: NOT DETECTED
Rhinovirus: NOT DETECTED

## 2013-02-20 LAB — PROTIME-INR
INR: 1.12 (ref 0.00–1.49)
Prothrombin Time: 14.2 seconds (ref 11.6–15.2)

## 2013-02-20 LAB — APTT: aPTT: 29 seconds (ref 24–37)

## 2013-02-20 MED ORDER — ALBUTEROL SULFATE (5 MG/ML) 0.5% IN NEBU
2.5000 mg | INHALATION_SOLUTION | RESPIRATORY_TRACT | Status: DC | PRN
  Administered 2013-02-20 (×2): 2.5 mg via RESPIRATORY_TRACT
  Filled 2013-02-20 (×2): qty 0.5

## 2013-02-20 MED ORDER — IPRATROPIUM BROMIDE 0.02 % IN SOLN
0.5000 mg | RESPIRATORY_TRACT | Status: DC | PRN
  Administered 2013-02-20 (×2): 0.5 mg via RESPIRATORY_TRACT
  Filled 2013-02-20 (×2): qty 2.5

## 2013-02-20 MED ORDER — FLUTICASONE PROPIONATE 50 MCG/ACT NA SUSP
2.0000 | Freq: Every day | NASAL | Status: DC | PRN
  Filled 2013-02-20: qty 16

## 2013-02-20 NOTE — Progress Notes (Signed)
Nutrition Brief Note  Patient identified on the Malnutrition Screening Tool (MST) Report  Wt Readings from Last 15 Encounters:  02/18/13 223 lb (101.152 kg)  02/15/13 223 lb (101.152 kg)  02/07/13 221 lb (100.245 kg)  01/04/13 225 lb (102.059 kg)  06/21/12 229 lb (103.874 kg)  04/14/12 234 lb (106.142 kg)  08/18/11 233 lb (105.688 kg)  06/23/11 231 lb (104.781 kg)  06/12/11 229 lb (103.874 kg)  05/12/11 227 lb 12.8 oz (103.329 kg)  04/16/11 226 lb (102.513 kg)  03/26/11 233 lb (105.688 kg)  11/20/10 234 lb (106.142 kg)  01/18/10 228 lb (103.42 kg)  10/29/09 231 lb (104.781 kg)    Body mass index is 37.11 kg/(m^2). Patient meets criteria for obesity based on current BMI.   Current diet order is NPO.  Labs and medications reviewed.   Pt reports that she is eating fine and needs no nutritional supplements.   No nutrition interventions warranted at this time. If nutrition issues arise, please consult RD.   Ebbie Latus RD, LDN

## 2013-02-20 NOTE — Progress Notes (Signed)
Have attempt to collect sputum sample x4 without success. All samples not consistent with lower respiratory secretions. Lab recommends a respiratory culture (tracheal) per respiratory if still need culture. Per lab policy no more sputum cultures will be "run" if sent. Policy was suppose to stop after 2.

## 2013-02-20 NOTE — Progress Notes (Addendum)
[facility] PROGRESS NOTE  Meagan Garcia EAV:[id number] DOB: April 09, 1950 DOA: 02/18/2013 PCP: Sonda Primes, MD  Brief narrative: Pt is 62 yo female who presented to [facility] ED 02/18/2013 with progressively worsening shortness of breath for past 2-3 weeks prior to this admission. Shortness of breath was prominent on exertion initially but now even at rest. There was also associated wheezing, malaise, fever, cough with hemoptysis. Pt explained she was treated for bronchitis but with no significant improvement.  In ED, pt was wheezing with RR in 30's that has responded well to BD's and oxygen. CT chest was worrisome for bronchogenic carcinoma. Plan is for bronchoscopy in am per pulmonary recommendations.   Assessment and Plan:   Principal Problem:  Acute respiratory failure  - most likely secondary to postobstructive pneumonia, bronchogenic carcinoma  - appreciate PCCM input; bronch in am - continue BD every 4 hours PRN shortness of breath or wheezing - oxygen support via nasal canula to keep O2 saturation above 90%  - continue azithromycin and rocephin   Active Problems:  Lung mass  - management as noted above  - PCCM consulted  - oncology also consulted  Hyponatremia  - likely dehydration  - sodium improved with IV fluids  Acute renal failure  - likely pre renal  - improved to WNL with IV fluids  Anemia  - hemoglobin dropped from 12 to 10.7  - repeat CBC in am  - no indications for transusion   Code Status: Full  Family Communication: Family at the bedside  Disposition Plan: Remains inpatient   Consultants:  Pulmonary  Oncology Procedures:  None but bronchoscopy planned for Monday 02/21/2013 Antibiotics:  Azithromycin 02/18/2013 -->  Rocephin 02/18/2013 -->   Manson Passey, MD  [facility] Pager [phone]  If 7PM-7AM, please contact night-coverage [email or web address] Password [facility] 02/20/2013, 6:23 AM   LOS: 2 days    HPI/Subjective: No overnight  events.   Objective: Filed Vitals:   02/19/13 1350 02/19/13 2005 02/19/13 2018 02/20/13 0439  BP: 99/52  122/67 126/73  Pulse: 83 80 85 97  Temp: 98.1 F (36.7 C)  98.6 F (37 C) 98.7 F (37.1 C)  TempSrc: Oral  Oral Oral  Resp: 16  16 16   Height:      Weight:      SpO2: 100%  98% 97%    Intake/Output Summary (Last 24 hours) at 02/20/13 1308 Last data filed at 02/20/13 0440  Gross per 24 hour  Intake   2060 ml  Output   3500 ml  Net  -1440 ml    Exam:   General:  Pt is alert, follows commands appropriately, not in acute distress  Cardiovascular: Regular rate and rhythm, S1/S2 appreciated  Respiratory: some wheezing, no crackles  Abdomen: Soft, non tender, non distended, bowel sounds present, no guarding  Extremities: No edema, pulses DP and PT palpable bilaterally  Neuro: Grossly nonfocal  Data Reviewed: Basic Metabolic Panel:  Recent Labs Lab 02/18/13 1604 02/19/13 0405  NA 131* 133*  K 4.3 4.1  CL 95* 101  CO2 23 21  GLUCOSE 84 110*  BUN 11 9  CREATININE 1.20* 0.94  CALCIUM 9.4 8.7   Liver Function Tests:  Recent Labs Lab 02/18/13 1604  AST 19  ALT 33  ALKPHOS 118*  BILITOT 0.6  PROT 7.8  ALBUMIN 3.0*   No results found for this basename: LIPASE, AMYLASE,  in the last 168 hours No results found for this basename: AMMONIA,  in the last 168 hours CBC:  Recent Labs Lab 02/18/13 1604 02/19/13 0405  WBC 9.4 8.3  NEUTROABS 6.2  --   HGB 12.1 10.7*  HCT 37.6 33.1*  MCV 87.4 87.6  PLT 312 298   Cardiac Enzymes: No results found for this basename: CKTOTAL, CKMB, CKMBINDEX, TROPONINI,  in the last 168 hours BNP: No components found with this basename: POCBNP,  CBG: No results found for this basename: GLUCAP,  in the last 168 hours  Recent Results (from the past 240 hour(s))  CULTURE, EXPECTORATED SPUTUM-ASSESSMENT     Status: None   Collection Time    02/19/13  6:08 AM      Result Value Range Status   Specimen Description SPUTUM    Final   Special Requests NONE   Final   Sputum evaluation     Final   Value: MICROSCOPIC FINDINGS SUGGEST THAT THIS SPECIMEN IS NOT REPRESENTATIVE OF LOWER RESPIRATORY SECRETIONS. PLEASE RECOLLECT.     CALLED TO BALDWIN,R/3E @0703  ON 02/19/13 BY KARCZEWSKI,S.   Report Status 02/19/2013 FINAL   Final  CULTURE, EXPECTORATED SPUTUM-ASSESSMENT     Status: None   Collection Time    02/19/13 10:48 AM      Result Value Range Status   Specimen Description SPUTUM   Final   Special Requests Normal   Final   Sputum evaluation     Final   Value: MICROSCOPIC FINDINGS SUGGEST THAT THIS SPECIMEN IS NOT REPRESENTATIVE OF LOWER RESPIRATORY SECRETIONS. PLEASE RECOLLECT.     NOTIFIED BLACK,K AT 1140 ON [id number] BY HOOKER,B   Report Status 02/19/2013 FINAL   Final  CULTURE, EXPECTORATED SPUTUM-ASSESSMENT     Status: None   Collection Time    02/19/13  3:00 PM      Result Value Range Status   Specimen Description SPUTUM   Final   Special Requests Normal   Final   Sputum evaluation     Final   Value: MICROSCOPIC FINDINGS SUGGEST THAT THIS SPECIMEN IS NOT REPRESENTATIVE OF LOWER RESPIRATORY SECRETIONS. PLEASE RECOLLECT.     NOTIFIED KIM ATTENDING AT 1545 ON [id number] BY HOOKER,B   Report Status 02/19/2013 FINAL   Final     Studies: Ct Chest W Contrast 02/18/2013    IMPRESSION: 1. 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 2. Bulky sub carinal adenopathy 3. Right hilar adenopathy a potential left hilar adenopathy. 4. Recommend FDG PET scanning for staging and oncology consultation. This was made a call report.   Electronically Signed   By: Genevive Bi M.D.   On: 02/18/2013 11:08    Scheduled Meds: . albuterol  2.5 mg Nebulization QID  . azithromycin  500 mg Oral Daily  . buPROPion  150 mg Oral Daily  . carvedilol  12.5 mg Oral BID WC  . cefTRIAXone (ROCEPHIN)  IV  1 g Intravenous Q24H  . docusate  sodium  100 mg Oral BID  . fluticasone  2 spray Each Nare Daily  . Fluticasone Furoate-Vilanterol  1 Act Inhalation Daily  . ipratropium  0.5 mg Nebulization QID  . loratadine  10 mg Oral Daily  . spironolactone  25 mg Oral Daily   Continuous Infusions: . sodium chloride 75 mL/hr at 02/19/13 1851

## 2013-02-21 ENCOUNTER — Encounter (HOSPITAL_COMMUNITY)
Admission: EM | Disposition: A | Discharge status: Home / Self Care | Payer: Self-pay | Source: Home / Self Care | Attending: Internal Medicine

## 2013-02-21 ENCOUNTER — Telehealth: Payer: Self-pay | Admitting: Internal Medicine

## 2013-02-21 ENCOUNTER — Inpatient Hospital Stay (HOSPITAL_COMMUNITY): Payer: BC Managed Care – PPO

## 2013-02-21 DIAGNOSIS — I10 Essential (primary) hypertension: Secondary | ICD-10-CM

## 2013-02-21 HISTORY — PX: VIDEO BRONCHOSCOPY: SHX5072

## 2013-02-21 LAB — LEGIONELLA ANTIGEN, URINE: Legionella Antigen, Urine: NEGATIVE

## 2013-02-21 SURGERY — VIDEO BRONCHOSCOPY WITHOUT FLUORO
Anesthesia: Moderate Sedation | Laterality: Bilateral

## 2013-02-21 SURGERY — BRONCHOSCOPY, AT BEDSIDE
Anesthesia: Moderate Sedation

## 2013-02-21 MED ORDER — MIDAZOLAM HCL 10 MG/2ML IJ SOLN
INTRAMUSCULAR | Status: AC
  Filled 2013-02-21: qty 4

## 2013-02-21 MED ORDER — LIDOCAINE HCL 2 % EX GEL
Freq: Once | CUTANEOUS | Status: DC
  Filled 2013-02-21: qty 5

## 2013-02-21 MED ORDER — LIDOCAINE HCL 1 % IJ SOLN
INTRAMUSCULAR | Status: DC | PRN
  Administered 2013-02-21: 6 mL
  Administered 2013-02-21: 6 mL via RESPIRATORY_TRACT

## 2013-02-21 MED ORDER — BUTAMBEN-TETRACAINE-BENZOCAINE 2-2-14 % EX AERO
1.0000 | INHALATION_SPRAY | Freq: Once | CUTANEOUS | Status: DC
  Filled 2013-02-21: qty 56

## 2013-02-21 MED ORDER — LIDOCAINE HCL 2 % EX GEL
CUTANEOUS | Status: DC | PRN
  Administered 2013-02-21: 1

## 2013-02-21 MED ORDER — FENTANYL CITRATE 0.05 MG/ML IJ SOLN
INTRAMUSCULAR | Status: DC | PRN
  Administered 2013-02-21 (×3): 50 ug via INTRAVENOUS

## 2013-02-21 MED ORDER — PHENYLEPHRINE HCL 0.25 % NA SOLN
NASAL | Status: DC | PRN
  Administered 2013-02-21: 1 via NASAL

## 2013-02-21 MED ORDER — SODIUM CHLORIDE 0.9 % IV SOLN: INTRAVENOUS | Status: DC

## 2013-02-21 MED ORDER — FENTANYL CITRATE 0.05 MG/ML IJ SOLN
INTRAMUSCULAR | Status: AC
  Filled 2013-02-21: qty 4

## 2013-02-21 MED ORDER — PHENYLEPHRINE HCL 0.25 % NA SOLN: 1.0000 | Freq: Four times a day (QID) | NASAL | Status: DC | PRN

## 2013-02-21 MED ORDER — MIDAZOLAM HCL 10 MG/2ML IJ SOLN
INTRAMUSCULAR | Status: DC | PRN
  Administered 2013-02-21 (×2): 2 mg via INTRAVENOUS

## 2013-02-21 MED ORDER — EPINEPHRINE HCL 0.1 MG/ML IJ SOSY
PREFILLED_SYRINGE | INTRAMUSCULAR | Status: DC | PRN
  Administered 2013-02-21: 0.5 mg via INTRAVENOUS

## 2013-02-21 NOTE — Op Note (Signed)
Name:  Meagan Garcia MRN:  [id number] DOB:  May 15, 1950  PROCEDURE NOTE  Procedure:   Flexible bronchoscopy (78295) Brushing (62130) of the bronchus intermedius mass Bronchial washing (86578) of the bronchus intermedius Endobronchial biopsy (46962) of the bronchus intermedius mass  Indications:  Lung mass  Consent:  Procedure, benefits, risks and alternatives discussed.  Questions answered.  Consent obtained.  Anesthesia:  Fentanyl / Versed  Procedure summary:  Appropriate equipment was assembled.  The patient was identified as Meagan Garcia.  Safety timeout was performed. The patient was placed supine and adequate level of sedation was assured.  Flexible bronchoscope was lubricated and inserted via the endotracheal tube.  Left lung airway appeared normal.  On the right side friable tumor was noted protruding form bronchus intermedius and occluding it as well as right upper lobe.  Washing, brushing and endobronchial biopsy samples were obtained.  Procedure ws stooped due to significant oozing form the surface of the the tumor and difficulty controlling patient's sedation / cough.  After hemostasis was assured, the bronchoscope was withdrawn.  Specimens sent:  As above  Complications:  No immediate complications were noted.  Moderate oozing form the tumor surface was controlled by administering topical Epinephrine via bronchoscope. Hemodynamic parameters and oxygenation remained stable throughout the procedure.  Patient was transferred to SDU for monitoring.  Estimated blood loss:  Less then 25 mL.  Orlean Bradford, M.D. Pulmonary and Critical Care Medicine [facility] Pager: [phone]  02/21/2013, 3:44 PM

## 2013-02-21 NOTE — Progress Notes (Signed)
[facility] PROGRESS NOTE  Meagan Garcia ZOX:[id number] DOB: [birth year] DOA: 02/18/2013 PCP: Sonda Primes, MD  Brief narrative: 62 yo female who presented to [facility] ED 02/18/2013 with progressively worsening shortness of breath for past 2-3 weeks prior to this admission. Shortness of breath was prominent on exertion initially but now even at rest. There was also associated wheezing, malaise, fever, cough with hemoptysis. Pt explained she was treated for bronchitis but with no significant improvement.  In ED, pt was wheezing with RR in 30's that has responded well to BD's and oxygen. CT chest was worrisome for bronchogenic carcinoma.  Assessment and Plan:   Principal Problem:  Acute respiratory failure  - most likely secondary to postobstructive pneumonia, bronchogenic carcinoma  - appreciate PCCM input; bronch today - continue BD every 4 hours PRN shortness of breath or wheezing  - oxygen support via nasal canula to keep O2 saturation above 90%  - continue azithromycin and rocephin   Active Problems:  Lung mass  - management as noted above  - PCCM consulted  - oncology also consulted  Hyponatremia  - likely dehydration  - sodium improved with IV fluids  Acute renal failure  - likely pre renal  - improved to WNL with IV fluids  Anemia  - hemoglobin dropped from 12 to 10.7  - no indications for transusion   Code Status: Full  Family Communication: Family at the bedside  Disposition Plan: Remains inpatient   Consultants:  Pulmonary  Oncology Procedures:  None but bronchoscopy planned for today 02/21/2013 Antibiotics:  Azithromycin 02/18/2013 -->  Rocephin 02/18/2013 -->   Manson Passey, MD  [facility] Pager [phone]  If 7PM-7AM, please contact night-coverage [email or web address] Password [facility] 02/21/2013, 12:17 PM   LOS: 3 days    HPI/Subjective: No overnight events.   Objective: Filed Vitals:   02/20/13 1345 02/20/13 2049 02/20/13 2220 02/21/13  0504  BP: 117/59 120/57  129/62  Pulse: 86 92 88 94  Temp: 97.7 F (36.5 C) 98.8 F (37.1 C)  99.7 F (37.6 C)  TempSrc: Oral Oral  Oral  Resp: 18 16  16   Height:      Weight:      SpO2: 99% 97%  97%    Intake/Output Summary (Last 24 hours) at 02/21/13 1217 Last data filed at 02/21/13 0930  Gross per 24 hour  Intake 2987.5 ml  Output   3525 ml  Net -537.5 ml    Exam:   General:  Pt is alert, follows commands appropriately, not in acute distress  Cardiovascular: Regular rate and rhythm, S1/S2 appreciated  Respiratory: congestion, inspiratory stridor   Abdomen: Soft, non tender, non distended, bowel sounds present, no guarding  Extremities: No edema, pulses DP and PT palpable bilaterally  Neuro: Grossly nonfocal  Data Reviewed: Basic Metabolic Panel:  Recent Labs Lab 02/18/13 1604 02/19/13 0405  NA 131* 133*  K 4.3 4.1  CL 95* 101  CO2 23 21  GLUCOSE 84 110*  BUN 11 9  CREATININE 1.20* 0.94  CALCIUM 9.4 8.7   Liver Function Tests:  Recent Labs Lab 02/18/13 1604  AST 19  ALT 33  ALKPHOS 118*  BILITOT 0.6  PROT 7.8  ALBUMIN 3.0*   No results found for this basename: LIPASE, AMYLASE,  in the last 168 hours No results found for this basename: AMMONIA,  in the last 168 hours CBC:  Recent Labs Lab 02/18/13 1604 02/19/13 0405  WBC 9.4 8.3  NEUTROABS 6.2  --   HGB 12.1  10.7*  HCT 37.6 33.1*  MCV 87.4 87.6  PLT 312 298   Cardiac Enzymes: No results found for this basename: CKTOTAL, CKMB, CKMBINDEX, TROPONINI,  in the last 168 hours BNP: No components found with this basename: POCBNP,  CBG: No results found for this basename: GLUCAP,  in the last 168 hours  Recent Results (from the past 240 hour(s))  CULTURE, BLOOD (ROUTINE X 2)     Status: None   Collection Time    02/18/13  8:35 PM      Result Value Range Status   Specimen Description BLOOD LEFT HAND   Final   Special Requests BOTTLES DRAWN AEROBIC ONLY 3CC   Final   Culture  Setup  Time     Final   Value: 02/19/2013 01:26     Performed at [facility]   Culture     Final   Value:        BLOOD CULTURE RECEIVED NO GROWTH TO DATE CULTURE WILL BE HELD FOR 5 DAYS BEFORE ISSUING A FINAL NEGATIVE REPORT     Performed at [facility]   Report Status PENDING   Incomplete  CULTURE, BLOOD (ROUTINE X 2)     Status: None   Collection Time    02/18/13  8:47 PM      Result Value Range Status   Specimen Description BLOOD LEFT HAND   Final   Special Requests BOTTLES DRAWN AEROBIC ONLY 2CC   Final   Culture  Setup Time     Final   Value: 02/19/2013 01:26     Performed at [facility]   Culture     Final   Value:        BLOOD CULTURE RECEIVED NO GROWTH TO DATE CULTURE WILL BE HELD FOR 5 DAYS BEFORE ISSUING A FINAL NEGATIVE REPORT     Performed at [facility]   Report Status PENDING   Incomplete  RESPIRATORY VIRUS PANEL     Status: None   Collection Time    02/18/13 10:49 PM      Result Value Range Status   Source - RVPAN NASOPHARYNGEAL   Final   Respiratory Syncytial Virus A NOT DETECTED   Final   Respiratory Syncytial Virus B NOT DETECTED   Final   Influenza A NOT DETECTED   Final   Influenza B NOT DETECTED   Final   Parainfluenza 1 NOT DETECTED   Final   Parainfluenza 2 NOT DETECTED   Final   Parainfluenza 3 NOT DETECTED   Final   Metapneumovirus NOT DETECTED   Final   Rhinovirus NOT DETECTED   Final   Adenovirus NOT DETECTED   Final   Influenza A H1 NOT DETECTED   Final   Influenza A H3 NOT DETECTED   Final   Comment: (NOTE)           Normal Reference Range for each Analyte: NOT DETECTED     Testing performed using the Luminex xTAG Respiratory Viral Panel test     kit.     This test was developed and its performance characteristics determined     by [facility]. It has not been cleared or approved by the [place]     Food and Drug Administration. This test is used for clinical purposes.     It should not be regarded as  investigational or for research. This     laboratory is certified under the Clinical Laboratory Improvement  Amendments of 1988 (CLIA) as qualified to perform high complexity     clinical laboratory testing.     Performed at [facility]  CULTURE, EXPECTORATED SPUTUM-ASSESSMENT     Status: None   Collection Time    02/19/13  6:08 AM      Result Value Range Status   Specimen Description SPUTUM   Final   Special Requests NONE   Final   Sputum evaluation     Final   Value: MICROSCOPIC FINDINGS SUGGEST THAT THIS SPECIMEN IS NOT REPRESENTATIVE OF LOWER RESPIRATORY SECRETIONS. PLEASE RECOLLECT.     CALLED TO BALDWIN,R/3E @0703  ON 02/19/13 BY KARCZEWSKI,S.   Report Status 02/19/2013 FINAL   Final  CULTURE, EXPECTORATED SPUTUM-ASSESSMENT     Status: None   Collection Time    02/19/13 10:48 AM      Result Value Range Status   Specimen Description SPUTUM   Final   Special Requests Normal   Final   Sputum evaluation     Final   Value: MICROSCOPIC FINDINGS SUGGEST THAT THIS SPECIMEN IS NOT REPRESENTATIVE OF LOWER RESPIRATORY SECRETIONS. PLEASE RECOLLECT.     NOTIFIED BLACK,K AT 1140 ON [id number] BY HOOKER,B   Report Status 02/19/2013 FINAL   Final  CULTURE, EXPECTORATED SPUTUM-ASSESSMENT     Status: None   Collection Time    02/19/13  3:00 PM      Result Value Range Status   Specimen Description SPUTUM   Final   Special Requests Normal   Final   Sputum evaluation     Final   Value: MICROSCOPIC FINDINGS SUGGEST THAT THIS SPECIMEN IS NOT REPRESENTATIVE OF LOWER RESPIRATORY SECRETIONS. PLEASE RECOLLECT.     NOTIFIED KIM ATTENDING AT 1545 ON [id number] BY HOOKER,B   Report Status 02/19/2013 FINAL   Final     Studies: No results found.  Scheduled Meds: . azithromycin  500 mg Oral Daily  . buPROPion  150 mg Oral Daily  . carvedilol  12.5 mg Oral BID WC  . cefTRIAXone (ROCEPHIN)  IV  1 g Intravenous Q24H  . docusate sodium  100 mg Oral BID  . loratadine  10 mg Oral Daily  .  spironolactone  25 mg Oral Daily   Continuous Infusions:

## 2013-02-21 NOTE — Progress Notes (Signed)
PULMONARY  / CRITICAL CARE MEDICINE  Name: Meagan Garcia MRN: [id number] DOB: 28-Apr-1950    ADMISSION DATE:  02/18/2013 CONSULTATION DATE:  02/19/13  REFERRING MD :  [facility]/ Dr Elisabeth Pigeon PRIMARY SERVICE:  [facility] CHIEF COMPLAINT:  Lung mass  BRIEF PATIENT DESCRIPTION: 62 yo F former smoker with malaise, cough, exertional dyspnea x 2-3 weeks. Onset of wheeze, cough with spot hemoptysis. CT shows R hilar mass obstructing RUL bronchus. PCCM consulted for management assistance.  SIGNIFICANT EVENTS / STUDIES:  CT chest- 02/18/13  LINES / TUBES:   CULTURES: Sputum Cx 02/19/13  ANTIBIOTICS: Zith 12/12>> ceft 12/12/>>  SUBJECTIVE:    VITAL SIGNS: Temp:  [97.7 F (36.5 C)-99.7 F (37.6 C)] 99.7 F (37.6 C) (12/15 0504) Pulse Rate:  [86-94] 94 (12/15 0504) Resp:  [16-18] 16 (12/15 0504) BP: (117-129)/(57-62) 129/62 mmHg (12/15 0504) SpO2:  [92 %-99 %] 97 % (12/15 0504)  PHYSICAL EXAMINATION: General:  Obese, pleasant, appropriate AAF Neuro:  PERRLA, speech clear, tongue midline, no-lateralizing HEENT: Pupils are equal, mucosa normal Neck:  Inspiratory stridor, no JVD Cardiovascular:  RRR, no m/g/r Lungs:  Inspiratory wheeze R upper anterior chest Abdomen: Soft, non-tender, no HSM, BS + Musculoskeletal:  Normal apparent strength Skin:  clear   Recent Labs Lab 02/18/13 1604 02/19/13 0405  NA 131* 133*  K 4.3 4.1  CL 95* 101  CO2 23 21  BUN 11 9  CREATININE 1.20* 0.94  GLUCOSE 84 110*    Recent Labs Lab 02/18/13 1604 02/19/13 0405  HGB 12.1 10.7*  HCT 37.6 33.1*  WBC 9.4 8.3  PLT 312 298   No results found.  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   Plan For diagnostic FOB 12/15  02/21/2013, 9:58 AM

## 2013-02-21 NOTE — Progress Notes (Signed)
Video bronchoscopy done  Intervention bronchial biopsy  Intervention bronchial  Washing Intervention bronchial brushing done  Procedure tolerated well

## 2013-02-21 NOTE — Telephone Encounter (Signed)
Per pt's chart, bronch already done earlier today Will sign off.

## 2013-02-21 NOTE — Progress Notes (Signed)
Patient ordered a respiratory virus panel--placed on droplet precautions til resulted.

## 2013-02-21 NOTE — Telephone Encounter (Signed)
Brett Canales Minor paged @ [phone]

## 2013-02-21 NOTE — Telephone Encounter (Signed)
Meagan Garcia from [facility] called again in ref to previous msg can be reached at [phone].Meagan Garcia

## 2013-02-21 NOTE — Progress Notes (Signed)
Respiratory virus antigen panel negative--discontinued droplet precautions.

## 2013-02-21 NOTE — Telephone Encounter (Signed)
Please call Devra Dopp, our NP at hospital, and ask him to help with this. If a physician isn't free to bronchoscope her today, then she needs to be fed and her lovenox restarted.

## 2013-02-21 NOTE — Telephone Encounter (Signed)
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 It became symptomatic as it began to occlude RUL. 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 There has been speck hemoptysis indicating some direct airway invasion. Standard bronchoscopy should be diagnostic. This is not available over week-end. I have carefully discussed the procedure, commonly considered risks especially including, bleeding, cardiorespiratory distress, and un-expected death. I answered her questions to the best of my ability and she indicated understanding. I will discuss with team for bronchoscopy tentatively 12/15-Monday.   There is another mention of bronch on "Monday 02/22/13" in the 12.14.14 progress note by Dr Elisabeth Pigeon. Called [phone] and spoke with patient's nurse Stark Klein who reported that pt was placed on NPO in anticipatation for bronch this morning but no order was placed and respiratory reported pt is not on schedule.  Roger Shelter to page the physician on call to check on the status of this as CDY is seeing patients this morning.  However, will still send to CDY.  Dr Maple Hudson please advise, thank you.

## 2013-02-22 ENCOUNTER — Telehealth: Payer: Self-pay | Admitting: *Deleted

## 2013-02-22 ENCOUNTER — Encounter (HOSPITAL_COMMUNITY): Payer: Self-pay | Admitting: Pulmonary Disease

## 2013-02-22 DIAGNOSIS — R222 Localized swelling, mass and lump, trunk: Secondary | ICD-10-CM

## 2013-02-22 DIAGNOSIS — R0602 Shortness of breath: Secondary | ICD-10-CM

## 2013-02-22 DIAGNOSIS — R918 Other nonspecific abnormal finding of lung field: Secondary | ICD-10-CM

## 2013-02-22 DIAGNOSIS — J9819 Other pulmonary collapse: Secondary | ICD-10-CM

## 2013-02-22 NOTE — Telephone Encounter (Signed)
Called and spoke with pt regarding mtoc appt 02/24/13 at 2:45.  She verbalized understanding of time and place of appt

## 2013-02-22 NOTE — Telephone Encounter (Signed)
Called left vm message regarding appt for mtoc 02/24/13.  Left name and phone number to call back

## 2013-02-22 NOTE — Plan of Care (Signed)
Oxygen sats on room air 89 to 92%. It was reported by night shift that pt desats while sleeping and required 2L [place] of O2. May need to further assess for home needs.

## 2013-02-22 NOTE — Progress Notes (Addendum)
PULMONARY  / CRITICAL CARE MEDICINE  Name: Meagan Garcia MRN: [id number] DOB: 08/28/1950    ADMISSION DATE:  02/18/2013 CONSULTATION DATE:  02/19/13  REFERRING MD :  [facility]/ Dr Elisabeth Pigeon PRIMARY SERVICE:  [facility] CHIEF COMPLAINT:  Lung mass  BRIEF PATIENT DESCRIPTION: 62 yo F former smoker with malaise, cough, exertional dyspnea x 2-3 weeks. Onset of wheeze, cough with spot hemoptysis. CT shows R hilar mass obstructing RUL bronchus. PCCM consulted for management assistance.  SIGNIFICANT EVENTS / STUDIES:  CT chest- 02/18/13 > Large R suprahilar mass obstructing RUL bronchus, bulky lymphadenopathy FOB - 02/22/13 > R BI mass, bled easily  LINES / TUBES:   CULTURES: Sputum Cx 02/19/13  ANTIBIOTICS: Zith 12/12>> ceft 12/12/>>  SUBJECTIVE: Feels well, minimal hemoptysis   VITAL SIGNS: Temp:  [98.2 F (36.8 C)-99.6 F (37.6 C)] 98.2 F (36.8 C) (12/16 0332) Pulse Rate:  [80-114] 80 (12/16 0600) Resp:  [16-28] 17 (12/16 0600) BP: (89-164)/(37-107) 120/51 mmHg (12/16 0600) SpO2:  [90 %-99 %] 96 % (12/16 0600) Weight:  [102.6 kg (226 lb 3.1 oz)] 102.6 kg (226 lb 3.1 oz) (12/16 0332)  PHYSICAL EXAMINATION:  Gen: comfortable, no acute distress HEENT : NCAT, EOMi PULM diminished RUL, rhonchi RLL, clear on left CV: RRR, no mgr AB: BS+, soft, nontender Ext: trace edema Neuro: Awake and alert, maew   Recent Labs Lab 02/18/13 1604 02/19/13 0405  NA 131* 133*  K 4.3 4.1  CL 95* 101  CO2 23 21  BUN 11 9  CREATININE 1.20* 0.94  GLUCOSE 84 110*    Recent Labs Lab 02/18/13 1604 02/19/13 0405  HGB 12.1 10.7*  HCT 37.6 33.1*  WBC 9.4 8.3  PLT 312 298   No results found.  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 Doubt pneumonia given normal WBC and temp.  Plan -f/u pathology results from 12/15 bronch -would d/c ABX -would transfer to [facility] PCCM Pager: [phone] Cell: [phone] If no response, call [phone]   02/22/2013, 8:35 AM

## 2013-02-22 NOTE — Progress Notes (Signed)
[facility] PROGRESS NOTE  ARLYN BUMPUS QMV:[id number] DOB: 1950-07-05 DOA: 02/18/2013 PCP: Sonda Primes, MD  Brief narrative: 62 yo female who presented to [facility] ED 02/18/2013 with progressively worsening shortness of breath for past 2-3 weeks prior to this admission. Shortness of breath was prominent on exertion initially but now even at rest. There was also associated wheezing, malaise, fever, cough with hemoptysis. Pt explained she was treated for bronchitis but with no significant improvement.  In ED, pt was wheezing with RR in 30's that has responded well to BD's and oxygen. CT chest was worrisome for bronchogenic carcinoma. Pt underwent bronchoscopy 02/21/2013 and findings are highly suspicious for carcinoma. Path results pending.   Assessment and Plan:   Principal Problem:  Acute respiratory failure  - most likely secondary to postobstructive pneumonia, bronchogenic carcinoma  - appreciate PCCM input; bronch done 02/21/2013, awaiting pathology results - continue BD every 4 hours PRN shortness of breath or wheezing  - oxygen support via nasal canula to keep O2 saturation above 90%  - Patient has received azithromycin and Rocephin since admission but we will discontinue antibiotics today per pulmonary recommendations. Active Problems:  Lung mass  - management as noted above  - PCCM consulted  - oncology also consulted  Hyponatremia  - likely dehydration  - sodium improved with IV fluids  Acute renal failure  - likely pre renal  - improved to WNL with IV fluids  Anemia  - hemoglobin dropped from 12 to 10.7  - no indications for transusion   Code Status: Full  Family Communication: Family at the bedside  Disposition Plan: Remains inpatient; transfer to med floor   Consultants:  Pulmonary  Oncology Procedures:  Bronchoscopy - 02/21/2013 Antibiotics:  Azithromycin 02/18/2013 --> 02/22/2013 Rocephin 02/18/2013 --> 02/22/2013  Manson Passey, MD  [facility] Pager [phone]  If 7PM-7AM, please contact night-coverage [email or web address] Password [email or web address] 02/22/2013, 12:21 PM   LOS: 4 days    HPI/Subjective: No overnight events.  Objective: Filed Vitals:   02/22/13 0400 02/22/13 0600 02/22/13 0800 02/22/13 1000  BP: 123/68 120/51 125/55 136/60  Pulse: 86 80 83 91  Temp:   97.5 F (36.4 C)   TempSrc:   Oral   Resp: 22 17 20 22   Height:      Weight:      SpO2: 96% 96% 94% 95%    Intake/Output Summary (Last 24 hours) at 02/22/13 1221 Last data filed at 02/22/13 1000  Gross per 24 hour  Intake    950 ml  Output    950 ml  Net      0 ml    Exam:   General:  Pt is alert, follows commands appropriately, not in acute distress  Cardiovascular: Regular rate and rhythm, S1/S2 appreciated  Respiratory: Inspiratory stridor appreciated   Abdomen: Soft, non tender, non distended, bowel sounds present, no guarding  Extremities: No edema, pulses DP and PT palpable bilaterally  Neuro: Grossly nonfocal  Data Reviewed: Basic Metabolic Panel:  Recent Labs Lab 02/18/13 1604 02/19/13 0405  NA 131* 133*  K 4.3 4.1  CL 95* 101  CO2 23 21  GLUCOSE 84 110*  BUN 11 9  CREATININE 1.20* 0.94  CALCIUM 9.4 8.7   Liver Function Tests:  Recent Labs Lab 02/18/13 1604  AST 19  ALT 33  ALKPHOS 118*  BILITOT 0.6  PROT 7.8  ALBUMIN 3.0*   No results found for this basename: LIPASE, AMYLASE,  in the last 168 hours No results found  for this basename: AMMONIA,  in the last 168 hours CBC:  Recent Labs Lab 02/18/13 1604 02/19/13 0405  WBC 9.4 8.3  NEUTROABS 6.2  --   HGB 12.1 10.7*  HCT 37.6 33.1*  MCV 87.4 87.6  PLT 312 298   Cardiac Enzymes: No results found for this basename: CKTOTAL, CKMB, CKMBINDEX, TROPONINI,  in the last 168 hours BNP: No components found with this basename: POCBNP,  CBG: No results found for this basename: GLUCAP,  in the last 168 hours  CULTURE, BLOOD (ROUTINE X 2)     Status: None    Collection Time    02/18/13  8:35 PM      Result Value Range Status   Specimen Description BLOOD LEFT HAND   Final   Special Requests BOTTLES DRAWN AEROBIC ONLY 3CC   Final   Culture  Setup Time     Final   Value: 02/19/2013 01:26     Performed at [facility]   Culture     Final   Value:        BLOOD CULTURE RECEIVED NO GROWTH TO DATE CULTURE WILL BE HELD FOR 5 DAYS BEFORE ISSUING A FINAL NEGATIVE REPORT     Performed at [facility]   Report Status PENDING   Incomplete  CULTURE, BLOOD (ROUTINE X 2)     Status: None   Collection Time    02/18/13  8:47 PM      Result Value Range Status   Specimen Description BLOOD LEFT HAND   Final   Special Requests BOTTLES DRAWN AEROBIC ONLY 2CC   Final   Culture  Setup Time     Final   Value: 02/19/2013 01:26     Performed at [facility]   Culture     Final   Value:        BLOOD CULTURE RECEIVED NO GROWTH TO DATE CULTURE WILL BE HELD FOR 5 DAYS BEFORE ISSUING A FINAL NEGATIVE REPORT     Performed at [facility]   Report Status PENDING   Incomplete  RESPIRATORY VIRUS PANEL     Status: None   Collection Time    02/18/13 10:49 PM      Result Value Range Status   Source - RVPAN NASOPHARYNGEAL   Final   Respiratory Syncytial Virus A NOT DETECTED   Final   Respiratory Syncytial Virus B NOT DETECTED   Final   Influenza A NOT DETECTED   Final   Influenza B NOT DETECTED   Final   Parainfluenza 1 NOT DETECTED   Final   Parainfluenza 2 NOT DETECTED   Final   Parainfluenza 3 NOT DETECTED   Final   Metapneumovirus NOT DETECTED   Final   Rhinovirus NOT DETECTED   Final   Adenovirus NOT DETECTED   Final   Influenza A H1 NOT DETECTED   Final   Influenza A H3 NOT DETECTED   Final   Comment: (NOTE)           Normal Reference Range for each Analyte: NOT DETECTED     Testing performed using the Luminex xTAG Respiratory Viral Panel test     kit.     This test was developed and its performance characteristics determined      by [facility]. It has not been cleared or approved by the [place]     Food and Drug Administration. This test is used for clinical purposes.     It should  not be regarded as investigational or for research. This     laboratory is certified under the Clinical Laboratory Improvement     Amendments of 1988 (CLIA) as qualified to perform high complexity     clinical laboratory testing.     Performed at [facility]  CULTURE, EXPECTORATED SPUTUM-ASSESSMENT     Status: None   Collection Time    02/19/13  6:08 AM      Result Value Range Status   Specimen Description SPUTUM   Final   Special Requests NONE   Final   Sputum evaluation     Final   Value: MICROSCOPIC FINDINGS SUGGEST THAT THIS SPECIMEN IS NOT REPRESENTATIVE OF LOWER RESPIRATORY SECRETIONS. PLEASE RECOLLECT.     CALLED TO BALDWIN,R/3E @0703  ON 02/19/13 BY KARCZEWSKI,S.   Report Status 02/19/2013 FINAL   Final  CULTURE, EXPECTORATED SPUTUM-ASSESSMENT     Status: None   Collection Time    02/19/13 10:48 AM      Result Value Range Status   Specimen Description SPUTUM   Final   Special Requests Normal   Final   Sputum evaluation     Final   Value: MICROSCOPIC FINDINGS SUGGEST THAT THIS SPECIMEN IS NOT REPRESENTATIVE OF LOWER RESPIRATORY SECRETIONS. PLEASE RECOLLECT.     NOTIFIED BLACK,K AT 1140 ON [id number] BY HOOKER,B   Report Status 02/19/2013 FINAL   Final  CULTURE, EXPECTORATED SPUTUM-ASSESSMENT     Status: None   Collection Time    02/19/13  3:00 PM      Result Value Range Status   Specimen Description SPUTUM   Final   Special Requests Normal   Final   Sputum evaluation     Final   Value: MICROSCOPIC FINDINGS SUGGEST THAT THIS SPECIMEN IS NOT REPRESENTATIVE OF LOWER RESPIRATORY SECRETIONS. PLEASE RECOLLECT.     NOTIFIED KIM ATTENDING AT 1545 ON [id number] BY HOOKER,B   Report Status 02/19/2013 FINAL   Final  MRSA PCR SCREENING     Status: None   Collection Time    02/21/13  4:36 PM      Result Value Range Status    MRSA by PCR NEGATIVE  NEGATIVE Final     Studies: No results found.  Scheduled Meds: . buPROPion  150 mg Oral Daily  . carvedilol  12.5 mg Oral BID WC  . docusate sodium  100 mg Oral BID  . loratadine  10 mg Oral Daily  . spironolactone  25 mg Oral Daily

## 2013-02-23 DIAGNOSIS — F411 Generalized anxiety disorder: Secondary | ICD-10-CM

## 2013-02-23 MED ORDER — TIOTROPIUM BROMIDE MONOHYDRATE 18 MCG IN CAPS: 18.0000 ug | ORAL_CAPSULE | Freq: Every day | RESPIRATORY_TRACT | Status: DC

## 2013-02-23 MED ORDER — ALBUTEROL SULFATE HFA 108 (90 BASE) MCG/ACT IN AERS: 2.0000 | INHALATION_SPRAY | Freq: Four times a day (QID) | RESPIRATORY_TRACT | Status: DC | PRN

## 2013-02-23 MED ORDER — VITAMIN D 1000 UNITS PO TABS: 1000.0000 [IU] | ORAL_TABLET | Freq: Every day | ORAL | Status: AC

## 2013-02-23 MED ORDER — ALBUTEROL SULFATE HFA 108 (90 BASE) MCG/ACT IN AERS
2.0000 | INHALATION_SPRAY | Freq: Four times a day (QID) | RESPIRATORY_TRACT | Status: DC
  Filled 2013-02-23: qty 6.7

## 2013-02-23 NOTE — Progress Notes (Signed)
Pt sats 91% on RA while ambulating up the hall and back. No distress at this time.

## 2013-02-24 ENCOUNTER — Encounter: Payer: Self-pay | Admitting: Radiation Oncology

## 2013-02-24 ENCOUNTER — Encounter: Payer: Self-pay | Admitting: *Deleted

## 2013-02-24 ENCOUNTER — Telehealth: Payer: Self-pay | Admitting: Internal Medicine

## 2013-02-24 ENCOUNTER — Ambulatory Visit
Admit: 2013-02-24 | Discharge: 2013-02-24 | Disposition: A | Discharge status: Home / Self Care | Payer: BC Managed Care – PPO | Attending: Radiation Oncology | Admitting: Radiation Oncology

## 2013-02-24 ENCOUNTER — Ambulatory Visit (HOSPITAL_BASED_OUTPATIENT_CLINIC_OR_DEPARTMENT_OTHER): Payer: BC Managed Care – PPO | Admitting: Internal Medicine

## 2013-02-24 ENCOUNTER — Ambulatory Visit: Payer: BC Managed Care – PPO | Attending: Internal Medicine | Admitting: Physical Therapy

## 2013-02-24 VITALS — BP 129/73 | HR 96 | Temp 99.5°F | Resp 18 | Ht 65.0 in | Wt 216.3 lb

## 2013-02-24 DIAGNOSIS — C3491 Malignant neoplasm of unspecified part of right bronchus or lung: Secondary | ICD-10-CM

## 2013-02-24 DIAGNOSIS — R29898 Other symptoms and signs involving the musculoskeletal system: Secondary | ICD-10-CM | POA: Insufficient documentation

## 2013-02-24 DIAGNOSIS — R5381 Other malaise: Secondary | ICD-10-CM | POA: Insufficient documentation

## 2013-02-24 DIAGNOSIS — C3400 Malignant neoplasm of unspecified main bronchus: Secondary | ICD-10-CM

## 2013-02-24 DIAGNOSIS — C3490 Malignant neoplasm of unspecified part of unspecified bronchus or lung: Secondary | ICD-10-CM | POA: Insufficient documentation

## 2013-02-24 DIAGNOSIS — IMO0001 Reserved for inherently not codable concepts without codable children: Secondary | ICD-10-CM | POA: Insufficient documentation

## 2013-02-24 NOTE — Progress Notes (Signed)
[facility] [phone]   [phone]) [phone]  CONSULT NOTE  REFERRING PHYSICIAN: Dr. Jetty Duhamel.  REASON FOR CONSULTATION:  62 years old [place] American female recently diagnosed with lung cancer.  HPI Meagan Garcia is a 62 y.o. female with past medical history significant for anxiety, depression, GERD and long history of smoking but quit 17 years ago. The patient mentions that for the last 2-3 weeks he has been complaining of increasing dyspnea, wheezes, malaise and cough with blood tinged sputum production. She was seen by her primary care physician and chest x-ray was performed on 02/15/2013 and it showed markedly fullness of the right hilum and right upper lobe airspace opacity questionable for pneumonia versus lung carcinoma. This was followed by CT scan of the chest on 02/18/2013 after the patient was admitted to [facility] with worsening dyspnea and it showed a right suprahilar mass obstructing the right upper lobe bronchus. This mass extends into the mediastinum and measures 3.7 by 3.8 cm in axial dimension and extends 7 cm in craniocaudad dimension along the right paratracheal location. The mass encroaches upon the bronchus intermedius narrowing this bronchus. The mass abuts the right pulmonary artery. Large subcarinal lymph node measures 30 mm. There is a large right hilar lymph node measuring 18 mm. Potentially enlarged left hilar lymph node measures 21 mm. Review of the lung parenchyma demonstrates collapse of the right upper lobe. On 02/21/2013 the patient underwent flexible bronchoscopy, brushing of the bronchus intermedius mass, bronchial washing of the bronchus intermedius as well as endobronchial biopsy of the bronchus intermedius mass under the care of Dr.Zubelevitskiy. The final pathology (Accession: [id number]) showed invasive squamous cell carcinoma. Dr. Maple Hudson kindly referred the patient to me today for further evaluation and  recommendation regarding treatment of her condition. When seen today the patient is feeling fine except for the shortness of breath which increased with exertion. She also has cough productive of whitish sputum and wheezes. She lost around 9 pounds in the last 2 weeks. The patient also complains of headache but no visual changes. Her family history is significant for a mother and father as well as 2 nieces with lung cancer.  The patient is married and has 2 children. She was accompanied by her daughter Meagan Garcia and her son Meagan Garcia. She has a history of smoking one pack per day for around 30 years and she quit 17 years ago. She has no history of alcohol or drug abuse.  HPI  Past Medical History  Diagnosis Date  . Anxiety   . Depression   . Peripheral neuropathy     feet: unknown etiol  . Elevated glucose   . Allergic rhinitis   . GERD (gastroesophageal reflux disease)   . Complication of anesthesia w/tubal    got anest. "went crazy"     Past Surgical History  Procedure Laterality Date  . Abdominal hysterectomy      partial  . Tubal reversal    . Tubal ligation      x2  . Fibroid tumors    . Knee arthroscopy    . Foot surgery      cysts from both feet  . Video bronchoscopy Bilateral 02/21/2013    Procedure: VIDEO BRONCHOSCOPY WITHOUT FLUORO;  Surgeon: Lonia Farber, MD;  Location: [facility];  Service: Cardiopulmonary;  Laterality: Bilateral;    Family History  Problem Relation Age of Onset  . Hypertension Other   . Hypertension Mother   . Cancer Mother 44  lung ca  . Cancer Father     Social History History  Substance Use Topics  . Smoking status: Former Smoker -- 40 years    Quit date: 02/19/1996  . Smokeless tobacco: Never Used  . Alcohol Use: No    No Known Allergies  Current Outpatient Prescriptions  Medication Sig Dispense Refill  . albuterol (PROVENTIL HFA;VENTOLIN HFA) 108 (90 BASE) MCG/ACT inhaler Inhale 2 puffs into the lungs every 6 (six)  hours as needed for wheezing or shortness of breath.  1 Inhaler  2  . buPROPion (WELLBUTRIN SR) 150 MG 12 hr tablet Take 150 mg by mouth daily.      . carvedilol (COREG) 12.5 MG tablet Take 1 tablet (12.5 mg total) by mouth 2 (two) times daily with a meal.  180 tablet  3  . chlorpheniramine-HYDROcodone (TUSSIONEX PENNKINETIC ER) 10-8 MG/5ML LQCR Take 5 mLs by mouth every 12 (twelve) hours as needed.  115 mL  0  . cholecalciferol (VITAMIN D) 1000 UNITS tablet Take 1 tablet (1,000 Units total) by mouth daily.  30 tablet  0  . fluticasone (FLONASE) 50 MCG/ACT nasal spray Place 2 sprays into the nose daily.  48 g  3  . LORazepam (ATIVAN) 1 MG tablet TAKE ONE TABLET BY MOUTH TWICE DAILY  180 tablet  0  . spironolactone (ALDACTONE) 25 MG tablet Take 1 tablet (25 mg total) by mouth daily.  90 tablet  3  . tiotropium (SPIRIVA HANDIHALER) 18 MCG inhalation capsule Place 1 capsule (18 mcg total) into inhaler and inhale daily.  30 capsule  0  . loratadine (CLARITIN) 10 MG tablet Take 1 tablet (10 mg total) by mouth daily.  100 tablet  3   No current facility-administered medications for this visit.    Review of Systems  Constitutional: negative Eyes: negative Ears, nose, mouth, throat, and face: negative Respiratory: positive for cough, dyspnea on exertion, sputum and wheezing Cardiovascular: negative Gastrointestinal: negative Genitourinary:negative Integument/breast: negative Hematologic/lymphatic: negative Musculoskeletal:negative Neurological: negative Behavioral/Psych: negative Endocrine: negative Allergic/Immunologic: negative  Physical Exam  WUJ:WJXBJ, healthy, no distress, well nourished and well developed SKIN: skin color, texture, turgor are normal, no rashes or significant lesions HEAD: Normocephalic, No masses, lesions, tenderness or abnormalities EYES: normal, PERRLA EARS: External ears normal, Canals clear OROPHARYNX:no exudate, no erythema and lips, buccal mucosa, and tongue  normal  NECK: supple, no adenopathy, no JVD LYMPH:  no palpable lymphadenopathy, no hepatosplenomegaly BREAST:breasts appear normal, no suspicious masses, no skin or nipple changes or axillary nodes LUNGS: clear to auscultation , and palpation HEART: regular rate & rhythm and no murmurs ABDOMEN:abdomen soft, non-tender, normal bowel sounds and no masses or organomegaly BACK: Back symmetric, no curvature., No CVA tenderness EXTREMITIES:no joint deformities, effusion, or inflammation, no edema  NEURO: alert & oriented x 3 with fluent speech, no focal motor/sensory deficits  PERFORMANCE STATUS: ECOG 1  LABORATORY DATA: Lab Results  Component Value Date   WBC 8.3 02/19/2013   HGB 10.7* 02/19/2013   HCT 33.1* 02/19/2013   MCV 87.6 02/19/2013   PLT 298 02/19/2013      Chemistry      Component Value Date/Time   NA 133* 02/19/2013 0405   K 4.1 02/19/2013 0405   CL 101 02/19/2013 0405   CO2 21 02/19/2013 0405   BUN 9 02/19/2013 0405   CREATININE 0.94 02/19/2013 0405      Component Value Date/Time   CALCIUM 8.7 02/19/2013 0405   ALKPHOS 118* 02/18/2013 1604   AST 19 02/18/2013  1604   ALT 33 02/18/2013 1604   BILITOT 0.6 02/18/2013 1604       RADIOGRAPHIC STUDIES: Dg Chest 2 View  02/15/2013   CLINICAL DATA:  Shortness of breath and cough.  Former smoker.  EXAM: CHEST  2 VIEW  COMPARISON:  None.  FINDINGS: There is right upper lobe airspace opacity and marked fullness of the right hilum. The left lung appears clear. Heart size is normal. No pneumothorax or pleural fluid.  IMPRESSION: Marked fullness of the right hilum and right upper lobe airspace opacity could be due to pneumonia but is worrisome for lung carcinoma. Chest CT with contrast is recommended for further evaluation.  These results will be called to the ordering clinician or representative by the Radiologist Assistant, and communication documented in the PACS Dashboard.   Electronically Signed   By: Drusilla Kanner M.D.    On: 02/15/2013 16:35   Ct Chest W Contrast  02/18/2013   CLINICAL DATA:  Abnormal chest radiograph.  Smoker  EXAM: CT CHEST WITH CONTRAST  TECHNIQUE: Multidetector CT imaging of the chest was performed during intravenous contrast administration.  CONTRAST:  75mL OMNIPAQUE IOHEXOL 300 MG/ML  SOLN  COMPARISON:  DG CHEST 2 VIEW dated 02/15/2013  FINDINGS: There is a right suprahilar mass obstructing the right upper lobe bronchus. This mass extends into the mediastinum and measures 3.7 by 3.8 cm in axial dimension (image 20) and extends 7 cm in craniocaudad dimension along the right paratracheal location. The mass encroaches upon the bronchus intermedius narrowing this bronchus (image 20). The mass abuts the right pulmonary artery. Large subcarinal lymph node measures 30 mm. There is a large right hilar lymph node measuring 18 mm. Potentially enlarged left hilar lymph node measures 21 mm.  Review of the lung parenchyma demonstrates collapse of the right upper lobe. No additional pulmonary nodularity.  Limited view of the upper abdomen partially images the adrenal glands. No mass seen. No focal hepatic lesion. Review of the bone windows demonstrates degenerative change of the spine. No aggressive osseous lesion.  IMPRESSION: 1. 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 2. Bulky sub carinal adenopathy 3. Right hilar adenopathy a potential left hilar adenopathy. 4. Recommend FDG PET scanning for staging and oncology consultation. This was made a call report.   Electronically Signed   By: Genevive Bi M.D.   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   I will complete the staging workup by ordering a PET scan as well as MRI of the brain to rule out any other metastatic disease. If the patient has no evidence for metastatic disease, I would consider her for treatment with concurrent chemoradiation with weekly carboplatin for AUC of 2 and paclitaxel 45 mg/M2 for a total of 6-7 weeks depending on the final dose of radiation. I discussed with the patient adverse effect of the chemotherapy including but not limited to alopecia, myelosuppression, nausea and vomiting, peripheral neuropathy, liver or renal dysfunction. I expect the patient to start the first dose of her  systemic chemotherapy on the week of 03/07/2013. I will arrange for the patient to have a chemotherapy education class before starting the first dose of her chemotherapy. The patient was seen earlier today by Dr. Roselind Messier for evaluation and discussion of her radiotherapy option. She would come back for follow up visit in 3 weeks for evaluation and management any adverse effect of her treatment. I will call her pharmacy with prescription for Compazine 10 mg by mouth every 6 hours as needed for nausea. I gave the patient and her family the time to ask questions and I answered them completely to their satisfaction. She was advised to call immediately if she has any concerning symptoms in the interval.  The patient voices understanding of current disease status and treatment options and is in agreement with the current care plan.  All questions were answered. The patient knows to call the clinic with any problems, questions or concerns. We can certainly see the patient much sooner if necessary.  Thank you so much for allowing me to participate in the care of Meagan Garcia. I will continue to follow up the  patient with you and assist in her care.  I spent 55 minutes counseling the patient face to face. The total time spent in the appointment was 70 minutes.  Jermiah Howton K. 02/24/2013, 5:14 PM

## 2013-02-24 NOTE — Progress Notes (Signed)
Radiation Oncology         [phone] ________________________________  Initial outpatient Consultation  Name: Meagan Garcia MRN: [id number]  Date: 02/24/2013  DOB: 26-Mar-1950  CC:Sonda Primes, MD  Rodena Medin*   REFERRING PHYSICIAN: Rodena Medin*  DIAGNOSIS: Stage III-B non-small cell lung cancer (T2b, N3, Mx)  HISTORY OF PRESENT ILLNESS::Meagan Garcia is a 62 y.o. female who is seen out courtesy of Dr. Marin Shutter for an opinion concerning radiation therapy as part of the management of patient's recently diagnosed non-small cell lung cancer. Patient presented to the [facility] long ED with progressively worsening shortness of breath, wheezing cough fever as well as some hemoptysis. On present in the emergency room the patient was noted to have a respiratory rate of approximately 30. A chest CT scan was performed which revealed a large right suprahilar mass which obstructed the right upper lobe bronchus and invading the bronchus intermedius. In addition there was bulky subcarinal adenopathy and right hilar adenopathy as well as probable left hilar adenopathy. The patient proceeded to undergo bronchoscopy with bronchial brushings revealing malignant cells consistent with non-small cell carcinoma. With these findings the patient is now seen in radiation oncology as part of the multidisciplinary thoracic oncology clinic.  PREVIOUS RADIATION THERAPY: No  PAST MEDICAL HISTORY:  has a past medical history of Anxiety; Depression; Peripheral neuropathy; Elevated glucose; Allergic rhinitis; GERD (gastroesophageal reflux disease); and Complication of anesthesia (w/tubal).    PAST SURGICAL HISTORY: Past Surgical History  Procedure Laterality Date  . Abdominal hysterectomy      partial  . Tubal reversal    . Tubal ligation      x2  . Fibroid tumors    . Knee arthroscopy    . Foot surgery      cysts from both feet  . Video bronchoscopy Bilateral 02/21/2013   Procedure: VIDEO BRONCHOSCOPY WITHOUT FLUORO;  Surgeon: Lonia Farber, MD;  Location: [facility] ENDOSCOPY;  Service: Cardiopulmonary;  Laterality: Bilateral;    FAMILY HISTORY: family history includes Cancer in her father; Cancer (age of onset: 75) in her mother; Hypertension in her mother and other.  SOCIAL HISTORY:  reports that she quit smoking about 17 years ago. She has never used smokeless tobacco. She reports that she does not drink alcohol or use illicit drugs.  ALLERGIES: Review of patient's allergies indicates no known allergies.  MEDICATIONS:  Current Outpatient Prescriptions  Medication Sig Dispense Refill  . albuterol (PROVENTIL HFA;VENTOLIN HFA) 108 (90 BASE) MCG/ACT inhaler Inhale 2 puffs into the lungs every 6 (six) hours as needed for wheezing or shortness of breath.  1 Inhaler  2  . buPROPion (WELLBUTRIN SR) 150 MG 12 hr tablet Take 150 mg by mouth daily.      . carvedilol (COREG) 12.5 MG tablet Take 1 tablet (12.5 mg total) by mouth 2 (two) times daily with a meal.  180 tablet  3  . chlorpheniramine-HYDROcodone (TUSSIONEX PENNKINETIC ER) 10-8 MG/5ML LQCR Take 5 mLs by mouth every 12 (twelve) hours as needed.  115 mL  0  . cholecalciferol (VITAMIN D) 1000 UNITS tablet Take 1 tablet (1,000 Units total) by mouth daily.  30 tablet  0  . fluticasone (FLONASE) 50 MCG/ACT nasal spray Place 2 sprays into the nose daily.  48 g  3  . loratadine (CLARITIN) 10 MG tablet Take 1 tablet (10 mg total) by mouth daily.  100 tablet  3  . LORazepam (ATIVAN) 1 MG tablet TAKE ONE TABLET BY MOUTH TWICE DAILY  180 tablet  0  . spironolactone (ALDACTONE) 25 MG tablet Take 1 tablet (25 mg total) by mouth daily.  90 tablet  3  . tiotropium (SPIRIVA HANDIHALER) 18 MCG inhalation capsule Place 1 capsule (18 mcg total) into inhaler and inhale daily.  30 capsule  0   No current facility-administered medications for this encounter.    REVIEW OF SYSTEMS:  A 15 point review of systems is documented in  the electronic medical record. This was obtained by the nursing staff. However, I reviewed this with the patient to discuss relevant findings and make appropriate changes.  She has had some more frequent headaches recently but denies any blurred vision or double vision. She complains of wheezing as well as cough with some hemoptysis. She also has generalized fatigue and some weight loss area she denies any new bony pain.   PHYSICAL EXAM:  vitals were not taken for this visit.  There were no vitals taken for this visit.  General Appearance:    Alert, cooperative, no distress, appears stated age  Head:    Normocephalic, without obvious abnormality, atraumatic  Eyes:    PERRL, conjunctiva/corneas clear, EOM's intact, fundi    benign, both eyes     Nose:   Nares normal, septum midline, mucosa normal, no drainage    or sinus tenderness  Throat:   Lips, mucosa, and tongue normal; teeth and gums normal  Neck:   Supple, symmetrical, trachea midline, no adenopathy;    thyroid:  no enlargement/tenderness/nodules; no carotid   bruit or JVD  Back:     Symmetric, no curvature, ROM normal, no CVA tenderness  Lungs:     significant wheezing noted throughout both lungs more so along the right side   Chest Wall:    No tenderness or deformity   Heart:    Regular rate and rhythm, S1 and S2 normal, no murmur, rub   or gallop     Abdomen:     Soft, non-tender, bowel sounds active all four quadrants,    no masses, no organomegaly        Extremities:   Extremities normal, atraumatic, no cyanosis or edema  Pulses:   2+ and symmetric all extremities  Skin:   Skin color, texture, turgor normal, no rashes or lesions  Lymph nodes:   Cervical, supraclavicular, and axillary nodes normal  Neurologic:   CNII-XII intact, normal strength,        ECOG = 1   1 - Symptomatic but completely ambulatory (Restricted in physically strenuous activity but ambulatory and able to carry out work of a light or sedentary nature.  For example, light housework, office work)   LABORATORY DATA:  Lab Results  Component Value Date   WBC 8.3 02/19/2013   HGB 10.7* 02/19/2013   HCT 33.1* 02/19/2013   MCV 87.6 02/19/2013   PLT 298 02/19/2013   Lab Results  Component Value Date   NA 133* 02/19/2013   K 4.1 02/19/2013   CL 101 02/19/2013   CO2 21 02/19/2013   Lab Results  Component Value Date   ALT 33 02/18/2013   AST 19 02/18/2013   ALKPHOS 118* 02/18/2013   BILITOT 0.6 02/18/2013     RADIOGRAPHY: Dg Chest 2 View  02/15/2013   CLINICAL DATA:  Shortness of breath and cough.  Former smoker.  EXAM: CHEST  2 VIEW  COMPARISON:  None.  FINDINGS: There is right upper lobe airspace opacity and marked fullness of the right hilum. The left lung appears clear. Heart  size is normal. No pneumothorax or pleural fluid.  IMPRESSION: Marked fullness of the right hilum and right upper lobe airspace opacity could be due to pneumonia but is worrisome for lung carcinoma. Chest CT with contrast is recommended for further evaluation.  These results will be called to the ordering clinician or representative by the Radiologist Assistant, and communication documented in the PACS Dashboard.   Electronically Signed   By: Drusilla Kanner M.D.   On: 02/15/2013 16:35   Ct Chest W Contrast  02/18/2013   CLINICAL DATA:  Abnormal chest radiograph.  Smoker  EXAM: CT CHEST WITH CONTRAST  TECHNIQUE: Multidetector CT imaging of the chest was performed during intravenous contrast administration.  CONTRAST:  75mL OMNIPAQUE IOHEXOL 300 MG/ML  SOLN  COMPARISON:  DG CHEST 2 VIEW dated 02/15/2013  FINDINGS: There is a right suprahilar mass obstructing the right upper lobe bronchus. This mass extends into the mediastinum and measures 3.7 by 3.8 cm in axial dimension (image 20) and extends 7 cm in craniocaudad dimension along the right paratracheal location. The mass encroaches upon the bronchus intermedius narrowing this bronchus (image 20). The mass abuts the  right pulmonary artery. Large subcarinal lymph node measures 30 mm. There is a large right hilar lymph node measuring 18 mm. Potentially enlarged left hilar lymph node measures 21 mm.  Review of the lung parenchyma demonstrates collapse of the right upper lobe. No additional pulmonary nodularity.  Limited view of the upper abdomen partially images the adrenal glands. No mass seen. No focal hepatic lesion. Review of the bone windows demonstrates degenerative change of the spine. No aggressive osseous lesion.  IMPRESSION: 1. 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 2. Bulky sub carinal adenopathy 3. Right hilar adenopathy a potential left hilar adenopathy. 4. Recommend FDG PET scanning for staging and oncology consultation. This was made a call report.   Electronically Signed   By: Genevive Bi M.D.   On: 02/18/2013 11:08      IMPRESSION:  Stage III-B non-small cell lung cancer (T2b, N3, Mx).  The patient would be a good candidate for a definitive course of radiation along with radiosensitizing chemotherapy, pending results on the patient's staging workup with PET scan and MRI the brain. I discussed treatment course side effects and potential toxicities of radiation therapy in this situation with the patient and several family members today. The patient appears to understand and wishes to proceed with planned course of treatment. In light of the potential for SVC obstruction as well as known bronchial obstruction and lung collapse and  the patient's associated respiratory status, she will proceed with simulation early next week and start her treatments next week.  PLAN: Simulation and planning on December 22 with treatments to begin December 23 or 24th. Anticipate approximately 7 weeks of radiation therapy unless staging workup shows distant metastasis.  I spent 60 minutes minutes face to face with the  patient and more than 50% of that time was spent in counseling and/or coordination of care.   ------------------------------------------------

## 2013-02-24 NOTE — Telephone Encounter (Signed)
Gave pt appt for labs, md and chemo class for December  and Janaury 2015 emailed Marcelino Duster regarding chemo

## 2013-02-24 NOTE — Progress Notes (Signed)
[facility]  Clinical Social Work  Clinical Social Work met with patient/family at [employer] to offer support and assess for needs. MD reviewed diagnosis and treatment plan with patient. The patient was agreeable to the plan and family asked questions regarding staging and patients ability to work.  MD recommended patient not work at this time. CSW briefly discussed CSW role at cancer center.. CSW encouraged patient/family to call with any questions or concerns and CSW will follow up as needed.   Kathrin Penner, MSW, LCSW  Clinical Social Worker  [facility]  [phone]

## 2013-02-25 ENCOUNTER — Encounter: Payer: Self-pay | Admitting: Radiation Oncology

## 2013-02-25 ENCOUNTER — Telehealth: Payer: Self-pay | Admitting: *Deleted

## 2013-02-25 LAB — CULTURE, BLOOD (ROUTINE X 2)
Culture: NO GROWTH
Culture: NO GROWTH

## 2013-02-25 NOTE — Telephone Encounter (Signed)
Per staff message and POF I have scheduled appts. No available for treatment on 12/29, moved to 12/30. Scheduler notified off change, and that lab appts need to be moved  JMW

## 2013-02-26 ENCOUNTER — Encounter: Payer: Self-pay | Admitting: Internal Medicine

## 2013-02-26 MED ORDER — PROCHLORPERAZINE MALEATE 10 MG PO TABS: 10.0000 mg | ORAL_TABLET | Freq: Four times a day (QID) | ORAL | Status: AC | PRN

## 2013-02-28 ENCOUNTER — Ambulatory Visit
Admission: RE | Admit: 2013-02-28 | Discharge: 2013-02-28 | Disposition: A | Discharge status: Home / Self Care | Payer: BC Managed Care – PPO | Source: Ambulatory Visit | Attending: Radiation Oncology | Admitting: Radiation Oncology

## 2013-02-28 DIAGNOSIS — C3490 Malignant neoplasm of unspecified part of unspecified bronchus or lung: Secondary | ICD-10-CM | POA: Insufficient documentation

## 2013-02-28 DIAGNOSIS — I2699 Other pulmonary embolism without acute cor pulmonale: Secondary | ICD-10-CM | POA: Insufficient documentation

## 2013-02-28 DIAGNOSIS — C3491 Malignant neoplasm of unspecified part of right bronchus or lung: Secondary | ICD-10-CM

## 2013-02-28 DIAGNOSIS — Z79899 Other long term (current) drug therapy: Secondary | ICD-10-CM | POA: Insufficient documentation

## 2013-02-28 DIAGNOSIS — R042 Hemoptysis: Secondary | ICD-10-CM | POA: Insufficient documentation

## 2013-02-28 DIAGNOSIS — Z510 Encounter for antineoplastic radiation therapy: Secondary | ICD-10-CM | POA: Insufficient documentation

## 2013-02-28 NOTE — Progress Notes (Signed)
  Radiation Oncology         [phone] ________________________________  Name: Meagan Garcia MRN: [id number]  Date: 02/28/2013  DOB: 1950/05/08  SIMULATION AND TREATMENT PLANNING NOTE  DIAGNOSIS:  Stage III-B non-small cell lung cancer (T2b, N3, Mx)  NARRATIVE:  The patient was brought to the CT Simulation planning suite.  Identity was confirmed.  All relevant records and images related to the planned course of therapy were reviewed.  The patient freely provided informed written consent to proceed with treatment after reviewing the details related to the planned course of therapy. The consent form was witnessed and verified by the simulation staff.  Then, the patient was set-up in a stable reproducible  supine position for radiation therapy.  CT images were obtained.  Surface markings were placed.  The CT images were loaded into the planning software.  Then the target and avoidance structures were contoured.  Treatment planning then occurred.  The radiation prescription was entered and confirmed.  Then, I designed and supervised the construction of a total of 1 medically necessary complex treatment devices.  I have requested : 3D Simulation  I have requested a DVH of the following structures: GTV, PTV, lungs, spinal cord. I have ordered dose calc.  PLAN:  The patient will receive 63 Gy in 35 fractions if lung volume dose constraints allow.  ________________________________   Special treatment procedure note  The patient will be receiving radiosensitizing chemotherapy throughout her course of treatment. Given the increased potential for toxicities as well as the necessity for close monitoring of the patient and bloodwork, this constitutes a special treatment procedure. -----------------------------------  Billie Lade, PhD, MD

## 2013-03-01 ENCOUNTER — Other Ambulatory Visit: Payer: BC Managed Care – PPO

## 2013-03-01 ENCOUNTER — Telehealth: Payer: Self-pay | Admitting: Internal Medicine

## 2013-03-01 NOTE — Telephone Encounter (Signed)
Gave pt  appt for December and january 2015

## 2013-03-01 NOTE — Telephone Encounter (Signed)
Called pt and left message regarding lab and chemo for 12/30 pm

## 2013-03-02 ENCOUNTER — Ambulatory Visit
Admission: RE | Admit: 2013-03-02 | Discharge: 2013-03-02 | Disposition: A | Discharge status: Home / Self Care | Payer: BC Managed Care – PPO | Source: Ambulatory Visit | Attending: Radiation Oncology | Admitting: Radiation Oncology

## 2013-03-02 ENCOUNTER — Telehealth: Payer: Self-pay | Admitting: Medical Oncology

## 2013-03-02 DIAGNOSIS — C3491 Malignant neoplasm of unspecified part of right bronchus or lung: Secondary | ICD-10-CM

## 2013-03-02 NOTE — Telephone Encounter (Signed)
FMLA form given to [employer].

## 2013-03-02 NOTE — Progress Notes (Signed)
  Radiation Oncology         ([phone]) [phone] ________________________________  Name: Meagan Garcia MRN: [id number]  Date: 03/02/2013  DOB: Apr 03, 1950  Simulation Verification Note  Status: outpatient  NARRATIVE: The patient was brought to the treatment unit and placed in the planned treatment position. The clinical setup was verified. Then port films were obtained and uploaded to the radiation oncology medical record software.  The treatment beams were carefully compared against the planned radiation fields. The position location and shape of the radiation fields was reviewed. They targeted volume of tissue appears to be appropriately covered by the radiation beams. Organs at risk appear to be excluded as planned.  Based on my personal review, I approved the simulation verification. The patient's treatment will proceed as planned.  -----------------------------------  Billie Lade, PhD, MD

## 2013-03-03 NOTE — Discharge Summary (Signed)
Physician Discharge Summary  Meagan Garcia EAV:[id number] DOB: 13-Jul-1950 DOA: 02/18/2013  PCP: Sonda Primes, MD  Admit date: 02/18/2013 Discharge date: 02/23/2013  Time spent: 45 minutes  Recommendations for Outpatient Follow-up:  1. Dr.Mohamed 12/18 2. FU Biopsy   Discharge Diagnoses:  Principal Problem:   Acute respiratory failure Active Problems:   Hyponatremia   Lung mass   PNA (pneumonia)   Discharge Condition:stable  Diet recommendation: regular  Filed Weights   02/18/13 1942 02/22/13 0332 02/22/13 1223  Weight: 101.152 kg (223 lb) 102.6 kg (226 lb 3.1 oz) 99.9 kg (220 lb 3.8 oz)    History of present illness:    Hospital Course:  Brief narrative:  62 yo female who presented to [facility] ED 02/18/2013 with progressively worsening shortness of breath for past 2-3 weeks prior to this admission. Shortness of breath was prominent on exertion initially but now even at rest. There was also associated wheezing, malaise, fever, cough with hemoptysis. Pt explained she was treated for bronchitis but with no significant improvement.  In ED, pt was wheezing with RR in 30's that has responded well to BD's and oxygen. CT chest was worrisome for bronchogenic carcinoma. Pt underwent bronchoscopy 02/21/2013 and findings are highly suspicious for carcinoma. Path results pending.  Assessment and Plan:  Principal Problem:  Acute respiratory failure  - most likely secondary to postobstructive pneumonia, bronchogenic carcinoma  - appreciate PCCM input; bronch done 02/21/2013, awaiting pathology results  - continue BD every 4 hours PRN shortness of breath or wheezing  - oxygen support via nasal canula to keep O2 saturation above 90%  - Patient has received azithromycin and Rocephin since admission but we will discontinue antibiotics today per pulmonary recommendations.  -Improved now  Lung mass  - management as noted above  - PCCM consulted  - oncology also following -Fu biopsy  results as outpatient  Hyponatremia  - likely dehydration  - sodium improved with IV fluids   Acute renal failure  - likely pre renal  - improved to WNL with IV fluids   Anemia  - hemoglobin dropped from 12 to 10.7 with hemodilution - no indications for transusion    Procedures:  Lung mass biopsy  Consultations:  Pulm  Onc  Discharge Exam: Filed Vitals:   02/23/13 1325  BP:   Pulse: 94  Temp:   Resp:     General: AAOx3 Cardiovascular: S1S2/RRR Respiratory: CTAB  Discharge Instructions  Discharge Orders   Future Appointments Provider Department Dept Phone   03/04/2013 12:00 PM [facility] [id number] [facility] [phone]   03/07/2013 9:25 AM [facility] [id number] [facility] [phone]   03/08/2013 10:30 AM Tresa Garter, MD [facility] [place] [phone]   03/08/2013 12:00 PM [facility] [id number] [facility] [phone]   03/08/2013 1:30 PM [facility] Lab 1 [place] [facility] [phone]   03/08/2013 2:00 PM [facility] G22 [facility] [phone]   03/09/2013 10:00 AM [facility] [phone]   Please arrive 15 minutes prior to your appointment time.   03/09/2013 11:00 AM [facility] [id number] [facility] [phone]   03/11/2013 8:45 AM Nyoka Cowden, MD [facility] [phone]   03/11/2013 9:35 AM [facility] [id number]  [facility] [phone]   03/11/2013 12:00 PM [facility] [phone]   Pt should arrive15 minutes prior to scheduled appt  time. Please inform patient that exam will take a minimum of 1 1/2 hours. Patient to be NPO 6 hours prior to exam  and should not take any insulin the day of exam.   03/14/2013 10:15 AM  [facility] Lab 1 [place] [facility] [phone]   03/14/2013 11:00 AM [facility] H30 [place] [phone]   03/14/2013 2:30 PM [facility] [id number] [place] [facility] [phone]   03/15/2013 1:40 PM [facility] [id number] [place] [facility] [phone]   03/16/2013 1:05 PM [facility] [id number] [facility] [phone]   03/17/2013 11:05 AM [facility] [id number] [facility] [phone]   03/18/2013 11:00 AM [facility] [id number] [facility] [phone]   03/21/2013 11:00 AM [facility] [id number] [place] [facility] [phone]   03/21/2013 11:15 AM [facility] Lab 4 [place] [facility] [phone]   03/21/2013 11:45 AM Conni Slipper, PA-C [facility] [phone]   03/21/2013 12:45 PM [facility] G24 [place] [facility] [phone]   03/22/2013 11:00 AM [facility] [id number] [place] [facility] [phone]   03/23/2013 11:00 AM [facility] [id number] [place] [facility] [phone]   03/24/2013 11:00 AM [facility] [id number] [place] [facility] [phone]   03/25/2013 11:00 AM [facility] [id number] [facility] [phone]   03/28/2013 10:15 AM [facility] Lab 4 [place] [facility] [phone]   03/28/2013 11:00 AM [facility] B7 [place] [facility] [phone]   03/28/2013 2:30 PM [facility] [id number] [facility] [phone]   03/29/2013 11:00 AM [facility] [id number] [place] [facility] [phone]   03/30/2013 11:00 AM [facility] [id number] [place] [facility] RAD[facility]  ONCOLOGY [phone]   03/31/2013 11:00 AM [facility] [id number] [place] [facility] [phone]   04/01/2013 11:00 AM [facility] [id number] [facility] [facility] [phone]   04/04/2013 10:45 AM [facility] Lab 4 [place] [facility] [phone]   04/04/2013 11:00 AM [facility] [id number] [place] [facility] [phone]   04/04/2013 11:30 AM [facility] D12 [place] [facility] [phone]   04/05/2013 11:00 AM [facility] [id number] [place] [facility] [phone]   04/06/2013 9:00 AM Tresa Garter, MD [facility] [phone]   04/06/2013 11:00 AM [facility] [id number] [place] [facility] [phone]   04/07/2013 11:00 AM [facility] [id number] [place] [facility] [phone]   04/08/2013 11:00 AM [facility] [id number] [place] [facility] [phone]   04/11/2013 11:00 AM [facility] [id number] [place] [facility] [phone]   04/12/2013 11:00 AM [facility] [id number] [place] [facility] [phone]   04/13/2013 11:00 AM [facility] [id number] [place] [facility] [phone]   04/14/2013 11:00 AM [facility] [id number] [place] [facility] [phone]   04/15/2013 11:00 AM [facility] [id number] [place] [facility] [phone]   04/18/2013 11:00 AM [facility] [id number] [place] [facility] [phone]   04/19/2013 11:00 AM [facility] [id number] [facility] [facility] [phone]   04/20/2013 11:00 AM [facility] [id number]  [facility] [phone]   04/21/2013 11:00 AM [facility] [id number]  [facility] [phone]   Future  Orders Complete By Expires   Diet -  low sodium heart healthy  As directed    Increase activity slowly  As directed        Medication List    STOP taking these medications       ibuprofen 200 MG tablet  Commonly known as:  ADVIL,MOTRIN     levofloxacin 500 MG tablet  Commonly known as:  LEVAQUIN      TAKE these medications       albuterol 108 (90 BASE) MCG/ACT inhaler  Commonly known as:  PROVENTIL HFA;VENTOLIN HFA  Inhale 2 puffs into the lungs every 6 (six) hours as needed for wheezing or shortness of breath.     buPROPion 150 MG 12 hr tablet  Commonly known as:  WELLBUTRIN SR  Take 150 mg by mouth daily.     carvedilol 12.5 MG tablet  Commonly known as:  COREG  Take 1 tablet (12.5 mg total) by mouth 2 (two) times daily with a meal.     chlorpheniramine-HYDROcodone 10-8 MG/5ML Lqcr  Commonly known as:  TUSSIONEX PENNKINETIC ER  Take 5 mLs by mouth every 12 (twelve) hours as needed.     cholecalciferol 1000 UNITS tablet  Commonly known as:  VITAMIN D  Take 1 tablet (1,000 Units total) by mouth daily.     fluticasone 50 MCG/ACT nasal spray  Commonly known as:  FLONASE  Place 2 sprays into the nose daily.     loratadine 10 MG tablet  Commonly known as:  CLARITIN  Take 1 tablet (10 mg total) by mouth daily.     LORazepam 1 MG tablet  Commonly known as:  ATIVAN  TAKE ONE TABLET BY MOUTH TWICE DAILY     spironolactone 25 MG tablet  Commonly known as:  ALDACTONE  Take 1 tablet (25 mg total) by mouth daily.     tiotropium 18 MCG inhalation capsule  Commonly known as:  SPIRIVA HANDIHALER  Place 1 capsule (18 mcg total) into inhaler and inhale daily.       No Known Allergies     Follow-up Information   Follow up with Sonda Primes, MD. Schedule an appointment as soon as possible for a visit in 2 weeks.   Specialty:  Internal Medicine   Contact information:   [address] [place] [address] [phone]       Follow up  with Lajuana Matte., MD On 02/24/2013.   Specialty:  Oncology   Contact information:   [address] [place] [address] [phone]       Follow up with Lonia Farber, MD. Schedule an appointment as soon as possible for a visit in 2 weeks.   Specialty:  Pulmonary Disease   Contact information:   [address] [place] [address] [phone]        The results of significant diagnostics from this hospitalization (including imaging, microbiology, ancillary and laboratory) are listed below for reference.    Significant Diagnostic Studies: Dg Chest 2 View  02/15/2013   CLINICAL DATA:  Shortness of breath and cough.  Former smoker.  EXAM: CHEST  2 VIEW  COMPARISON:  None.  FINDINGS: There is right upper lobe airspace opacity and marked fullness of the right hilum. The left lung appears clear. Heart size is normal. No pneumothorax or pleural fluid.  IMPRESSION: Marked fullness of the right hilum and right upper lobe airspace opacity could be due to pneumonia but is worrisome for lung carcinoma. Chest CT with contrast is recommended  for further evaluation.  These results will be called to the ordering clinician or representative by the Radiologist Assistant, and communication documented in the PACS Dashboard.   Electronically Signed   By: Drusilla Kanner M.D.   On: 02/15/2013 16:35   Ct Chest W Contrast  02/18/2013   CLINICAL DATA:  Abnormal chest radiograph.  Smoker  EXAM: CT CHEST WITH CONTRAST  TECHNIQUE: Multidetector CT imaging of the chest was performed during intravenous contrast administration.  CONTRAST:  75mL OMNIPAQUE IOHEXOL 300 MG/ML  SOLN  COMPARISON:  DG CHEST 2 VIEW dated 02/15/2013  FINDINGS: There is a right suprahilar mass obstructing the right upper lobe bronchus. This mass extends into the mediastinum and measures 3.7 by 3.8 cm in axial dimension (image 20) and extends 7 cm in craniocaudad dimension along the right paratracheal location. The mass  encroaches upon the bronchus intermedius narrowing this bronchus (image 20). The mass abuts the right pulmonary artery. Large subcarinal lymph node measures 30 mm. There is a large right hilar lymph node measuring 18 mm. Potentially enlarged left hilar lymph node measures 21 mm.  Review of the lung parenchyma demonstrates collapse of the right upper lobe. No additional pulmonary nodularity.  Limited view of the upper abdomen partially images the adrenal glands. No mass seen. No focal hepatic lesion. Review of the bone windows demonstrates degenerative change of the spine. No aggressive osseous lesion.  IMPRESSION: 1. 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 2. Bulky sub carinal adenopathy 3. Right hilar adenopathy a potential left hilar adenopathy. 4. Recommend FDG PET scanning for staging and oncology consultation. This was made a call report.   Electronically Signed   By: Genevive Bi M.D.   On: 02/18/2013 11:08    Microbiology: Recent Results (from the past 240 hour(s))  MRSA PCR SCREENING     Status: None   Collection Time    02/21/13  4:36 PM      Result Value Range Status   MRSA by PCR NEGATIVE  NEGATIVE Final   Comment:            The GeneXpert MRSA Assay (FDA     approved for NASAL specimens     only), is one component of a     comprehensive MRSA colonization     surveillance program. It is not     intended to diagnose MRSA     infection nor to guide or     monitor treatment for     MRSA infections.     Labs: Basic Metabolic Panel: No results found for this basename: NA, K, CL, CO2, GLUCOSE, BUN, CREATININE, CALCIUM, MG, PHOS,  in the last 168 hours Liver Function Tests: No results found for this basename: AST, ALT, ALKPHOS, BILITOT, PROT, ALBUMIN,  in the last 168 hours No results found for this basename: LIPASE, AMYLASE,  in the last 168 hours No results found for this  basename: AMMONIA,  in the last 168 hours CBC: No results found for this basename: WBC, NEUTROABS, HGB, HCT, MCV, PLT,  in the last 168 hours Cardiac Enzymes: No results found for this basename: CKTOTAL, CKMB, CKMBINDEX, TROPONINI,  in the last 168 hours BNP: BNP (last 3 results) No results found for this basename: PROBNP,  in the last 8760 hours CBG: No results found for this basename: GLUCAP,  in the last 168 hours     Signed:  Louise Victory  [facility] 03/03/2013,  1:14 PM

## 2013-03-04 ENCOUNTER — Ambulatory Visit
Admission: RE | Admit: 2013-03-04 | Discharge: 2013-03-04 | Disposition: A | Discharge status: Home / Self Care | Payer: BC Managed Care – PPO | Source: Ambulatory Visit | Attending: Radiation Oncology | Admitting: Radiation Oncology

## 2013-03-07 ENCOUNTER — Encounter (HOSPITAL_COMMUNITY): Payer: Self-pay | Admitting: Emergency Medicine

## 2013-03-07 ENCOUNTER — Other Ambulatory Visit: Payer: BC Managed Care – PPO

## 2013-03-07 ENCOUNTER — Encounter: Payer: Self-pay | Admitting: Internal Medicine

## 2013-03-07 ENCOUNTER — Other Ambulatory Visit: Payer: Self-pay | Admitting: Physician Assistant

## 2013-03-07 ENCOUNTER — Inpatient Hospital Stay (HOSPITAL_COMMUNITY)
Admission: EM | Admit: 2013-03-07 | Discharge: 2013-03-16 | DRG: 175 | Disposition: A | Discharge status: Home / Self Care | Payer: BC Managed Care – PPO | Attending: Critical Care Medicine | Admitting: Critical Care Medicine

## 2013-03-07 ENCOUNTER — Emergency Department (HOSPITAL_COMMUNITY): Payer: BC Managed Care – PPO

## 2013-03-07 ENCOUNTER — Encounter: Payer: Self-pay | Admitting: *Deleted

## 2013-03-07 ENCOUNTER — Ambulatory Visit
Admission: RE | Admit: 2013-03-07 | Discharge: 2013-03-07 | Disposition: A | Discharge status: Home / Self Care | Payer: BC Managed Care – PPO | Source: Ambulatory Visit | Attending: Radiation Oncology | Admitting: Radiation Oncology

## 2013-03-07 DIAGNOSIS — M542 Cervicalgia: Secondary | ICD-10-CM

## 2013-03-07 DIAGNOSIS — G4700 Insomnia, unspecified: Secondary | ICD-10-CM

## 2013-03-07 DIAGNOSIS — M545 Low back pain, unspecified: Secondary | ICD-10-CM

## 2013-03-07 DIAGNOSIS — R0602 Shortness of breath: Secondary | ICD-10-CM

## 2013-03-07 DIAGNOSIS — F3289 Other specified depressive episodes: Secondary | ICD-10-CM

## 2013-03-07 DIAGNOSIS — K5900 Constipation, unspecified: Secondary | ICD-10-CM | POA: Diagnosis present

## 2013-03-07 DIAGNOSIS — J189 Pneumonia, unspecified organism: Secondary | ICD-10-CM

## 2013-03-07 DIAGNOSIS — F329 Major depressive disorder, single episode, unspecified: Secondary | ICD-10-CM | POA: Diagnosis present

## 2013-03-07 DIAGNOSIS — R918 Other nonspecific abnormal finding of lung field: Secondary | ICD-10-CM

## 2013-03-07 DIAGNOSIS — F4321 Adjustment disorder with depressed mood: Secondary | ICD-10-CM

## 2013-03-07 DIAGNOSIS — J329 Chronic sinusitis, unspecified: Secondary | ICD-10-CM

## 2013-03-07 DIAGNOSIS — R9389 Abnormal findings on diagnostic imaging of other specified body structures: Secondary | ICD-10-CM

## 2013-03-07 DIAGNOSIS — R7989 Other specified abnormal findings of blood chemistry: Secondary | ICD-10-CM

## 2013-03-07 DIAGNOSIS — J9600 Acute respiratory failure, unspecified whether with hypoxia or hypercapnia: Secondary | ICD-10-CM

## 2013-03-07 DIAGNOSIS — D485 Neoplasm of uncertain behavior of skin: Secondary | ICD-10-CM

## 2013-03-07 DIAGNOSIS — R609 Edema, unspecified: Secondary | ICD-10-CM

## 2013-03-07 DIAGNOSIS — C3490 Malignant neoplasm of unspecified part of unspecified bronchus or lung: Secondary | ICD-10-CM

## 2013-03-07 DIAGNOSIS — J9811 Atelectasis: Secondary | ICD-10-CM

## 2013-03-07 DIAGNOSIS — K219 Gastro-esophageal reflux disease without esophagitis: Secondary | ICD-10-CM

## 2013-03-07 DIAGNOSIS — R059 Cough, unspecified: Secondary | ICD-10-CM

## 2013-03-07 DIAGNOSIS — M25561 Pain in right knee: Secondary | ICD-10-CM

## 2013-03-07 DIAGNOSIS — J9819 Other pulmonary collapse: Secondary | ICD-10-CM

## 2013-03-07 DIAGNOSIS — G609 Hereditary and idiopathic neuropathy, unspecified: Secondary | ICD-10-CM

## 2013-03-07 DIAGNOSIS — J449 Chronic obstructive pulmonary disease, unspecified: Secondary | ICD-10-CM | POA: Diagnosis present

## 2013-03-07 DIAGNOSIS — J4489 Other specified chronic obstructive pulmonary disease: Secondary | ICD-10-CM | POA: Diagnosis present

## 2013-03-07 DIAGNOSIS — C3491 Malignant neoplasm of unspecified part of right bronchus or lung: Secondary | ICD-10-CM

## 2013-03-07 DIAGNOSIS — H6593 Unspecified nonsuppurative otitis media, bilateral: Secondary | ICD-10-CM

## 2013-03-07 DIAGNOSIS — Z87891 Personal history of nicotine dependence: Secondary | ICD-10-CM

## 2013-03-07 DIAGNOSIS — Z801 Family history of malignant neoplasm of trachea, bronchus and lung: Secondary | ICD-10-CM

## 2013-03-07 DIAGNOSIS — J309 Allergic rhinitis, unspecified: Secondary | ICD-10-CM

## 2013-03-07 DIAGNOSIS — IMO0002 Reserved for concepts with insufficient information to code with codable children: Secondary | ICD-10-CM | POA: Diagnosis present

## 2013-03-07 DIAGNOSIS — F4320 Adjustment disorder, unspecified: Secondary | ICD-10-CM

## 2013-03-07 DIAGNOSIS — E871 Hypo-osmolality and hyponatremia: Secondary | ICD-10-CM

## 2013-03-07 DIAGNOSIS — F411 Generalized anxiety disorder: Secondary | ICD-10-CM

## 2013-03-07 DIAGNOSIS — I10 Essential (primary) hypertension: Secondary | ICD-10-CM

## 2013-03-07 DIAGNOSIS — R05 Cough: Secondary | ICD-10-CM

## 2013-03-07 DIAGNOSIS — B354 Tinea corporis: Secondary | ICD-10-CM

## 2013-03-07 DIAGNOSIS — E669 Obesity, unspecified: Secondary | ICD-10-CM

## 2013-03-07 DIAGNOSIS — T17408A Unspecified foreign body in trachea causing other injury, initial encounter: Secondary | ICD-10-CM | POA: Diagnosis present

## 2013-03-07 DIAGNOSIS — I2699 Other pulmonary embolism without acute cor pulmonale: Principal | ICD-10-CM

## 2013-03-07 DIAGNOSIS — J45909 Unspecified asthma, uncomplicated: Secondary | ICD-10-CM

## 2013-03-07 DIAGNOSIS — L259 Unspecified contact dermatitis, unspecified cause: Secondary | ICD-10-CM

## 2013-03-07 DIAGNOSIS — Z79899 Other long term (current) drug therapy: Secondary | ICD-10-CM

## 2013-03-07 HISTORY — DX: Malignant neoplasm of unspecified part of unspecified bronchus or lung: C34.90

## 2013-03-07 LAB — COMPREHENSIVE METABOLIC PANEL
ALT: 89 U/L — ABNORMAL HIGH (ref 0–35)
AST: 45 U/L — ABNORMAL HIGH (ref 0–37)
Alkaline Phosphatase: 150 U/L — ABNORMAL HIGH (ref 39–117)
BUN: 8 mg/dL (ref 6–23)
CO2: 22 mEq/L (ref 19–32)
Calcium: 10.1 mg/dL (ref 8.4–10.5)
Chloride: 95 mEq/L — ABNORMAL LOW (ref 96–112)
Creatinine, Ser: 0.88 mg/dL (ref 0.50–1.10)
GFR calc Af Amer: 81 mL/min — ABNORMAL LOW (ref >90)
GFR calc non Af Amer: 69 mL/min — ABNORMAL LOW (ref >90)
Glucose, Bld: 108 mg/dL — ABNORMAL HIGH (ref 70–99)
Potassium: 4.5 mEq/L (ref 3.5–5.1)
Total Bilirubin: 0.8 mg/dL (ref 0.3–1.2)

## 2013-03-07 LAB — CBC WITH DIFFERENTIAL/PLATELET
Basophils Absolute: 0 10*3/uL (ref 0.0–0.1)
Basophils Relative: 0 % (ref 0–1)
Eosinophils Relative: 1 % (ref 0–5)
HCT: 41.8 % (ref 36.0–46.0)
Hemoglobin: 13.7 g/dL (ref 12.0–15.0)
Lymphocytes Relative: 13 % (ref 12–46)
MCHC: 32.8 g/dL (ref 30.0–36.0)
MCV: 85 fL (ref 78.0–100.0)
Monocytes Absolute: 1 10*3/uL (ref 0.1–1.0)
Monocytes Relative: 8 % (ref 3–12)
Platelets: 242 10*3/uL (ref 150–400)
RDW: 13.9 % (ref 11.5–15.5)
WBC: 12.5 10*3/uL — ABNORMAL HIGH (ref 4.0–10.5)

## 2013-03-07 LAB — BLOOD GAS, ARTERIAL
Acid-base deficit: 1.5 mmol/L (ref 0.0–2.0)
Bicarbonate: 20.7 mEq/L (ref 20.0–24.0)
Drawn by: 307971
O2 Saturation: 90.2 %
Patient temperature: 98.6
TCO2: 18.1 mmol/L (ref 0–100)
pCO2 arterial: 29.2 mmHg — ABNORMAL LOW (ref 35.0–45.0)
pO2, Arterial: 59.7 mmHg — ABNORMAL LOW (ref 80.0–100.0)

## 2013-03-07 LAB — POCT I-STAT TROPONIN I: Troponin i, poc: 0.16 ng/mL (ref 0.00–0.08)

## 2013-03-07 MED ORDER — HEPARIN BOLUS VIA INFUSION
1500.0000 [IU] | INTRAVENOUS | Status: AC
  Administered 2013-03-07: 1500 [IU] via INTRAVENOUS
  Filled 2013-03-07: qty 1500

## 2013-03-07 MED ORDER — HEPARIN (PORCINE) IN NACL 100-0.45 UNIT/ML-% IJ SOLN
1450.0000 [IU]/h | INTRAMUSCULAR | Status: DC
  Filled 2013-03-07: qty 250

## 2013-03-07 MED ORDER — HEPARIN BOLUS VIA INFUSION
4000.0000 [IU] | INTRAVENOUS | Status: AC
  Administered 2013-03-07: 4000 [IU] via INTRAVENOUS
  Filled 2013-03-07: qty 4000

## 2013-03-07 MED ORDER — HEPARIN (PORCINE) IN NACL 100-0.45 UNIT/ML-% IJ SOLN
1000.0000 [IU]/h | INTRAMUSCULAR | Status: DC
  Administered 2013-03-07: 1000 [IU]/h via INTRAVENOUS
  Filled 2013-03-07: qty 250

## 2013-03-07 MED ORDER — ALBUTEROL (5 MG/ML) CONTINUOUS INHALATION SOLN
15.0000 mg/h | INHALATION_SOLUTION | Freq: Once | RESPIRATORY_TRACT | Status: DC
  Filled 2013-03-07: qty 20

## 2013-03-07 MED ORDER — IPRATROPIUM-ALBUTEROL 0.5-2.5 (3) MG/3ML IN SOLN
3.0000 mL | Freq: Four times a day (QID) | RESPIRATORY_TRACT | Status: DC
  Administered 2013-03-07 – 2013-03-12 (×14): 3 mL via RESPIRATORY_TRACT
  Filled 2013-03-07 (×16): qty 3

## 2013-03-07 MED ORDER — ASPIRIN 325 MG PO TABS
325.0000 mg | ORAL_TABLET | Freq: Every day | ORAL | Status: DC
  Administered 2013-03-07 – 2013-03-12 (×6): 325 mg via ORAL
  Filled 2013-03-07 (×6): qty 1

## 2013-03-07 MED ORDER — IOHEXOL 350 MG/ML SOLN
100.0000 mL | Freq: Once | INTRAVENOUS | Status: AC | PRN
  Administered 2013-03-07: 100 mL via INTRAVENOUS

## 2013-03-07 MED ORDER — SODIUM CHLORIDE 0.9 % IV SOLN
250.0000 mL | INTRAVENOUS | Status: DC | PRN
  Administered 2013-03-10: 250 mL via INTRAVENOUS

## 2013-03-07 NOTE — ED Notes (Signed)
Positive I stat Trop -  PA Misty Stanley and RN aware.

## 2013-03-07 NOTE — H&P (Signed)
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 Started on RT ([facility]) with goal of starting chemo 12/30 (mohammed) presented with worsening sob & hypoxic resp failure, CT scan showing progressive RML/RLL atelectasis & multiple left-sided  segmental and subsegmental PE   SIGNIFICANT EVENTS / STUDIES:  CT chest 12/29 >>pulmonary embolism involving  multiple segmental and subsegmental arteries of all lobes of the  left lung. Overall, the clot burden is small, no strain worsening metastatic bronchogenic  carcinoma with suspected minimal increase in right suprahilar mass  and associated mediastinal adenopathy, now with near complete  obstruction of the right middle and lower lobes. Worsening interlobular septal thickening within the residual aerated right upper lobe is nonspecific though could be indicative of lymphangitic spread of tumor. small right-sided pleural effusion.  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 Started on RT ([facility]) with goal of starting chemo 12/30 (mohammed) presented with worsening sob & hypoxic resp failure, CT scan showing progressive RML/RLL atelectasis & multiple left-sided  segmental and subsegmental PE She  completed 2 RT treatments under dr Roselind Messier, able to lie supine denies CP, c/o dyspnea x 1 wk Pt c/o SOB with exertion, had radiation today, O2 saturations 78 on 3 liters of oxygen. she is not on oxygen at home   PAST MEDICAL HISTORY :  Past Medical History  Diagnosis Date  . Anxiety   . Depression   . Peripheral neuropathy     feet: unknown etiol  . Elevated glucose   . Allergic rhinitis   . GERD (gastroesophageal reflux disease)   . Complication of anesthesia w/tubal    got anest. "went crazy"    Past Surgical History  Procedure Laterality Date  . Abdominal hysterectomy      partial  . Tubal reversal    . Tubal ligation      x2  . Fibroid tumors    . Knee arthroscopy    . Foot surgery      cysts from both feet  . Video bronchoscopy Bilateral 02/21/2013    Procedure: VIDEO BRONCHOSCOPY WITHOUT FLUORO;  Surgeon: Lonia Farber, MD;  Location: [facility];  Service: Cardiopulmonary;  Laterality: Bilateral;   Prior to Admission medications   Medication Sig Start Date End Date Taking? Authorizing Provider  albuterol (PROVENTIL HFA;VENTOLIN HFA) 108 (90 BASE) MCG/ACT inhaler Inhale 2 puffs into the lungs every 6 (six) hours as needed for wheezing or shortness of breath. 02/23/13  Yes Zannie Cove, MD  buPROPion [facility] SR) 150 MG 12 hr tablet Take 150 mg by mouth daily. 04/14/12  Yes Tresa Garter, MD  carvedilol (COREG) 12.5 MG tablet Take 1 tablet (12.5 mg total) by mouth 2 (two) times daily with a meal. 04/14/12  Yes Tresa Garter, MD  chlorpheniramine-HYDROcodone (TUSSIONEX PENNKINETIC ER) 10-8  MG/5ML LQCR Take 5 mLs by mouth every 12 (twelve) hours as needed. 02/15/13  Yes Tresa Garter, MD  cholecalciferol (VITAMIN D) 1000 UNITS tablet Take 1 tablet (1,000 Units total) by mouth daily. 02/23/13 05/25/15 Yes Zannie Cove, MD  fluticasone (FLONASE) 50 MCG/ACT nasal spray Place 2 sprays into the nose daily. 04/14/12  Yes Georgina Quint Plotnikov, MD    LORazepam (ATIVAN) 1 MG tablet TAKE ONE TABLET BY MOUTH TWICE DAILY 12/23/12  Yes Tresa Garter, MD  prochlorperazine (COMPAZINE) 10 MG tablet Take 1 tablet (10 mg total) by mouth every 6 (six) hours as needed for nausea or vomiting. 02/26/13  Yes Si Gaul, MD  spironolactone (ALDACTONE) 25 MG tablet Take 1 tablet (25 mg total) by mouth daily. 04/14/12 05/04/13 Yes Georgina Quint Plotnikov, MD  tiotropium (SPIRIVA HANDIHALER) 18 MCG inhalation capsule Place 1 capsule (18 mcg total) into inhaler and inhale daily. 02/23/13  Yes Zannie Cove, MD  loratadine (CLARITIN) 10 MG tablet Take 1 tablet (10 mg total) by mouth daily. 08/18/11 08/17/12  Tresa Garter, MD   No Known Allergies  FAMILY HISTORY:  Family History  Problem Relation Age of Onset  . Hypertension Other   . Hypertension Mother   . Cancer Mother 46    lung ca  . Cancer Father    SOCIAL HISTORY:  reports that she quit smoking about 17 years ago. She has never used smokeless tobacco. She reports that she does not drink alcohol or use illicit drugs.  REVIEW OF SYSTEMS:  Constitutional: negative for anorexia, fevers and sweats  Eyes: negative for irritation, redness and visual disturbance  Ears, nose, mouth, throat, and face: negative for earaches, epistaxis, nasal congestion and sore throat  Respiratory: negative for cough, dyspnea on exertion, sputum and wheezing  Cardiovascular: negative for chest pain,  lower extremity edema, orthopnea, palpitations and syncope  Gastrointestinal: negative for abdominal pain, constipation, diarrhea, melena, nausea and vomiting  Genitourinary:negative for dysuria, frequency and hematuria  Hematologic/lymphatic: negative for bleeding, easy bruising and lymphadenopathy  Musculoskeletal:negative for arthralgias, muscle weakness and stiff joints  Neurological: negative for coordination problems, gait problems, headaches and weakness  Endocrine: negative for diabetic symptoms including  polydipsia, polyuria and weight loss   SUBJECTIVE:   VITAL SIGNS: Pulse Rate:  [99-109] 99 (12/29 1223) Resp:  [18-24] 24 (12/29 1223) BP: (135-136)/(75-82) 135/82 mmHg (12/29 1223) SpO2:  [72 %-96 %] 96 % (12/29 1223) HEMODYNAMICS:   VENTILATOR SETTINGS:   INTAKE / OUTPUT: Intake/Output   None     PHYSICAL EXAMINATION: Gen. Pleasant, well-nourished, in mild resp distress, anxious affect ENT - no lesions, no post nasal drip Neck: No JVD, no thyromegaly, no carotid bruits Lungs: no use of accessory muscles,  dullness to percussion on right, decreased on rt without rales or rhonchi  Cardiovascular: Rhythm regular, heart sounds  normal, no murmurs, no peripheral edema Abdomen: soft and non-tender, no hepatosplenomegaly, BS normal. Musculoskeletal: No deformities, no cyanosis or clubbing Neuro:  alert, non focal Skin:  Warm, no lesions/ rash   LABS:  CBC  Recent Labs Lab 03/07/13 1210  WBC 12.5*  HGB 13.7  HCT 41.8  PLT 242   Coag's No results found for this basename: APTT, INR,  in the last 168 hours BMET  Recent Labs Lab 03/07/13 1210  NA 130*  K 4.5  CL 95*  CO2 22  BUN 8  CREATININE 0.88  GLUCOSE 108*   Electrolytes  Recent Labs Lab 03/07/13 1210  CALCIUM 10.1  Sepsis Markers No results found for this basename: LATICACIDVEN, PROCALCITON, O2SATVEN,  in the last 168 hours ABG  Recent Labs Lab 03/07/13 1445  PHART 7.465*  PCO2ART 29.2*  PO2ART 59.7*   Liver Enzymes  Recent Labs Lab 03/07/13 1210  AST 45*  ALT 89*  ALKPHOS 150*  BILITOT 0.8  ALBUMIN 3.1*   Cardiac Enzymes  Recent Labs Lab 03/07/13 1210  TROPONINI 0.35*  PROBNP 933.4*   Glucose No results found for this basename: GLUCAP,  in the last 168 hours  Imaging Ct Angio Chest Pe W/cm &/or Wo Cm  03/07/2013   CLINICAL DATA:  Newly diagnosed right-sided lung cancer post initiation of chemotherapy and radiation therapy, now with worsening dyspnea upon exertion -  evaluate for pulmonary embolism  EXAM: CT ANGIOGRAPHY CHEST WITH CONTRAST  TECHNIQUE: Multidetector CT imaging of the chest was performed using the standard protocol during bolus administration of intravenous contrast. Multiplanar CT image reconstructions including MIPs were obtained to evaluate the vascular anatomy.  CONTRAST:  OMNIPAQUE IOHEXOL 350 MG/ML SOLN  COMPARISON:  Chest CT -02/18/2013  FINDINGS: Vascular Findings:  There is suboptimal opacification of the pulmonary arterial system with the main pulmonary artery measuring 214 Hounsfield units. There are nonocclusive filling defects within multiple left-sided segmental and subsegmental pulmonary arteries (representative left upper lobe - images 69 and 91; lingula - image 115; left lower lobe - images 119 and 136). There is malignant obstruction of the right upper lobe pulmonary artery (image 111, series 6). Otherwise, there are no discrete filling defects within the right-sided pulmonary arterial tree. The overall clot burden is deemed small in volume. Normal caliber of the main pulmonary artery. There is no CT evidence of right-sided heart strain. There is no reflux of contrast into the hepatic venous system.  Normal heart size. No pericardial effusion. A normal caliber of the thoracic aorta. Bovine configuration of the aortic arch. The imaged branches of the aortic arch are widely patent throughout their imaged course. No definite thoracic aortic dissection or periaortic stranding.  Review of the MIP images confirms the above findings.   ----------------------------------------------------------------------------------  Nonvascular Findings:  No definitive evidence of pulmonary infarction.  The known right suprahilar mass is grossly unchanged to minimally increased in size in the interval, currently measuring approximately 5.0 x 6.2 cm (as measured Ing greatest oblique axial dimension (image 31, series 7), previously, 4.4 x 6.8 cm and, and currently  approximately 8.1 cm in greatest craniocaudal dimension (coronal image 56, series 8), previously, 7.8 cm,  though note, exact measurements are difficult secondary to the irregular shape of this mass. This right super hilar mass now results in complete obstruction of the right mainstem bronchus with near complete atelectasis of the right middle and lower lobes with partial atelectasis of the remaining right upper lobe. There is worsening interlobular septal thickening within the aerated right upper lobe. Interval development of a small right-sided pleural effusion.  There is worsening mediastinal lymphadenopathy with index prevascular lymph node now measuring approximately 1.7 cm in greatest oblique axial dimension (image 27, series 4, previously, a 0.8 cm. Worsening left infrahilar lymphadenopathy with index nodal conglomeration measuring approximate 1.7 cm (image 37, series 4, previously, 1.1 cm. Nodal adenopathy continues to extend and surrounded the right lower lobe bronchus (image 40, series 4, grossly unchanged.  Worsening interlobular septal thickening within the left lung, most conspicuous within the bilateral lung bases.  Limited early arterial phase evaluation of the upper abdomen is normal.  There is a persistent  slightly mottled appearance of the thoracic spine, likely secondary to patient's obesity. No discrete aggressive osseous abnormalities. Normal appearance of the thyroid gland.  IMPRESSION: 1. The examination is positive for pulmonary embolism involving multiple segmental and subsegmental arteries of all lobes of the left lung. Overall, the clot burden is deemed small. No definitive CT evidence of right-sided heart strain or pulmonary infarction. 2. Overall findings suggestive of worsening metastatic bronchogenic carcinoma with suspected minimal increase in right suprahilar mass and associated mediastinal adenopathy, now with near complete obstruction of the right middle and lower lobes. Worsening  interlobular septal thickening within the residual aerated right upper lobe is nonspecific though could be indicative of lymphangitic spread of tumor. Continued attention on follow-up is recommended. 3. Interval development of small right-sided pleural effusion. 4. Worsening interlobular septal thickening within the contralateral left lung, nonspecific though could be indicative of pulmonary edema, though again, lymphangitic spread of tumor is not excluded. Critical Value/emergent results were called by telephone at the time of interpretation on 03/07/2013 at 2:26 PM to Dr. Sharilyn Sites , who verbally acknowledged these results.   Electronically Signed   By: Simonne Come M.D.   On: 03/07/2013 14:32   Dg Chest Port 1 View  03/07/2013   CLINICAL DATA:  Shortness of breath and difficulty breathing  EXAM: PORTABLE CHEST - 1 VIEW  COMPARISON:  02/18/2013  FINDINGS: There is again noted fullness in the hilum and right suprahilar at/ peritracheal region consistent with the given history of centrally obstructing mass lesion. Increasing consolidation is noted in the right lower lobe. The left lung is clear.  IMPRESSION: Persistent changes consistent with the given clinical history. There is increasing right basilar consolidation identified likely related to post obstructive changes.   Electronically Signed   By: Alcide Clever M.D.   On: 03/07/2013 12:41     CXR: increased RLL atx/infx  ASSESSMENT / PLAN:  PULMONARY A: Acute hypoxic resp failure RMl/RLL atlectasis due to  Progressive stage IIIB (T2b., N3, M0) non-small cell lung cancer causing endobronchial obstruction, CT s/o lymphangitic spread and Acute PE -no RV strain on CT but pos trop ? Underlying COPd, remote smoker P:   O2 -high flow, surprisingly tolerating hypoxia very well , no wob Iv heparin Duplex BLEs  CARDIOVASCULAR A: Htn P:  Hold BP meds Cycle trop -poor prognostic sign, although clot burden small  RENAL A:  At risk AKI  -contrast P:   hydrate  GASTROINTESTINAL A:  No issues P:   Keep npo next 24h until hypoxia improved  HEMATOLOGIC A:  No issues P:  Monitor hb,plts on heparin  INFECTIOUS A:  RLL post obs pna P:   Cefepime/vanc empiric  ENDOCRINE A:  No issues   P:   CBgs while npo  NEUROLOGIC A:  No issues P:   MRI was planned to complete staging, defer for now  TODAY'S SUMMARY: Hypoxia due to rt atx & left PE -not a candidate for lysis, discussed advance directives with pt & husband & they will ponder. Doubt she can receive chemotherapy when so hypoxic but can possibly get RT, she was able to lie supine  I have personally obtained a history, examined the patient, evaluated laboratory and imaging results, formulated the assessment and plan and placed orders. CRITICAL CARE: The patient is critically ill with multiple organ systems failure and requires high complexity decision making for assessment and support, frequent evaluation and titration of therapies, application of advanced monitoring technologies and extensive interpretation of multiple  databases. Critical Care Time devoted to patient care services described in this note is 60 minutes.   Oretha Milch  Pulmonary and Critical Care Medicine [facility] Pager: [phone]  03/07/2013, 4:35 PM

## 2013-03-07 NOTE — Progress Notes (Signed)
ANTICOAGULATION CONSULT NOTE - Initial Consult  Pharmacy Consult for Heparin Indication: chest pain/ACS  No Known Allergies  Patient Measurements:   Heparin Dosing Weight: 79 kg (adjusted weight) Weight: 98.1 kg as of 02/24/13 Height: 165 cm as of 02/24/13  Vital Signs: BP: 135/82 mmHg (12/29 1223) Pulse Rate: 99 (12/29 1223)  Labs:  Recent Labs  03/07/13 1210  HGB 13.7  HCT 41.8  PLT 242  CREATININE 0.88  TROPONINI 0.35*    The CrCl is unknown because both a height and weight (above a minimum accepted value) are required for this calculation.   Medical History: Past Medical History  Diagnosis Date  . Anxiety   . Depression   . Peripheral neuropathy     feet: unknown etiol  . Elevated glucose   . Allergic rhinitis   . GERD (gastroesophageal reflux disease)   . Complication of anesthesia w/tubal    got anest. "went crazy"      Assessment: 49 yoF with PMH significant for newly diagnosed right bronchogenic mass (diagnosed 02/18/13 by CT scan) presents with SOB with exertion s/p radiation treatment today.  STAT troponin elevated.  IV heparin ordered.  CBC WNL  02/20/13 anticoagulation labs available: APTT 29 seconds, INR 1.12  SCr 0.88, CrCl~100 ml/min  Goal of Therapy:  Heparin level 0.3-0.7 units/ml Monitor platelets by anticoagulation protocol: Yes   Plan:  1.  Heparin 4000 unit IV bolus x 1 then start infusion at 1000 units/hr (10 mL/hr). 2.  Check heparin level 6 hours following start of heparin. 3.  Daily HL and CBC while on heparin.  Clance Boll 03/07/2013,1:37 PM

## 2013-03-07 NOTE — Progress Notes (Signed)
Put daughter's fmla form on nurse's desk. °

## 2013-03-07 NOTE — ED Provider Notes (Signed)
CSN: [id number]     Arrival date & time 03/07/13  1140 History   First MD Initiated Contact with Patient 03/07/13 1151     Chief Complaint  Patient presents with  . Shortness of Breath   (Consider location/radiation/quality/duration/timing/severity/associated sxs/prior Treatment) The history is provided by the patient and medical records.   This is a 62 year old female with past medical history significant for anxiety, depression, GERD, newly diagnosed right bronchogenic mass presenting to the ED for shortness of breath.  Patient's lung mass was diagnosed on 02/18/2013 by CT scan, she was admitted to the hospital at that time for persistent shortness of breath.  Pt is being followed by oncology, Dr. Shirline Frees. Patient states she has been persistently SOB throughout the morning today but was able to complete her radiation treatment.  Pt has not been placed on home O2 at this time because she has been doing well.  Denies any current chest pain, palpitations, dizziness, weakness, or lightheadedness.  Pt hypoxic on arrival, O2 sats 72%.  Past Medical History  Diagnosis Date  . Anxiety   . Depression   . Peripheral neuropathy     feet: unknown etiol  . Elevated glucose   . Allergic rhinitis   . GERD (gastroesophageal reflux disease)   . Complication of anesthesia w/tubal    got anest. "went crazy"    Past Surgical History  Procedure Laterality Date  . Abdominal hysterectomy      partial  . Tubal reversal    . Tubal ligation      x2  . Fibroid tumors    . Knee arthroscopy    . Foot surgery      cysts from both feet  . Video bronchoscopy Bilateral 02/21/2013    Procedure: VIDEO BRONCHOSCOPY WITHOUT FLUORO;  Surgeon: Lonia Farber, MD;  Location: [facility];  Service: Cardiopulmonary;  Laterality: Bilateral;   Family History  Problem Relation Age of Onset  . Hypertension Other   . Hypertension Mother   . Cancer Mother 56    lung ca  . Cancer Father    History   Substance Use Topics  . Smoking status: Former Smoker -- 40 years    Quit date: 02/19/1996  . Smokeless tobacco: Never Used  . Alcohol Use: No   OB History   Grav Para Term Preterm Abortions TAB SAB Ect Mult Living                 Review of Systems  Respiratory: Positive for cough and shortness of breath.   All other systems reviewed and are negative.    Allergies  Review of patient's allergies indicates no known allergies.  Home Medications   Current Outpatient Rx  Name  Route  Sig  Dispense  Refill  . albuterol (PROVENTIL HFA;VENTOLIN HFA) 108 (90 BASE) MCG/ACT inhaler   Inhalation   Inhale 2 puffs into the lungs every 6 (six) hours as needed for wheezing or shortness of breath.   1 Inhaler   2   . buPROPion (WELLBUTRIN SR) 150 MG 12 hr tablet   Oral   Take 150 mg by mouth daily.         . carvedilol (COREG) 12.5 MG tablet   Oral   Take 1 tablet (12.5 mg total) by mouth 2 (two) times daily with a meal.   180 tablet   3   . chlorpheniramine-HYDROcodone (TUSSIONEX PENNKINETIC ER) 10-8 MG/5ML LQCR   Oral   Take 5 mLs by mouth every 12 (twelve)  hours as needed.   115 mL   0   . cholecalciferol (VITAMIN D) 1000 UNITS tablet   Oral   Take 1 tablet (1,000 Units total) by mouth daily.   30 tablet   0   . fluticasone (FLONASE) 50 MCG/ACT nasal spray   Nasal   Place 2 sprays into the nose daily.   48 g   3   . EXPIRED: loratadine (CLARITIN) 10 MG tablet   Oral   Take 1 tablet (10 mg total) by mouth daily.   100 tablet   3   . LORazepam (ATIVAN) 1 MG tablet      TAKE ONE TABLET BY MOUTH TWICE DAILY   180 tablet   0   . prochlorperazine (COMPAZINE) 10 MG tablet   Oral   Take 1 tablet (10 mg total) by mouth every 6 (six) hours as needed for nausea or vomiting.   60 tablet   0   . spironolactone (ALDACTONE) 25 MG tablet   Oral   Take 1 tablet (25 mg total) by mouth daily.   90 tablet   3   . tiotropium (SPIRIVA HANDIHALER) 18 MCG inhalation  capsule   Inhalation   Place 1 capsule (18 mcg total) into inhaler and inhale daily.   30 capsule   0    BP 136/75  Pulse 102  Resp 20  SpO2 95%  Physical Exam  Nursing note and vitals reviewed. Constitutional: She is oriented to person, place, and time. She appears well-developed and well-nourished. No distress.  HENT:  Head: Normocephalic and atraumatic.  Right Ear: Tympanic membrane and ear canal normal.  Left Ear: Tympanic membrane and ear canal normal.  Nose: Nose normal.  Mouth/Throat: Uvula is midline, oropharynx is clear and moist and mucous membranes are normal.  Eyes: Conjunctivae and EOM are normal. Pupils are equal, round, and reactive to light.  Neck: Normal range of motion. Neck supple.  Cardiovascular: Normal rate, regular rhythm and normal heart sounds.   Pulmonary/Chest: Not tachypneic. No respiratory distress. She has no wheezes. She has no rhonchi.  Mildly increased work of breathing without accessory muscle use or retractions; decreased breath sounds in right lung fields diffusely; no audible rhonchi or wheezes  Abdominal: Soft. Bowel sounds are normal. There is no tenderness. There is no guarding.  Musculoskeletal: Normal range of motion. She exhibits no edema.  Neurological: She is alert and oriented to person, place, and time.  Skin: Skin is warm and dry. She is not diaphoretic.  Psychiatric: She has a normal mood and affect.    ED Course  Procedures (including critical care time)   Date: 03/07/2013  Rate: 103  Rhythm: sinus tachycardia  QRS Axis: normal  Intervals: normal  ST/T Wave abnormalities: nonspecific ST/T changes  Conduction Disutrbances:none  Narrative Interpretation:   Old EKG Reviewed: none available   Labs Review Labs Reviewed  CBC WITH DIFFERENTIAL - Abnormal; Notable for the following:    WBC 12.5 (*)    Neutrophils Relative % 78 (*)    Neutro Abs 9.7 (*)    All other components within normal limits  COMPREHENSIVE METABOLIC  PANEL - Abnormal; Notable for the following:    Sodium 130 (*)    Chloride 95 (*)    Glucose, Bld 108 (*)    Albumin 3.1 (*)    AST 45 (*)    ALT 89 (*)    Alkaline Phosphatase 150 (*)    GFR calc non Af Amer 69 (*)  GFR calc Af Amer 81 (*)    All other components within normal limits  TROPONIN I - Abnormal; Notable for the following:    Troponin I 0.35 (*)    All other components within normal limits  POCT I-STAT TROPONIN I - Abnormal; Notable for the following:    Troponin i, poc 0.16 (*)    All other components within normal limits   Imaging Review Ct Angio Chest Pe W/cm &/or Wo Cm  03/07/2013   CLINICAL DATA:  Newly diagnosed right-sided lung cancer post initiation of chemotherapy and radiation therapy, now with worsening dyspnea upon exertion - evaluate for pulmonary embolism  EXAM: CT ANGIOGRAPHY CHEST WITH CONTRAST  TECHNIQUE: Multidetector CT imaging of the chest was performed using the standard protocol during bolus administration of intravenous contrast. Multiplanar CT image reconstructions including MIPs were obtained to evaluate the vascular anatomy.  CONTRAST:  OMNIPAQUE IOHEXOL 350 MG/ML SOLN  COMPARISON:  Chest CT -02/18/2013  FINDINGS: Vascular Findings:  There is suboptimal opacification of the pulmonary arterial system with the main pulmonary artery measuring 214 Hounsfield units. There are nonocclusive filling defects within multiple left-sided segmental and subsegmental pulmonary arteries (representative left upper lobe - images 69 and 91; lingula - image 115; left lower lobe - images 119 and 136). There is malignant obstruction of the right upper lobe pulmonary artery (image 111, series 6). Otherwise, there are no discrete filling defects within the right-sided pulmonary arterial tree. The overall clot burden is deemed small in volume. Normal caliber of the main pulmonary artery. There is no CT evidence of right-sided heart strain. There is no reflux of contrast into  the hepatic venous system.  Normal heart size. No pericardial effusion. A normal caliber of the thoracic aorta. Bovine configuration of the aortic arch. The imaged branches of the aortic arch are widely patent throughout their imaged course. No definite thoracic aortic dissection or periaortic stranding.  Review of the MIP images confirms the above findings.   ----------------------------------------------------------------------------------  Nonvascular Findings:  No definitive evidence of pulmonary infarction.  The known right suprahilar mass is grossly unchanged to minimally increased in size in the interval, currently measuring approximately 5.0 x 6.2 cm (as measured Ing greatest oblique axial dimension (image 31, series 7), previously, 4.4 x 6.8 cm and, and currently approximately 8.1 cm in greatest craniocaudal dimension (coronal image 56, series 8), previously, 7.8 cm,  though note, exact measurements are difficult secondary to the irregular shape of this mass. This right super hilar mass now results in complete obstruction of the right mainstem bronchus with near complete atelectasis of the right middle and lower lobes with partial atelectasis of the remaining right upper lobe. There is worsening interlobular septal thickening within the aerated right upper lobe. Interval development of a small right-sided pleural effusion.  There is worsening mediastinal lymphadenopathy with index prevascular lymph node now measuring approximately 1.7 cm in greatest oblique axial dimension (image 27, series 4, previously, a 0.8 cm. Worsening left infrahilar lymphadenopathy with index nodal conglomeration measuring approximate 1.7 cm (image 37, series 4, previously, 1.1 cm. Nodal adenopathy continues to extend and surrounded the right lower lobe bronchus (image 40, series 4, grossly unchanged.  Worsening interlobular septal thickening within the left lung, most conspicuous within the bilateral lung bases.  Limited early  arterial phase evaluation of the upper abdomen is normal.  There is a persistent slightly mottled appearance of the thoracic spine, likely secondary to patient's obesity. No discrete aggressive osseous abnormalities. Normal appearance of  the thyroid gland.  IMPRESSION: 1. The examination is positive for pulmonary embolism involving multiple segmental and subsegmental arteries of all lobes of the left lung. Overall, the clot burden is deemed small. No definitive CT evidence of right-sided heart strain or pulmonary infarction. 2. Overall findings suggestive of worsening metastatic bronchogenic carcinoma with suspected minimal increase in right suprahilar mass and associated mediastinal adenopathy, now with near complete obstruction of the right middle and lower lobes. Worsening interlobular septal thickening within the residual aerated right upper lobe is nonspecific though could be indicative of lymphangitic spread of tumor. Continued attention on follow-up is recommended. 3. Interval development of small right-sided pleural effusion. 4. Worsening interlobular septal thickening within the contralateral left lung, nonspecific though could be indicative of pulmonary edema, though again, lymphangitic spread of tumor is not excluded. Critical Value/emergent results were called by telephone at the time of interpretation on 03/07/2013 at 2:26 PM to Dr. Sharilyn Sites , who verbally acknowledged these results.   Electronically Signed   By: Simonne Come M.D.   On: 03/07/2013 14:32   Dg Chest Port 1 View  03/07/2013   CLINICAL DATA:  Shortness of breath and difficulty breathing  EXAM: PORTABLE CHEST - 1 VIEW  COMPARISON:  02/18/2013  FINDINGS: There is again noted fullness in the hilum and right suprahilar at/ peritracheal region consistent with the given history of centrally obstructing mass lesion. Increasing consolidation is noted in the right lower lobe. The left lung is clear.  IMPRESSION: Persistent changes consistent  with the given clinical history. There is increasing right basilar consolidation identified likely related to post obstructive changes.   Electronically Signed   By: Alcide Clever M.D.   On: 03/07/2013 12:41    CRITICAL CARE Performed by: Garlon Hatchet   Total critical care time: 30  Critical care time was exclusive of separately billable procedures and treating other patients.  Critical care was necessary to treat or prevent imminent or life-threatening deterioration.  Critical care was time spent personally by me on the following activities: development of treatment plan with patient and/or surrogate as well as nursing, discussions with consultants, evaluation of patient's response to treatment, examination of patient, obtaining history from patient or surrogate, ordering and performing treatments and interventions, ordering and review of laboratory studies, ordering and review of radiographic studies, pulse oximetry and re-evaluation of patient's condition.  Medications  aspirin tablet 325 mg (325 mg Oral Given 03/07/13 1242)  heparin ADULT infusion 100 units/mL (25000 units/250 mL) (1,000 Units/hr Intravenous New Bag/Given 03/07/13 1421)  albuterol (PROVENTIL,VENTOLIN) solution continuous neb (not administered)  iohexol (OMNIPAQUE) 350 MG/ML injection 100 mL (100 mLs Intravenous Contrast Given 03/07/13 1324)  heparin bolus via infusion 4,000 Units (0 Units Intravenous Stopped 03/07/13 1427)     MDM   1. Non-small cell cancer of right lung   2. Shortness of breath   3. Elevated troponin   4. HYPERTENSION    EKG NSR, no acute ischemic changes.  POC troponin elevated at 0.16, troponin also elevated at 0.35-- ASA given, patient will be started on heparin. Unlikely ACS as pt has no chest pain, more likely cardiac strain.  Other labs with subtle changes, consistent with her baseline when compared with previous.  CTA pending at this time for evaluation of PE given active cancer and  sudden hypoxia.  2:44 PM Notified by radiology patient has multiple small, pulmonary emboli in left lung along with progression of right lung tumor now with complete obstruction of right mainstem  bronchus and subsequent collapse of right middle and upper lobes.  Pt maintaining O2 sats of 93% on non-rebreather.  ABG completed, noted hypoxia without respiratory acidosis.  RT therapy has continued to monitor pt for possible placement of BiPAP.  Consulted critical care, they have evaluated pt in the ED and will admit.  Garlon Hatchet, PA-C 03/07/13 [phone]

## 2013-03-07 NOTE — Progress Notes (Signed)
Walk in form received for this patient that reads "shortness of breath".  Noted patient has just left RT department.  Newly diagnosed Rt. Lung cancer.  Med list reads Spiriva, Flonase, Tussionex and Proventil.  Assessed her and noted slightly pale skin, no respiratory distress symptoms of s.o.b. Noted.  Breathing regular, even and unlabored.  Lung sounds not assessed but no auditory distress sounds audible to ear.  Reports she "feels worse s.o.b when I walk".  Admits to a cough that produces white frothy sputum at times.  No oxygen in the home.  Dr. Arbutus Ped notified and orders for patient to go to ED received and read back.  Patient notified and at 11:40 am called [phone] and charge nurse [facility] notified.

## 2013-03-07 NOTE — Progress Notes (Signed)
rechecked O2 sat after a few more minutes rest.

## 2013-03-07 NOTE — ED Provider Notes (Signed)
Medical screening examination/treatment/procedure(s) were conducted as a shared visit with non-physician practitioner(s) and myself.  I personally evaluated the patient during the encounter.  EKG Interpretation   None         Patient is a 62 y.o. female with history of new diagnosed bronchogenic carcinoma who presents the emergency room shortness of breath. Here she is hypoxic. She does have an oxygen requirement. She is in no distress however. She is diffuse expiratory wheeze. Her troponin is positive but I think this is do to strain. She also has multiple small pulmonary emboli. Will heparinize. Her oxygen saturations are fluctuating between 87 and 89% on 15 L nonrebreather. Will check blood gas, and give albuterol and reassess. Patient may need BiPAP. She confirms she is a full code. Will discuss with critical care for admission.  Meagan Maw Azaiah Mello, DO 03/07/13 1451

## 2013-03-07 NOTE — Progress Notes (Signed)
Reports she just came back from the bathroom.

## 2013-03-07 NOTE — Progress Notes (Signed)
ANTICOAGULATION CONSULT NOTE - Follow Up Consult  Pharmacy Consult for Heparin Indication: chest pain/ACS and pulmonary embolus  No Known Allergies  Patient Measurements: Height: 5\' 5"  (165.1 cm) Weight: 207 lb 7.3 oz (94.1 kg) IBW/kg (Calculated) : 57 Heparin Dosing Weight:   Vital Signs: Temp: 97.6 F (36.4 C) (12/29 1940) Temp src: Oral (12/29 1940) BP: 163/69 mmHg (12/29 1940) Pulse Rate: 111 (12/29 1940)  Labs:  Recent Labs  03/07/13 1210 03/07/13 2030  HGB 13.7  --   HCT 41.8  --   PLT 242  --   HEPARINUNFRC  --  <0.10*  CREATININE 0.88  --   TROPONINI 0.35*  --     Estimated Creatinine Clearance: 76.1 ml/min (by C-G formula based on Cr of 0.88).   Medications:  Infusions:  . heparin      Assessment: Patient with r/o ACS and PE.  Heparin level low, no issues per RN.  Goal of Therapy:  Heparin level 0.3-0.7 units/ml Monitor platelets by anticoagulation protocol: Yes   Plan:  Heparin bolus 1500 units iv x1 Heparin drip at 1450 units/hr Daily heparin level and CBC Next heparin level at  0800    Darlina Guys, Jacquenette Shone Crowford 03/07/2013,10:54 PM

## 2013-03-07 NOTE — Progress Notes (Signed)
Utilization Review completed.  Abhinav Mayorquin RN CM  

## 2013-03-07 NOTE — ED Notes (Signed)
Per RN from cancer center called-patient has radiation today and stated she was SOB, states O2 in the 70s-patient was wheeled over by husband-placed on 5L [place]-

## 2013-03-07 NOTE — ED Notes (Signed)
Patient transported to CT 

## 2013-03-07 NOTE — ED Provider Notes (Signed)
Medical screening examination/treatment/procedure(s) were performed by non-physician practitioner and as supervising physician I was immediately available for consultation/collaboration.  EKG Interpretation   None         Layla Maw Ward, DO 03/07/13 1648

## 2013-03-07 NOTE — ED Notes (Signed)
Pt c/o SOB with exertion, had radiation today, O2 saturations 78 on 3 liters of oxygen. Pt is not on oxygen at home. Pt also c/o productive cough, Pt denies pain

## 2013-03-08 ENCOUNTER — Encounter: Payer: Self-pay | Admitting: Radiation Oncology

## 2013-03-08 ENCOUNTER — Encounter: Payer: Self-pay | Admitting: Internal Medicine

## 2013-03-08 ENCOUNTER — Ambulatory Visit
Admission: RE | Admit: 2013-03-08 | Discharge: 2013-03-08 | Disposition: A | Discharge status: Home / Self Care | Payer: BC Managed Care – PPO | Source: Ambulatory Visit | Attending: Radiation Oncology | Admitting: Radiation Oncology

## 2013-03-08 ENCOUNTER — Other Ambulatory Visit: Payer: BC Managed Care – PPO

## 2013-03-08 ENCOUNTER — Ambulatory Visit: Payer: BC Managed Care – PPO | Admitting: Internal Medicine

## 2013-03-08 ENCOUNTER — Ambulatory Visit: Payer: BC Managed Care – PPO

## 2013-03-08 ENCOUNTER — Inpatient Hospital Stay (HOSPITAL_COMMUNITY): Payer: BC Managed Care – PPO

## 2013-03-08 ENCOUNTER — Telehealth: Payer: Self-pay | Admitting: Oncology

## 2013-03-08 VITALS — BP 136/80 | HR 111 | Temp 98.3°F | Resp 22

## 2013-03-08 DIAGNOSIS — R918 Other nonspecific abnormal finding of lung field: Secondary | ICD-10-CM

## 2013-03-08 DIAGNOSIS — J9819 Other pulmonary collapse: Secondary | ICD-10-CM | POA: Diagnosis present

## 2013-03-08 DIAGNOSIS — M79609 Pain in unspecified limb: Secondary | ICD-10-CM

## 2013-03-08 DIAGNOSIS — R609 Edema, unspecified: Secondary | ICD-10-CM

## 2013-03-08 DIAGNOSIS — I2699 Other pulmonary embolism without acute cor pulmonale: Secondary | ICD-10-CM | POA: Diagnosis present

## 2013-03-08 DIAGNOSIS — C3491 Malignant neoplasm of unspecified part of right bronchus or lung: Secondary | ICD-10-CM

## 2013-03-08 LAB — CBC
MCH: 27.8 pg (ref 26.0–34.0)
MCV: 84.9 fL (ref 78.0–100.0)
Platelets: 262 10*3/uL (ref 150–400)
RBC: 4.85 MIL/uL (ref 3.87–5.11)
WBC: 10.8 10*3/uL — ABNORMAL HIGH (ref 4.0–10.5)

## 2013-03-08 LAB — BASIC METABOLIC PANEL
BUN: 8 mg/dL (ref 6–23)
CO2: 18 mEq/L — ABNORMAL LOW (ref 19–32)
Calcium: 9.6 mg/dL (ref 8.4–10.5)
Chloride: 98 mEq/L (ref 96–112)
Creatinine, Ser: 0.87 mg/dL (ref 0.50–1.10)
GFR calc Af Amer: 82 mL/min — ABNORMAL LOW (ref >90)
GFR calc non Af Amer: 70 mL/min — ABNORMAL LOW (ref >90)
Glucose, Bld: 114 mg/dL — ABNORMAL HIGH (ref 70–99)
Potassium: 4.5 mEq/L (ref 3.7–5.3)

## 2013-03-08 LAB — HEPARIN LEVEL (UNFRACTIONATED): Heparin Unfractionated: 0.44 IU/mL (ref 0.30–0.70)

## 2013-03-08 LAB — MAGNESIUM: Magnesium: 2.2 mg/dL (ref 1.5–2.5)

## 2013-03-08 LAB — PHOSPHORUS: Phosphorus: 4.6 mg/dL (ref 2.3–4.6)

## 2013-03-08 MED ORDER — ACETYLCYSTEINE 10 % IN SOLN
2.0000 mL | Freq: Four times a day (QID) | RESPIRATORY_TRACT | Status: DC
  Filled 2013-03-08 (×4): qty 4

## 2013-03-08 MED ORDER — HEPARIN BOLUS VIA INFUSION
1000.0000 [IU] | Freq: Once | INTRAVENOUS | Status: AC
  Administered 2013-03-08: 1000 [IU] via INTRAVENOUS
  Filled 2013-03-08: qty 1000

## 2013-03-08 MED ORDER — PANTOPRAZOLE SODIUM 40 MG PO TBEC
40.0000 mg | DELAYED_RELEASE_TABLET | Freq: Every day | ORAL | Status: DC
  Administered 2013-03-08 – 2013-03-15 (×8): 40 mg via ORAL
  Filled 2013-03-08 (×8): qty 1

## 2013-03-08 MED ORDER — ACETYLCYSTEINE 20 % IN SOLN
4.0000 mL | Freq: Four times a day (QID) | RESPIRATORY_TRACT | Status: DC
  Administered 2013-03-08 – 2013-03-12 (×11): 4 mL via RESPIRATORY_TRACT
  Filled 2013-03-08 (×19): qty 4

## 2013-03-08 MED ORDER — RADIAPLEXRX EX GEL
Freq: Once | CUTANEOUS | Status: AC
  Administered 2013-03-08: 14:00:00 via TOPICAL

## 2013-03-08 MED ORDER — METHYLPREDNISOLONE SODIUM SUCC 125 MG IJ SOLR
60.0000 mg | Freq: Four times a day (QID) | INTRAMUSCULAR | Status: DC
  Administered 2013-03-08 – 2013-03-12 (×17): 60 mg via INTRAVENOUS
  Filled 2013-03-08 (×10): qty 0.96
  Filled 2013-03-08: qty 2
  Filled 2013-03-08 (×6): qty 0.96
  Filled 2013-03-08: qty 2
  Filled 2013-03-08 (×2): qty 0.96
  Filled 2013-03-08: qty 2
  Filled 2013-03-08 (×3): qty 0.96

## 2013-03-08 MED ORDER — LORAZEPAM 0.5 MG PO TABS
0.5000 mg | ORAL_TABLET | Freq: Every day | ORAL | Status: DC
  Administered 2013-03-08 – 2013-03-15 (×8): 0.5 mg via ORAL
  Filled 2013-03-08 (×8): qty 1

## 2013-03-08 MED ORDER — VANCOMYCIN HCL 10 G IV SOLR
1250.0000 mg | Freq: Two times a day (BID) | INTRAVENOUS | Status: DC
  Administered 2013-03-08 – 2013-03-10 (×6): 1250 mg via INTRAVENOUS
  Filled 2013-03-08 (×8): qty 1250

## 2013-03-08 MED ORDER — DEXTROSE 5 % IV SOLN
1.0000 g | Freq: Three times a day (TID) | INTRAVENOUS | Status: DC
  Administered 2013-03-08 – 2013-03-12 (×12): 1 g via INTRAVENOUS
  Filled 2013-03-08 (×12): qty 1

## 2013-03-08 MED ORDER — ENSURE COMPLETE PO LIQD
237.0000 mL | Freq: Two times a day (BID) | ORAL | Status: DC
  Administered 2013-03-08 – 2013-03-16 (×10): 237 mL via ORAL

## 2013-03-08 MED ORDER — ACETYLCYSTEINE 20 % IN SOLN
4.0000 mL | Freq: Four times a day (QID) | RESPIRATORY_TRACT | Status: DC
  Administered 2013-03-08 (×2): 4 mL via RESPIRATORY_TRACT
  Filled 2013-03-08 (×5): qty 4

## 2013-03-08 MED ORDER — HEPARIN (PORCINE) IN NACL 100-0.45 UNIT/ML-% IJ SOLN
1600.0000 [IU]/h | INTRAMUSCULAR | Status: DC
  Administered 2013-03-09 (×2): 1600 [IU]/h via INTRAVENOUS
  Filled 2013-03-08 (×7): qty 250

## 2013-03-08 MED ORDER — DEXTROSE 5 % IV SOLN
1.0000 g | Freq: Two times a day (BID) | INTRAVENOUS | Status: DC
  Administered 2013-03-08: 1 g via INTRAVENOUS
  Filled 2013-03-08 (×2): qty 1

## 2013-03-08 MED ORDER — BIOTENE DRY MOUTH MT LIQD
15.0000 mL | Freq: Two times a day (BID) | OROMUCOSAL | Status: DC
  Administered 2013-03-08 – 2013-03-16 (×16): 15 mL via OROMUCOSAL

## 2013-03-08 NOTE — Progress Notes (Signed)
Bilateral lower extremity venous duplex completed.  Right:  DVT noted in the peroneal vein.  No evidence of superficial thrombosis.  No Baker's cyst.  Left:  No evidence of DVT, superficial thrombosis, or Baker's cyst.   

## 2013-03-08 NOTE — Progress Notes (Signed)
Subjective: The patient is seen and examined today. Her husband was at the bedside. She was recently diagnosed with a stage IIIB non-small cell lung cancer and started radiotherapy last week under the care of Dr. Roselind Messier. She was supposed to start concurrent radiotherapy today  But the patient was admitted to [facility] with significant dyspnea and collapse of the left lower lobe as well as new pulmonary embolism on the left lung. She is feeling a little bit better today. She denied having any fever or chills.   Objective: Vital signs in last 24 hours: Temp:  [97.6 F (36.4 C)-98.8 F (37.1 C)] 98.3 F (36.8 C) (12/30 1328) Pulse Rate:  [110-116] 114 (12/30 1550) Resp:  [18-29] 29 (12/30 1550) BP: (116-163)/(56-100) 132/56 mmHg (12/30 1550) SpO2:  [90 %-97 %] 92 % (12/30 1550) FiO2 (%):  [100 %] 100 % (12/30 0743) Weight:  [207 lb 7.3 oz (94.1 kg)-213 lb 6.5 oz (96.8 kg)] 213 lb 6.5 oz (96.8 kg) (12/30 0500)  Intake/Output from previous day: 12/29 0701 - 12/30 0700 In: 156 [I.V.:156] Out: 700 [Urine:700] Intake/Output this shift: Total I/O In: 504 [I.V.:204; IV Piggyback:300] Out: 700 [Urine:700]  General appearance: alert, cooperative and mild distress Resp: diminished breath sounds RLL and dullness to percussion RLL Cardio: regular rate and rhythm, S1, S2 normal, no murmur, click, rub or gallop GI: soft, non-tender; bowel sounds normal; no masses,  no organomegaly Extremities: extremities normal, atraumatic, no cyanosis or edema  Lab Results:   Recent Labs  03/07/13 1210 03/08/13 0354  WBC 12.5* 10.8*  HGB 13.7 13.5  HCT 41.8 41.2  PLT 242 262   BMET  Recent Labs  03/07/13 1210 03/08/13 0354  NA 130* 134*  K 4.5 4.5  CL 95* 98  CO2 22 18*  GLUCOSE 108* 114*  BUN 8 8  CREATININE 0.88 0.87  CALCIUM 10.1 9.6    Studies/Results: Ct Angio Chest Pe W/cm &/or Wo Cm  03/07/2013   CLINICAL DATA:  Newly diagnosed right-sided lung cancer post initiation of  chemotherapy and radiation therapy, now with worsening dyspnea upon exertion - evaluate for pulmonary embolism  EXAM: CT ANGIOGRAPHY CHEST WITH CONTRAST  TECHNIQUE: Multidetector CT imaging of the chest was performed using the standard protocol during bolus administration of intravenous contrast. Multiplanar CT image reconstructions including MIPs were obtained to evaluate the vascular anatomy.  CONTRAST:  OMNIPAQUE IOHEXOL 350 MG/ML SOLN  COMPARISON:  Chest CT -02/18/2013  FINDINGS: Vascular Findings:  There is suboptimal opacification of the pulmonary arterial system with the main pulmonary artery measuring 214 Hounsfield units. There are nonocclusive filling defects within multiple left-sided segmental and subsegmental pulmonary arteries (representative left upper lobe - images 69 and 91; lingula - image 115; left lower lobe - images 119 and 136). There is malignant obstruction of the right upper lobe pulmonary artery (image 111, series 6). Otherwise, there are no discrete filling defects within the right-sided pulmonary arterial tree. The overall clot burden is deemed small in volume. Normal caliber of the main pulmonary artery. There is no CT evidence of right-sided heart strain. There is no reflux of contrast into the hepatic venous system.  Normal heart size. No pericardial effusion. A normal caliber of the thoracic aorta. Bovine configuration of the aortic arch. The imaged branches of the aortic arch are widely patent throughout their imaged course. No definite thoracic aortic dissection or periaortic stranding.  Review of the MIP images confirms the above findings.   ----------------------------------------------------------------------------------  Nonvascular Findings:  No definitive evidence of pulmonary infarction.  The known right suprahilar mass is grossly unchanged to minimally increased in size in the interval, currently measuring approximately 5.0 x 6.2 cm (as measured Ing greatest oblique  axial dimension (image 31, series 7), previously, 4.4 x 6.8 cm and, and currently approximately 8.1 cm in greatest craniocaudal dimension (coronal image 56, series 8), previously, 7.8 cm,  though note, exact measurements are difficult secondary to the irregular shape of this mass. This right super hilar mass now results in complete obstruction of the right mainstem bronchus with near complete atelectasis of the right middle and lower lobes with partial atelectasis of the remaining right upper lobe. There is worsening interlobular septal thickening within the aerated right upper lobe. Interval development of a small right-sided pleural effusion.  There is worsening mediastinal lymphadenopathy with index prevascular lymph node now measuring approximately 1.7 cm in greatest oblique axial dimension (image 27, series 4, previously, a 0.8 cm. Worsening left infrahilar lymphadenopathy with index nodal conglomeration measuring approximate 1.7 cm (image 37, series 4, previously, 1.1 cm. Nodal adenopathy continues to extend and surrounded the right lower lobe bronchus (image 40, series 4, grossly unchanged.  Worsening interlobular septal thickening within the left lung, most conspicuous within the bilateral lung bases.  Limited early arterial phase evaluation of the upper abdomen is normal.  There is a persistent slightly mottled appearance of the thoracic spine, likely secondary to patient's obesity. No discrete aggressive osseous abnormalities. Normal appearance of the thyroid gland.  IMPRESSION: 1. The examination is positive for pulmonary embolism involving multiple segmental and subsegmental arteries of all lobes of the left lung. Overall, the clot burden is deemed small. No definitive CT evidence of right-sided heart strain or pulmonary infarction. 2. Overall findings suggestive of worsening metastatic bronchogenic carcinoma with suspected minimal increase in right suprahilar mass and associated mediastinal adenopathy,  now with near complete obstruction of the right middle and lower lobes. Worsening interlobular septal thickening within the residual aerated right upper lobe is nonspecific though could be indicative of lymphangitic spread of tumor. Continued attention on follow-up is recommended. 3. Interval development of small right-sided pleural effusion. 4. Worsening interlobular septal thickening within the contralateral left lung, nonspecific though could be indicative of pulmonary edema, though again, lymphangitic spread of tumor is not excluded. Critical Value/emergent results were called by telephone at the time of interpretation on 03/07/2013 at 2:26 PM to Dr. Sharilyn Sites , who verbally acknowledged these results.   Electronically Signed   By: Simonne Come M.D.   On: 03/07/2013 14:32   Dg Chest Port 1 View  03/08/2013   CLINICAL DATA:  Atelectasis  EXAM: PORTABLE CHEST - 1 VIEW  COMPARISON:  Prior chest x-ray and CT PE study 03/07/2013  FINDINGS: Interval development of whiteout of the right hemi thorax with left-to-right shift of the cardiac and mediastinal structures. There is abrupt cut off of the right main stem bronchus. The left lung remains well it simply well aerated although there is diffuse interstitial prominence which remains unchanged compared to the recent prior imaging. No acute osseous abnormality. No pneumothorax.  IMPRESSION: Interval obstruction of the right main stem bronchus resulting in complete atelectasis of the right lung. Resultant volume loss with left-to-right shift of the cardiac and mediastinal structures.  Given the rapid interval change, mucous plugging in the narrowed right main stem bronchus is the most likely etiology.  These results were called by telephone at the time of interpretation on 03/08/2013 at 8:15 AM to  Dr. Delford Field, who verbally acknowledged these results.   Electronically Signed   By: Malachy Moan M.D.   On: 03/08/2013 08:15   Dg Chest Port 1 View  03/07/2013    CLINICAL DATA:  Shortness of breath and difficulty breathing  EXAM: PORTABLE CHEST - 1 VIEW  COMPARISON:  02/18/2013  FINDINGS: There is again noted fullness in the hilum and right suprahilar at/ peritracheal region consistent with the given history of centrally obstructing mass lesion. Increasing consolidation is noted in the right lower lobe. The left lung is clear.  IMPRESSION: Persistent changes consistent with the given clinical history. There is increasing right basilar consolidation identified likely related to post obstructive changes.   Electronically Signed   By: Alcide Clever M.D.   On: 03/07/2013 12:41    Medications: I have reviewed the patient's current medications.  Assessment/Plan: 1) stage IIIB non-small cell lung cancer, squamous cell carcinoma. She is currently undergoing radiotherapy under the care of Dr. Roselind Messier and was supposed to start the first cycle of her concurrent chemotherapy today. The patient is recently admitted with significant dyspnea secondary to collapse of the right lower lobe as well as new left pulmonary embolus. I will delay the start of chemotherapy until next week. 2) new left pulmonary embolism: currently on treatment with IV heparin drip. Consider switching the patient to subcutaneous Lovenox orders or Xarelto before discharge home. 3) right lower lobe lung collapse: continue radiotherapy as scheduled. Thank you for taking good care of Ms. Roxan Hockey. I will continue to follow the patient with you and assist in her management an as-needed basis.      LOS: 1 day    Meagan Garcia K. 03/08/2013

## 2013-03-08 NOTE — Progress Notes (Addendum)
Name: Meagan Garcia MRN: [id number] DOB: Dec 27, 1950    ADMISSION DATE:  03/07/2013  REFERRING MD :  ed PRIMARY SERVICE: PCCM  CHIEF COMPLAINT:  resp distress, hypoxia  BRIEF PATIENT DESCRIPTION: 62 y.o remote smoker with stage IIIB (T2b., N3, M0) non-small cell lung cancer ( invasive squamous cell) presenting with large right supra-hilar mass with mediastinal and bilateral hilar lymphadenopathy, diagnosed in December of 2014. Started on RT ([facility]) with goal of starting chemo 12/30 (mohammed) presented with worsening sob & hypoxic resp failure, CT scan showing progressive RML/RLL atelectasis & multiple left-sided  segmental and subsegmental PE   SIGNIFICANT EVENTS / STUDIES:  CT chest 12/29 >>pulmonary embolism involving  multiple segmental and subsegmental arteries of all lobes of the  left lung. Overall, the clot burden is small, no strain worsening metastatic bronchogenic  carcinoma with suspected minimal increase in right suprahilar mass  and associated mediastinal adenopathy, now with near complete  obstruction of the right middle and lower lobes. Worsening interlobular septal thickening within the residual aerated right upper lobe is nonspecific though could be indicative of lymphangitic spread of tumor. small right-sided pleural effusion.  Worsening interlobular septal thickening within the contralateral left lung     LINES / TUBES: PIV   CULTURES:   ANTIBIOTICS: 12/30cefepime >> 12/30 vanc >>  SUBJECTIVE:  Dyspnea is better  VITAL SIGNS: Temp:  [97.6 F (36.4 C)-98.8 F (37.1 C)] 98.1 F (36.7 C) (12/30 0743) Pulse Rate:  [98-116] 110 (12/30 0743) Resp:  [18-26] 24 (12/30 0743) BP: (116-163)/(67-82) 131/71 mmHg (12/30 0743) SpO2:  [72 %-97 %] 96 % (12/30 0743) FiO2 (%):  [100 %] 100 % (12/30 0743) Weight:  [94.1 kg (207 lb 7.3 oz)-96.8 kg (213 lb 6.5 oz)] 96.8 kg (213 lb 6.5 oz) (12/30 0500) HEMODYNAMICS: CV stable   VENTILATOR SETTINGS: Vent  Mode:  [-]  FiO2 (%):  [100 %] 100 % NRM INTAKE / OUTPUT: Intake/Output     12/29 0701 - 12/30 0700 12/30 0701 - 12/31 0700   I.V. (mL/kg) 156 (1.6)    Total Intake(mL/kg) 156 (1.6)    Urine (mL/kg/hr) 700    Total Output 700     Net -544            PHYSICAL EXAMINATION: Gen. Pleasant, well-nourished, in mild resp distress, anxious affect ENT - no lesions, no post nasal drip Neck: No JVD, no thyromegaly, no carotid bruits Lungs:decreased BS on R  Cardiovascular: Rhythm regular, heart sounds  normal, no murmurs, no peripheral edema Abdomen: soft and non-tender, no hepatosplenomegaly, BS normal. Musculoskeletal: No deformities, no cyanosis or clubbing Neuro:  alert, non focal Skin:  Warm, no lesions/ rash   LABS:  CBC  Recent Labs Lab 03/07/13 1210 03/08/13 0354  WBC 12.5* 10.8*  HGB 13.7 13.5  HCT 41.8 41.2  PLT 242 262   Coag's No results found for this basename: APTT, INR,  in the last 168 hours BMET  Recent Labs Lab 03/07/13 1210 03/08/13 0354  NA 130* 134*  K 4.5 4.5  CL 95* 98  CO2 22 18*  BUN 8 8  CREATININE 0.88 0.87  GLUCOSE 108* 114*   Electrolytes  Recent Labs Lab 03/07/13 1210 03/08/13 0354  CALCIUM 10.1 9.6  MG  --  2.2  PHOS  --  4.6   Sepsis Markers No results found for this basename: LATICACIDVEN, PROCALCITON, O2SATVEN,  in the last 168 hours ABG  Recent Labs Lab 03/07/13 1445  PHART 7.465*  PCO2ART 29.2*  PO2ART 59.7*   Liver Enzymes  Recent Labs Lab 03/07/13 1210  AST 45*  ALT 89*  ALKPHOS 150*  BILITOT 0.8  ALBUMIN 3.1*   Cardiac Enzymes  Recent Labs Lab 03/07/13 1210  TROPONINI 0.35*  PROBNP 933.4*   Glucose No results found for this basename: GLUCAP,  in the last 168 hours  Imaging Ct Angio Chest Pe W/cm &/or Wo Cm  03/07/2013   CLINICAL DATA:  Newly diagnosed right-sided lung cancer post initiation of chemotherapy and radiation therapy, now with worsening dyspnea upon exertion - evaluate for  pulmonary embolism  EXAM: CT ANGIOGRAPHY CHEST WITH CONTRAST  TECHNIQUE: Multidetector CT imaging of the chest was performed using the standard protocol during bolus administration of intravenous contrast. Multiplanar CT image reconstructions including MIPs were obtained to evaluate the vascular anatomy.  CONTRAST:  OMNIPAQUE IOHEXOL 350 MG/ML SOLN  COMPARISON:  Chest CT -02/18/2013  FINDINGS: Vascular Findings:  There is suboptimal opacification of the pulmonary arterial system with the main pulmonary artery measuring 214 Hounsfield units. There are nonocclusive filling defects within multiple left-sided segmental and subsegmental pulmonary arteries (representative left upper lobe - images 69 and 91; lingula - image 115; left lower lobe - images 119 and 136). There is malignant obstruction of the right upper lobe pulmonary artery (image 111, series 6). Otherwise, there are no discrete filling defects within the right-sided pulmonary arterial tree. The overall clot burden is deemed small in volume. Normal caliber of the main pulmonary artery. There is no CT evidence of right-sided heart strain. There is no reflux of contrast into the hepatic venous system.  Normal heart size. No pericardial effusion. A normal caliber of the thoracic aorta. Bovine configuration of the aortic arch. The imaged branches of the aortic arch are widely patent throughout their imaged course. No definite thoracic aortic dissection or periaortic stranding.  Review of the MIP images confirms the above findings.   ----------------------------------------------------------------------------------  Nonvascular Findings:  No definitive evidence of pulmonary infarction.  The known right suprahilar mass is grossly unchanged to minimally increased in size in the interval, currently measuring approximately 5.0 x 6.2 cm (as measured Ing greatest oblique axial dimension (image 31, series 7), previously, 4.4 x 6.8 cm and, and currently  approximately 8.1 cm in greatest craniocaudal dimension (coronal image 56, series 8), previously, 7.8 cm,  though note, exact measurements are difficult secondary to the irregular shape of this mass. This right super hilar mass now results in complete obstruction of the right mainstem bronchus with near complete atelectasis of the right middle and lower lobes with partial atelectasis of the remaining right upper lobe. There is worsening interlobular septal thickening within the aerated right upper lobe. Interval development of a small right-sided pleural effusion.  There is worsening mediastinal lymphadenopathy with index prevascular lymph node now measuring approximately 1.7 cm in greatest oblique axial dimension (image 27, series 4, previously, a 0.8 cm. Worsening left infrahilar lymphadenopathy with index nodal conglomeration measuring approximate 1.7 cm (image 37, series 4, previously, 1.1 cm. Nodal adenopathy continues to extend and surrounded the right lower lobe bronchus (image 40, series 4, grossly unchanged.  Worsening interlobular septal thickening within the left lung, most conspicuous within the bilateral lung bases.  Limited early arterial phase evaluation of the upper abdomen is normal.  There is a persistent slightly mottled appearance of the thoracic spine, likely secondary to patient's obesity. No discrete aggressive osseous abnormalities. Normal appearance of the thyroid gland.  IMPRESSION: 1. The examination  is positive for pulmonary embolism involving multiple segmental and subsegmental arteries of all lobes of the left lung. Overall, the clot burden is deemed small. No definitive CT evidence of right-sided heart strain or pulmonary infarction. 2. Overall findings suggestive of worsening metastatic bronchogenic carcinoma with suspected minimal increase in right suprahilar mass and associated mediastinal adenopathy, now with near complete obstruction of the right middle and lower lobes. Worsening  interlobular septal thickening within the residual aerated right upper lobe is nonspecific though could be indicative of lymphangitic spread of tumor. Continued attention on follow-up is recommended. 3. Interval development of small right-sided pleural effusion. 4. Worsening interlobular septal thickening within the contralateral left lung, nonspecific though could be indicative of pulmonary edema, though again, lymphangitic spread of tumor is not excluded. Critical Value/emergent results were called by telephone at the time of interpretation on 03/07/2013 at 2:26 PM to Dr. Sharilyn Sites , who verbally acknowledged these results.   Electronically Signed   By: Simonne Come M.D.   On: 03/07/2013 14:32   Dg Chest Port 1 View  03/07/2013   CLINICAL DATA:  Shortness of breath and difficulty breathing  EXAM: PORTABLE CHEST - 1 VIEW  COMPARISON:  02/18/2013  FINDINGS: There is again noted fullness in the hilum and right suprahilar at/ peritracheal region consistent with the given history of centrally obstructing mass lesion. Increasing consolidation is noted in the right lower lobe. The left lung is clear.  IMPRESSION: Persistent changes consistent with the given clinical history. There is increasing right basilar consolidation identified likely related to post obstructive changes.   Electronically Signed   By: Alcide Clever M.D.   On: 03/07/2013 12:41     ZOX:WRUEA collapse R lung  ASSESSMENT / PLAN:  PULMONARY A: Acute hypoxic resp failure Total collapse R lung d/t CA and mucus plugging Progressive stage IIIB (T2b., N3, M0) non-small cell lung cancer causing endobronchial obstruction, CT s/o lymphangitic spread and Acute PE -no RV strain on CT but pos trop ? Underlying COPd, remote smoker P:   O2 -high flow, surprisingly tolerating hypoxia very well , no wob Iv heparin F/u Duplex BLEs Increase pulm toilet, may need FOB Start cefepime/vanco (not started on adm)  CARDIOVASCULAR A: Htn P:  Hold BP  meds  RENAL A:  At risk AKI -contrast P:   hydrate  GASTROINTESTINAL A:  No issues P:   Adv diet  HEMATOLOGIC/ONC A: Lung CA P:  Monitor hb,plts on heparin Spoke to [employer], hold chemrx for now Spoke to XRT to continue   INFECTIOUS A:  RLL post obs pna P:   Start Cefepime/vanc empiric  ENDOCRINE A:  No issues   P:   CBgs while npo  NEUROLOGIC A:  No issues P:   MRI was planned to complete staging, defer for now  TODAY'S SUMMARY: Total collapse R lung, Lung CA, PE.  Plan pulm toilet, may need fob, XRT , hold chemorx. Keep in icu.   I have personally obtained a history, examined the patient, evaluated laboratory and imaging results, formulated the assessment and plan and placed orders. CRITICAL CARE: The patient is critically ill with multiple organ systems failure and requires high complexity decision making for assessment and support, frequent evaluation and titration of therapies, application of advanced monitoring technologies and extensive interpretation of multiple databases. Critical Care Time devoted to patient care services described in this note is 45 minutes.   Shan Levans MD Beeper  [phone]  Cell  [phone]  If no response  or cell goes to voicemail, call beeper [phone]  Pulmonary and Critical Care Medicine [facility] Pager: [phone]  03/08/2013, 8:10 AM

## 2013-03-08 NOTE — Telephone Encounter (Signed)
Talked to Tu's nurse, Tia, RN.  She said Toneisha had just been walking.  She is on a non-rebreather.  She is willing to try radiation treatment today.  Notified Dr. Michell Heinrich and she is OK to treat today.

## 2013-03-08 NOTE — Progress Notes (Signed)
ANTIBIOTIC CONSULT NOTE - INITIAL  Pharmacy Consult for Vancomycin, Cefepime Indication: pneumonia  No Known Allergies  Patient Measurements: Height: 5\' 5"  (165.1 cm) Weight: 213 lb 6.5 oz (96.8 kg) IBW/kg (Calculated) : 57  Vital Signs: Temp: 98.1 F (36.7 C) (12/30 0743) Temp src: Oral (12/30 0743) BP: 131/71 mmHg (12/30 0743) Pulse Rate: 110 (12/30 0743) Intake/Output from previous day: 12/29 0701 - 12/30 0700 In: 156 [I.V.:156] Out: 700 [Urine:700]  Labs:  Recent Labs  03/07/13 1210 03/08/13 0354  WBC 12.5* 10.8*  HGB 13.7 13.5  PLT 242 262  CREATININE 0.88 0.87   Estimated Creatinine Clearance: 78.1 ml/min (by C-G formula based on Cr of 0.87).  Microbiology: Recent Results (from the past 720 hour(s))  CULTURE, BLOOD (ROUTINE X 2)     Status: None   Collection Time    02/18/13  8:35 PM      Result Value Range Status   Specimen Description BLOOD LEFT HAND   Final   Special Requests BOTTLES DRAWN AEROBIC ONLY 3CC   Final   Culture  Setup Time     Final   Value: 02/19/2013 01:26     Performed at [facility]   Culture     Final   Value: NO GROWTH 5 DAYS     Performed at [facility]   Report Status 02/25/2013 FINAL   Final  CULTURE, BLOOD (ROUTINE X 2)     Status: None   Collection Time    02/18/13  8:47 PM      Result Value Range Status   Specimen Description BLOOD LEFT HAND   Final   Special Requests BOTTLES DRAWN AEROBIC ONLY 2CC   Final   Culture  Setup Time     Final   Value: 02/19/2013 01:26     Performed at [facility]   Culture     Final   Value: NO GROWTH 5 DAYS     Performed at [facility]   Report Status 02/25/2013 FINAL   Final  RESPIRATORY VIRUS PANEL     Status: None   Collection Time    02/18/13 10:49 PM      Result Value Range Status   Source - RVPAN NASOPHARYNGEAL   Final   Respiratory Syncytial Virus A NOT DETECTED   Final   Respiratory Syncytial Virus B NOT DETECTED   Final   Influenza A NOT  DETECTED   Final   Influenza B NOT DETECTED   Final   Parainfluenza 1 NOT DETECTED   Final   Parainfluenza 2 NOT DETECTED   Final   Parainfluenza 3 NOT DETECTED   Final   Metapneumovirus NOT DETECTED   Final   Rhinovirus NOT DETECTED   Final   Adenovirus NOT DETECTED   Final   Influenza A H1 NOT DETECTED   Final   Influenza A H3 NOT DETECTED   Final   Comment: (NOTE)           Normal Reference Range for each Analyte: NOT DETECTED     Testing performed using the Luminex xTAG Respiratory Viral Panel test     kit.     This test was developed and its performance characteristics determined     by [facility]. It has not been cleared or approved by the [place]     Food and Drug Administration. This test is used for clinical purposes.     It should not be regarded as investigational or for  research. This     laboratory is certified under the Clinical Laboratory Improvement     Amendments of 1988 (CLIA) as qualified to perform high complexity     clinical laboratory testing.     Performed at [facility]  CULTURE, EXPECTORATED SPUTUM-ASSESSMENT     Status: None   Collection Time    02/19/13  6:08 AM      Result Value Range Status   Specimen Description SPUTUM   Final   Special Requests NONE   Final   Sputum evaluation     Final   Value: MICROSCOPIC FINDINGS SUGGEST THAT THIS SPECIMEN IS NOT REPRESENTATIVE OF LOWER RESPIRATORY SECRETIONS. PLEASE RECOLLECT.     CALLED TO BALDWIN,R/3E @0703  ON 02/19/13 BY KARCZEWSKI,S.   Report Status 02/19/2013 FINAL   Final  CULTURE, EXPECTORATED SPUTUM-ASSESSMENT     Status: None   Collection Time    02/19/13 10:48 AM      Result Value Range Status   Specimen Description SPUTUM   Final   Special Requests Normal   Final   Sputum evaluation     Final   Value: MICROSCOPIC FINDINGS SUGGEST THAT THIS SPECIMEN IS NOT REPRESENTATIVE OF LOWER RESPIRATORY SECRETIONS. PLEASE RECOLLECT.     NOTIFIED BLACK,K AT 1140 ON [id number] BY HOOKER,B   Report  Status 02/19/2013 FINAL   Final  CULTURE, EXPECTORATED SPUTUM-ASSESSMENT     Status: None   Collection Time    02/19/13  3:00 PM      Result Value Range Status   Specimen Description SPUTUM   Final   Special Requests Normal   Final   Sputum evaluation     Final   Value: MICROSCOPIC FINDINGS SUGGEST THAT THIS SPECIMEN IS NOT REPRESENTATIVE OF LOWER RESPIRATORY SECRETIONS. PLEASE RECOLLECT.     NOTIFIED KIM ATTENDING AT 1545 ON [id number] BY HOOKER,B   Report Status 02/19/2013 FINAL   Final  MRSA PCR SCREENING     Status: None   Collection Time    02/21/13  4:36 PM      Result Value Range Status   MRSA by PCR NEGATIVE  NEGATIVE Final   Comment:            The GeneXpert MRSA Assay (FDA     approved for NASAL specimens     only), is one component of a     comprehensive MRSA colonization     surveillance program. It is not     intended to diagnose MRSA     infection nor to guide or     monitor treatment for     MRSA infections.    Medical History: Past Medical History  Diagnosis Date  . Anxiety   . Depression   . Peripheral neuropathy     feet: unknown etiol  . Elevated glucose   . Allergic rhinitis   . GERD (gastroesophageal reflux disease)   . Complication of anesthesia w/tubal    got anest. "went crazy"     Anti-infectives: 12/30 >> Cefepime >> 12/30 >> Vanc >>    Assessment: 41 yoF with PMH significant for newly diagnosed right bronchogenic mass (diagnosed 02/18/13 by CT scan) presents with SOB with exertion s/p radiation treatment.  She is found to have PE and DVT.  Pharmacy is consulted to dose IV cefepime and vancomycin for post obstructive pneumonia.  Tmax: afeb  WBCs: 10.8  Renal: SCr 0.87 with CrCl ~ 78 ml/min   Goal of Therapy:  Vancomycin trough level 15-20 mcg/ml Appropriate  abx dosing, eradication of infection.  Plan:   Cefepime 1g IV q8h  Vancomycin 1250mg  IV q12h.  Measure Vanc trough at steady state.  Follow up renal fxn and culture  results.   Lynann Beaver PharmD, BCPS Pager [phone] 03/08/2013 10:27 AM

## 2013-03-08 NOTE — Progress Notes (Signed)
INITIAL NUTRITION ASSESSMENT  DOCUMENTATION CODES Per approved criteria  -Obesity Unspecified   INTERVENTION: Provide Ensure Complete BID Encourage PO intake as tolerated  NUTRITION DIAGNOSIS: Inadequate oral intake related to decreased appetite as evidenced by 4.5 % weight loss in less than one month.   Goal: Pt to meet >/= 90% of their estimated nutrition needs   Monitor:  PO intake Weight Labs  Reason for Assessment: Malnutrition Screening Tool, score of 3  62 y.o. 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 Started on RT ([facility]) with goal of starting chemo 12/30 (mohammed) presented with worsening sob & hypoxic resp failure, CT scan showing progressive RML/RLL atelectasis & multiple left-sided segmental and subsegmental PE.  Pt reports having a poor appetite for the past couple weeks. She states she has still been eating daily but, less than usual. She recently started drinking Ensure supplements. Pt seen by RD 12/12 at which time pt was eating well and weighed 223 lbs- pt has lost 4.5% of her body weight since.   Height: Ht Readings from Last 1 Encounters:  03/07/13 5\' 5"  (1.651 m)    Weight: Wt Readings from Last 1 Encounters:  03/08/13 213 lb 6.5 oz (96.8 kg)    Ideal Body Weight: 125 lbs  % Ideal Body Weight: 170%  Wt Readings from Last 10 Encounters:  03/08/13 213 lb 6.5 oz (96.8 kg)  02/24/13 216 lb 4.8 oz (98.113 kg)  02/22/13 220 lb 3.8 oz (99.9 kg)  02/22/13 220 lb 3.8 oz (99.9 kg)  02/22/13 220 lb 3.8 oz (99.9 kg)  02/15/13 223 lb (101.152 kg)  02/07/13 221 lb (100.245 kg)  01/04/13 225 lb (102.059 kg)  06/21/12 229 lb (103.874 kg)  04/14/12 234 lb (106.142 kg)    Usual Body Weight: 230 lbs  %  Usual Body Weight: 93%  BMI:  Body mass index is 35.51 kg/(m^2).  Estimated Nutritional Needs: Kcal: 2000-2200 Protein: 100-115 grams Fluid: >/= 2.2 L/day  Skin: intact  Diet Order: General  EDUCATION NEEDS: -No education needs identified at this time   Intake/Output Summary (Last 24 hours) at 03/08/13 1417 Last data filed at 03/08/13 1400  Gross per 24 hour  Intake    578 ml  Output   1000 ml  Net   -422 ml    Last BM: WDL   Labs:   Recent Labs Lab 03/07/13 1210 03/08/13 0354  NA 130* 134*  K 4.5 4.5  CL 95* 98  CO2 22 18*  BUN 8 8  CREATININE 0.88 0.87  CALCIUM 10.1 9.6  MG  --  2.2  PHOS  --  4.6  GLUCOSE 108* 114*    CBG (last 3)  No results found for this basename: GLUCAP,  in the last 72 hours  Scheduled Meds: . acetylcysteine  4 mL Nebulization QID  . aspirin  325 mg Oral Daily  . ceFEPime (MAXIPIME) IV  1 g Intravenous Q8H  . ipratropium-albuterol  3 mL Nebulization Q6H  . methylPREDNISolone (SOLU-MEDROL) injection  60 mg Intravenous Q6H  . pantoprazole  40 mg Oral Daily  . vancomycin  1,250 mg Intravenous Q12H    Continuous Infusions: . heparin 1,600 Units/hr (03/08/13 1000)    Past Medical History  Diagnosis Date  . Anxiety   . Depression   .  Peripheral neuropathy     feet: unknown etiol  . Elevated glucose   . Allergic rhinitis   . GERD (gastroesophageal reflux disease)   . Complication of anesthesia w/tubal    got anest. "went crazy"     Past Surgical History  Procedure Laterality Date  . Abdominal hysterectomy      partial  . Tubal reversal    . Tubal ligation      x2  . Fibroid tumors    . Knee arthroscopy    . Foot surgery      cysts from both feet  . Video bronchoscopy Bilateral 02/21/2013    Procedure: VIDEO BRONCHOSCOPY WITHOUT FLUORO;  Surgeon: Lonia Farber, MD;  Location: [facility];  Service: Cardiopulmonary;  Laterality: Bilateral;    Ian Malkin RD, LDN Inpatient Clinical  Dietitian Pager: [phone] After Hours Pager: [phone]

## 2013-03-08 NOTE — Progress Notes (Signed)
eLink Physician-Brief Progress Note Patient Name: Meagan Garcia DOB: [birth year] MRN: [id number]  Date of Service  03/08/2013   HPI/Events of Note   Pt requesting home dose of ativan, takes 1mg  qhs. I would like to minimize sedating meds since she needs maximal secretion clearance.   eICU Interventions  - order 1/2 home dose ativan, consider benadryl if she needs something for sleep later.    Intervention Category Minor Interventions: Agitation / anxiety - evaluation and management  [name] S. 03/08/2013, 6:17 PM

## 2013-03-08 NOTE — Progress Notes (Signed)
Weekly Management Note Current Dose: 7.2 Gy  Projected Dose:63 Gy   Narrative:  The patient presents for routine under treatment assessment.  CBCT/MVCT images/Port film x-rays were reviewed.  The chart was checked. Doing well. In hospital. RN education performed.   Physical Findings:  IVpole. Alert and oriented.   Vitals:  Filed Vitals:   03/08/13 1328  BP: 136/80  Pulse: 111  Temp: 98.3 F (36.8 C)  Resp: 22   Weight:  Wt Readings from Last 3 Encounters:  03/08/13 213 lb 6.5 oz (96.8 kg)  02/24/13 216 lb 4.8 oz (98.113 kg)  02/22/13 220 lb 3.8 oz (99.9 kg)   Lab Results  Component Value Date   WBC 10.8* 03/08/2013   HGB 13.5 03/08/2013   HCT 41.2 03/08/2013   MCV 84.9 03/08/2013   PLT 262 03/08/2013   Lab Results  Component Value Date   CREATININE 0.87 03/08/2013   BUN 8 03/08/2013   NA 134* 03/08/2013   K 4.5 03/08/2013   CL 98 03/08/2013   CO2 18* 03/08/2013     Impression:  The patient is tolerating radiation.  Plan:  Continue treatment as planned. Continue RT.

## 2013-03-08 NOTE — Progress Notes (Signed)
ANTICOAGULATION CONSULT NOTE - Follow Up Consult  Pharmacy Consult for Heparin Indication: chest pain/ACS and pulmonary embolus + Right lower DVT  No Known Allergies  Patient Measurements: Height: 5\' 5"  (165.1 cm) Weight: 213 lb 6.5 oz (96.8 kg) IBW/kg (Calculated) : 57 Heparin Dosing Weight: 79 kg  Vital Signs: Temp: 98.3 F (36.8 C) (12/30 1328) Temp src: Oral (12/30 0743) BP: 132/56 mmHg (12/30 1550) Pulse Rate: 114 (12/30 1550)  Labs:  Recent Labs  03/07/13 1210 03/07/13 2030 03/08/13 0354 03/08/13 0800 03/08/13 1616  HGB 13.7  --  13.5  --   --   HCT 41.8  --  41.2  --   --   PLT 242  --  262  --   --   HEPARINUNFRC  --  <0.10*  --  0.21* 0.44  CREATININE 0.88  --  0.87  --   --   TROPONINI 0.35*  --   --   --   --     Estimated Creatinine Clearance: 78.1 ml/min (by C-G formula based on Cr of 0.87).   Medications:  Infusions:  . heparin 1,600 Units/hr (03/08/13 1000)    Assessment: 66 yoF with PMH significant for newly diagnosed right bronchogenic mass (diagnosed 02/18/13 by CT scan) presents with SOB with exertion s/p radiation treatment today.  Patient with r/o ACS and PE.  Pharmacy consulted to dose IV heparin starting 12/29.  CBC: stable, Hgb and Plt WNL  Heparin level = 0.44 (therapeutic) with heparin infusing @ 1600 units/hr  No complications of therapy noted  Goal of Therapy:  Heparin level 0.3-0.7 units/ml Monitor platelets by anticoagulation protocol: Yes   Plan:   Continue heparin @ 1600 units/hr  Recheck heparin level in 6 hr to confirm therapeutic dose  Daily heparin level and CBC  Terrilee Files, PharmD  03/08/2013 5:05 PM

## 2013-03-08 NOTE — Progress Notes (Signed)
Faxed daughter's fmla form to [employer] @ [phone].

## 2013-03-08 NOTE — Progress Notes (Signed)
Weekly rad txs,  4/35  Completed rt lung, patient education done, rad book, radiaplex gel given with instructions of use of skin gel product,discussed pain, sob, throat changes, skin irritation, fatigue, coughing, karen rn business card, teach back given, oxygen on 6 liters n/c, 93%, sats, coughing, no pain, breathing txs in pateint status, heparin IVF's at 78ml/hr and vancomycin at 26ml/hr left thmb area, intact, no reness or swelling at site, patient feels better today still sob, ate banana applesauce,cranberry juice oj and hard to get piece of toast down 1:34 PM

## 2013-03-08 NOTE — Progress Notes (Signed)
Put husband's fmla form on nurse's desk. °

## 2013-03-08 NOTE — Progress Notes (Signed)
ANTICOAGULATION CONSULT NOTE - Follow Up Consult  Pharmacy Consult for Heparin Indication: chest pain/ACS and pulmonary embolus  No Known Allergies  Patient Measurements: Height: 5\' 5"  (165.1 cm) Weight: 213 lb 6.5 oz (96.8 kg) IBW/kg (Calculated) : 57 Heparin Dosing Weight: 79 kg  Vital Signs: Temp: 98.1 F (36.7 C) (12/30 0743) Temp src: Oral (12/30 0743) BP: 131/71 mmHg (12/30 0743) Pulse Rate: 110 (12/30 0743)  Labs:  Recent Labs  03/07/13 1210 03/07/13 2030 03/08/13 0354 03/08/13 0800  HGB 13.7  --  13.5  --   HCT 41.8  --  41.2  --   PLT 242  --  262  --   HEPARINUNFRC  --  <0.10*  --  0.21*  CREATININE 0.88  --  0.87  --   TROPONINI 0.35*  --   --   --     Estimated Creatinine Clearance: 78.1 ml/min (by C-G formula based on Cr of 0.87).   Medications:  Infusions:  . heparin 1,450 Units/hr (03/07/13 2321)    Assessment: 54 yoF with PMH significant for newly diagnosed right bronchogenic mass (diagnosed 02/18/13 by CT scan) presents with SOB with exertion s/p radiation treatment today.  Patient with r/o ACS and PE.  Pharmacy consulted to dose IV heparin starting 12/29.  CBC: stable, Hgb and Plt WNL  Heparin level remains sub-therapeutic but increased at 0.21  RN confirms no interruptions or complications.  Goal of Therapy:  Heparin level 0.3-0.7 units/ml Monitor platelets by anticoagulation protocol: Yes   Plan:   Give heparin 1000 units bolus IV x 1  Increase to heparin IV infusion at 1600 units/hr  Heparin level 6 hours after rate change  Daily heparin level and CBC  Continue to monitor H&H and platelets  Lynann Beaver PharmD, BCPS Pager [phone] 03/08/2013 10:01 AM

## 2013-03-09 ENCOUNTER — Ambulatory Visit
Admission: RE | Admit: 2013-03-09 | Discharge: 2013-03-09 | Disposition: A | Discharge status: Home / Self Care | Payer: BC Managed Care – PPO | Source: Ambulatory Visit | Attending: Radiation Oncology | Admitting: Radiation Oncology

## 2013-03-09 ENCOUNTER — Inpatient Hospital Stay (HOSPITAL_COMMUNITY): Payer: BC Managed Care – PPO

## 2013-03-09 ENCOUNTER — Ambulatory Visit (HOSPITAL_COMMUNITY): Admission: RE | Admit: 2013-03-09 | Payer: BC Managed Care – PPO | Source: Ambulatory Visit

## 2013-03-09 ENCOUNTER — Encounter (HOSPITAL_COMMUNITY)
Admission: EM | Disposition: A | Discharge status: Home / Self Care | Payer: Self-pay | Source: Home / Self Care | Attending: Critical Care Medicine

## 2013-03-09 LAB — HEPARIN LEVEL (UNFRACTIONATED): Heparin Unfractionated: 0.34 IU/mL (ref 0.30–0.70)

## 2013-03-09 LAB — CBC
HCT: 41.2 % (ref 36.0–46.0)
Hemoglobin: 13.1 g/dL (ref 12.0–15.0)
MCH: 27.3 pg (ref 26.0–34.0)
MCHC: 31.8 g/dL (ref 30.0–36.0)
MCV: 86 fL (ref 78.0–100.0)
RBC: 4.79 MIL/uL (ref 3.87–5.11)

## 2013-03-09 LAB — BASIC METABOLIC PANEL
CO2: 18 mEq/L — ABNORMAL LOW (ref 19–32)
Calcium: 9.9 mg/dL (ref 8.4–10.5)
Chloride: 98 mEq/L (ref 96–112)
Creatinine, Ser: 0.87 mg/dL (ref 0.50–1.10)
GFR calc Af Amer: 82 mL/min — ABNORMAL LOW (ref >90)
Glucose, Bld: 193 mg/dL — ABNORMAL HIGH (ref 70–99)

## 2013-03-09 SURGERY — BRONCHOSCOPY, AT BEDSIDE
Anesthesia: Moderate Sedation

## 2013-03-09 MED ORDER — MORPHINE SULFATE 2 MG/ML IJ SOLN
2.0000 mg | INTRAMUSCULAR | Status: DC | PRN
  Filled 2013-03-09: qty 1

## 2013-03-09 MED ORDER — HYDROCODONE-HOMATROPINE 5-1.5 MG/5ML PO SYRP
5.0000 mL | ORAL_SOLUTION | ORAL | Status: DC | PRN
  Administered 2013-03-09 – 2013-03-11 (×4): 5 mL via ORAL
  Filled 2013-03-09 (×4): qty 5

## 2013-03-09 MED ORDER — BUTAMBEN-TETRACAINE-BENZOCAINE 2-2-14 % EX AERO
1.0000 | INHALATION_SPRAY | Freq: Once | CUTANEOUS | Status: DC
  Filled 2013-03-09: qty 56

## 2013-03-09 MED ORDER — MORPHINE SULFATE 4 MG/ML IJ SOLN
INTRAMUSCULAR | Status: AC
  Administered 2013-03-09: 4 mg
  Filled 2013-03-09: qty 1

## 2013-03-09 MED ORDER — MIDAZOLAM HCL 2 MG/2ML IJ SOLN
2.0000 mg | Freq: Once | INTRAMUSCULAR | Status: AC
  Administered 2013-03-09: 2 mg via INTRAVENOUS

## 2013-03-09 MED ORDER — LIDOCAINE HCL 1 % IJ SOLN
INTRAMUSCULAR | Status: AC
  Filled 2013-03-09: qty 20

## 2013-03-09 MED ORDER — MIDAZOLAM HCL 2 MG/2ML IJ SOLN
INTRAMUSCULAR | Status: AC
  Filled 2013-03-09: qty 4

## 2013-03-09 MED ORDER — FENTANYL CITRATE 0.05 MG/ML IJ SOLN
INTRAMUSCULAR | Status: AC
  Filled 2013-03-09: qty 2

## 2013-03-09 MED ORDER — FENTANYL CITRATE 0.05 MG/ML IJ SOLN
25.0000 ug | Freq: Once | INTRAMUSCULAR | Status: AC
  Administered 2013-03-09: 25 ug via INTRAVENOUS

## 2013-03-09 MED ORDER — PHENYLEPHRINE HCL 0.25 % NA SOLN
1.0000 | Freq: Four times a day (QID) | NASAL | Status: DC | PRN
  Administered 2013-03-10: 1 via NASAL
  Filled 2013-03-09 (×2): qty 15

## 2013-03-09 MED ORDER — LIDOCAINE HCL 2 % EX GEL
Freq: Once | CUTANEOUS | Status: DC
  Filled 2013-03-09: qty 5

## 2013-03-09 NOTE — Procedures (Signed)
Bronchoscopy Procedure Note Done at bedside in ICU  Date of Operation: 03/09/2013  Pre-op Diagnosis: Lung ca, collapse R lung  Post-op Diagnosis: same, with mucus plugging and CA totally occluding R mainstem airway  Surgeon: Shan Levans  Anesthesia: Monitored Local Anesthesia with Sedation: 2mg  versed IV, fentanyl IV  Operation: Flexible fiberoptic bronchoscopy, diagnostic   Findings: Mucus plugging and CA totally occluding Right main stem airway  Specimen: Bronchial washing  Estimated Blood Loss: none  Complications: hypoxia post bronch  Indications and History: The patient is a 62 y.o. female with Lung CA and total collapse R lung.  The risks, benefits, complications, treatment options and expected outcomes were discussed with the patient.  The possibilities of reaction to medication, pulmonary aspiration, perforation of a viscus, bleeding, failure to diagnose a condition and creating a complication requiring transfusion or operation were discussed with the patient who freely signed the consent.    Description of Procedure: The patient was re-examined in the bronchoscopy suite and the site of surgery properly noted/marked.  The patient was identified as Meagan Garcia and the procedure verified as Flexible Fiberoptic Bronchoscopy.  A Time Out was held and the above information confirmed.   After the induction of topical nasopharyngeal anesthesia, the patient was positioned  and the bronchoscope was passed through the R nares. The vocal cords were visualized and  1% buffered lidocaine 5 ml was topically placed onto the cords. The cords were normal. The scope was then passed into the trachea.  1% buffered lidocaine 5 ml was used topically on the carina.  Careful inspection of the tracheal lumen was accomplished. The scope was sequentially passed into the left main and then left upper and lower bronchi and segmental bronchi.     The scope was then withdrawn and advanced into  the right main bronchus and then into the RUL, RML, and RLL bronchi and segmental bronchi.  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 Trachea: Normal mucosa Carina: Normal mucosa Right main bronchus: Mass, total occlusion Right mainstem Right upper lobe bronchus: not visualized Right middle lobe bronchus: not visualized Right lower lobe bronchus: not visualized Left main bronchus: Normal mucosa Left upper lobe bronchus: Normal mucosa Left lower lobe bronchus: Normal mucosa   Attestation: I performed the procedure.  Shan Levans MD

## 2013-03-09 NOTE — Progress Notes (Signed)
ANTICOAGULATION CONSULT NOTE - Follow Up Consult  Pharmacy Consult for Heparin Indication: chest pain/ACS, pulmonary embolus and DVT  No Known Allergies  Patient Measurements: Height: 5\' 5"  (165.1 cm) Weight: 213 lb 6.5 oz (96.8 kg) IBW/kg (Calculated) : 57 Heparin Dosing Weight: 79 kg  Vital Signs: Temp: 97.8 F (36.6 C) (12/31 0400) Temp src: Oral (12/31 0400) BP: 131/73 mmHg (12/31 0600) Pulse Rate: 102 (12/31 0600)  Labs:  Recent Labs  03/07/13 1210  03/08/13 0354  03/08/13 1616 03/08/13 2255 03/09/13 0359  HGB 13.7  --  13.5  --   --   --  13.1  HCT 41.8  --  41.2  --   --   --  41.2  PLT 242  --  262  --   --   --  285  HEPARINUNFRC  --   < >  --   < > 0.44 0.44 0.34  CREATININE 0.88  --  0.87  --   --   --  0.87  TROPONINI 0.35*  --   --   --   --   --   --   < > = values in this interval not displayed.  Estimated Creatinine Clearance: 78.1 ml/min (by C-G formula based on Cr of 0.87).   Medications:  Infusions:  . heparin 1,600 Units/hr (03/09/13 0037)    Assessment: 40 yoF with PMH significant for newly diagnosed right bronchogenic mass (diagnosed 02/18/13 by CT scan) presents with SOB with exertion s/p radiation treatment today.  Patient with r/o ACS, +PE and +DVT.  Pharmacy consulted to dose IV heparin starting 12/29.  CBC: Hgb and Plt WNL and stable  Heparin level remains therapeutic at 0.34 this AM.  RN confirms no interruptions or complications.  Goal of Therapy:  Heparin level 0.3-0.7 units/ml Monitor platelets by anticoagulation protocol: Yes   Plan:   Continue heparin IV infusion at 1600 units/hr  Daily heparin level and CBC  Continue to monitor H&H and platelets  Follow up long-term plan for anticoagulation  Lynann Beaver PharmD, BCPS Pager [phone] 03/09/2013 7:54 AM

## 2013-03-09 NOTE — Progress Notes (Signed)
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 Started on RT (Kinard) with goal of starting chemo 12/30 (mohammed) presented with worsening sob & hypoxic resp failure, CT scan showing progressive RML/RLL atelectasis & multiple left-sided  segmental and subsegmental PE   SIGNIFICANT EVENTS / STUDIES:  CT chest 12/29 >>pulmonary embolism involving  multiple segmental and subsegmental arteries of all lobes of the  left lung. Overall, the clot burden is small, no strain worsening metastatic bronchogenic  carcinoma with suspected minimal increase in right suprahilar mass  and associated mediastinal adenopathy, now with near complete  obstruction of the right middle and lower lobes. Worsening interlobular septal thickening within the residual aerated right upper lobe is nonspecific though could be indicative of lymphangitic spread of tumor. small right-sided pleural effusion.  Worsening interlobular septal thickening within the contralateral left lung     LINES / TUBES: PIV   CULTURES: none  ANTIBIOTICS: 12/30cefepime >> 12/30 vanc >>  SUBJECTIVE:  Dyspnea is unchanged. Desaturation occuring.  R lung still collapsed  VITAL SIGNS: Temp:  [97.8 F (36.6 C)-98.3 F (36.8 C)] 97.8 F (36.6 C) (12/31 0400) Pulse Rate:  [102-116] 102 (12/31 0600) Resp:  [17-29] 24 (12/31 0600) BP: (101-147)/(56-100) 131/73 mmHg (12/31 0600) SpO2:  [89 %-98 %] 91 % (12/31 0743) FiO2 (%):  [35 %] 35 % (12/31 0210) HEMODYNAMICS: CV stable   VENTILATOR SETTINGS: Vent Mode:  [-]  FiO2 (%):  [35 %] 35  % VM INTAKE / OUTPUT: Intake/Output     12/30 0701 - 12/31 0700 12/31 0701 - 01/01 0700   I.V. (mL/kg) 568 (5.9)    IV Piggyback 650    Total Intake(mL/kg) 1218 (12.6)    Urine (mL/kg/hr) 1300 (0.6)    Total Output 1300     Net -82            PHYSICAL EXAMINATION: Gen. Pleasant, well-nourished, in mild resp distress, anxious affect ENT - no lesions, no post nasal drip Neck: No JVD, no thyromegaly, no carotid bruits Lungs:decreased BS on R  Cardiovascular: Rhythm regular, heart sounds  normal, no murmurs, no peripheral edema Abdomen: soft and non-tender, no hepatosplenomegaly, BS normal. Musculoskeletal: No deformities, no cyanosis or clubbing Neuro:  alert, non focal Skin:  Warm, no lesions/ rash   LABS:  CBC  Recent Labs Lab 03/07/13 1210 03/08/13 0354 03/09/13 0359  WBC 12.5* 10.8* 15.9*  HGB 13.7 13.5 13.1  HCT 41.8 41.2 41.2  PLT 242 262 285   Coag's No results found for this basename: APTT, INR,  in the last 168 hours BMET  Recent Labs Lab 03/07/13 1210 03/08/13 0354 03/09/13 0359  NA 130* 134* 133*  K 4.5 4.5 4.4  CL 95* 98 98  CO2 22 18* 18*  BUN 8 8 14   CREATININE 0.88 0.87 0.87  GLUCOSE 108* 114* 193*   Electrolytes  Recent Labs Lab 03/07/13 1210 03/08/13 0354 03/09/13 0359  CALCIUM 10.1 9.6 9.9  MG  --  2.2  --   PHOS  --  4.6  --    Sepsis Markers No results found for this basename: LATICACIDVEN,  PROCALCITON, O2SATVEN,  in the last 168 hours ABG  Recent Labs Lab 03/07/13 1445  PHART 7.465*  PCO2ART 29.2*  PO2ART 59.7*   Liver Enzymes  Recent Labs Lab 03/07/13 1210  AST 45*  ALT 89*  ALKPHOS 150*  BILITOT 0.8  ALBUMIN 3.1*   Cardiac Enzymes  Recent Labs Lab 03/07/13 1210  TROPONINI 0.35*  PROBNP 933.4*   Glucose No results found for this basename: GLUCAP,  in the last 168 hours  Imaging Ct Angio Chest Pe W/cm &/or Wo Cm  03/07/2013   CLINICAL DATA:  Newly diagnosed right-sided lung cancer post initiation  of chemotherapy and radiation therapy, now with worsening dyspnea upon exertion - evaluate for pulmonary embolism  EXAM: CT ANGIOGRAPHY CHEST WITH CONTRAST  TECHNIQUE: Multidetector CT imaging of the chest was performed using the standard protocol during bolus administration of intravenous contrast. Multiplanar CT image reconstructions including MIPs were obtained to evaluate the vascular anatomy.  CONTRAST:  OMNIPAQUE IOHEXOL 350 MG/ML SOLN  COMPARISON:  Chest CT -02/18/2013  FINDINGS: Vascular Findings:  There is suboptimal opacification of the pulmonary arterial system with the main pulmonary artery measuring 214 Hounsfield units. There are nonocclusive filling defects within multiple left-sided segmental and subsegmental pulmonary arteries (representative left upper lobe - images 69 and 91; lingula - image 115; left lower lobe - images 119 and 136). There is malignant obstruction of the right upper lobe pulmonary artery (image 111, series 6). Otherwise, there are no discrete filling defects within the right-sided pulmonary arterial tree. The overall clot burden is deemed small in volume. Normal caliber of the main pulmonary artery. There is no CT evidence of right-sided heart strain. There is no reflux of contrast into the hepatic venous system.  Normal heart size. No pericardial effusion. A normal caliber of the thoracic aorta. Bovine configuration of the aortic arch. The imaged branches of the aortic arch are widely patent throughout their imaged course. No definite thoracic aortic dissection or periaortic stranding.  Review of the MIP images confirms the above findings.   ----------------------------------------------------------------------------------  Nonvascular Findings:  No definitive evidence of pulmonary infarction.  The known right suprahilar mass is grossly unchanged to minimally increased in size in the interval, currently measuring approximately 5.0 x 6.2 cm (as measured Ing greatest oblique  axial dimension (image 31, series 7), previously, 4.4 x 6.8 cm and, and currently approximately 8.1 cm in greatest craniocaudal dimension (coronal image 56, series 8), previously, 7.8 cm,  though note, exact measurements are difficult secondary to the irregular shape of this mass. This right super hilar mass now results in complete obstruction of the right mainstem bronchus with near complete atelectasis of the right middle and lower lobes with partial atelectasis of the remaining right upper lobe. There is worsening interlobular septal thickening within the aerated right upper lobe. Interval development of a small right-sided pleural effusion.  There is worsening mediastinal lymphadenopathy with index prevascular lymph node now measuring approximately 1.7 cm in greatest oblique axial dimension (image 27, series 4, previously, a 0.8 cm. Worsening left infrahilar lymphadenopathy with index nodal conglomeration measuring approximate 1.7 cm (image 37, series 4, previously, 1.1 cm. Nodal adenopathy continues to extend and surrounded the right lower lobe bronchus (image 40, series 4, grossly unchanged.  Worsening interlobular septal thickening within the left lung, most conspicuous within the bilateral lung bases.  Limited early arterial phase evaluation of the upper abdomen is normal.  There is a persistent slightly mottled appearance of the thoracic spine, likely secondary  to patient's obesity. No discrete aggressive osseous abnormalities. Normal appearance of the thyroid gland.  IMPRESSION: 1. The examination is positive for pulmonary embolism involving multiple segmental and subsegmental arteries of all lobes of the left lung. Overall, the clot burden is deemed small. No definitive CT evidence of right-sided heart strain or pulmonary infarction. 2. Overall findings suggestive of worsening metastatic bronchogenic carcinoma with suspected minimal increase in right suprahilar mass and associated mediastinal adenopathy,  now with near complete obstruction of the right middle and lower lobes. Worsening interlobular septal thickening within the residual aerated right upper lobe is nonspecific though could be indicative of lymphangitic spread of tumor. Continued attention on follow-up is recommended. 3. Interval development of small right-sided pleural effusion. 4. Worsening interlobular septal thickening within the contralateral left lung, nonspecific though could be indicative of pulmonary edema, though again, lymphangitic spread of tumor is not excluded. Critical Value/emergent results were called by telephone at the time of interpretation on 03/07/2013 at 2:26 PM to Dr. Sharilyn Sites , who verbally acknowledged these results.   Electronically Signed   By: Simonne Come M.D.   On: 03/07/2013 14:32   Dg Chest Port 1 View  03/09/2013   CLINICAL DATA:  Atelectasis.  EXAM: PORTABLE CHEST - 1 VIEW  COMPARISON:  03/08/2013.  FINDINGS: Persistent atelectasis of the right lung is noted. Underlying pleural effusion cannot be excluded. There is shift of the mediastinum and heart from left to right. Left lung is clear. Cardiac silhouette cannot be evaluated. Pulmonary vascularity is normal. No acute osseous abnormality.  IMPRESSION: Persistent atelectasis right lung. Underlying pleural effusion cannot be excluded.   Electronically Signed   By: Maisie Fus  Register   On: 03/09/2013 06:49   Dg Chest Port 1 View  03/08/2013   CLINICAL DATA:  Atelectasis  EXAM: PORTABLE CHEST - 1 VIEW  COMPARISON:  Prior chest x-ray and CT PE study 03/07/2013  FINDINGS: Interval development of whiteout of the right hemi thorax with left-to-right shift of the cardiac and mediastinal structures. There is abrupt cut off of the right main stem bronchus. The left lung remains well it simply well aerated although there is diffuse interstitial prominence which remains unchanged compared to the recent prior imaging. No acute osseous abnormality. No pneumothorax.   IMPRESSION: Interval obstruction of the right main stem bronchus resulting in complete atelectasis of the right lung. Resultant volume loss with left-to-right shift of the cardiac and mediastinal structures.  Given the rapid interval change, mucous plugging in the narrowed right main stem bronchus is the most likely etiology.  These results were called by telephone at the time of interpretation on 03/08/2013 at 8:15 AM to Dr. Delford Field, who verbally acknowledged these results.   Electronically Signed   By: Malachy Moan M.D.   On: 03/08/2013 08:15   Dg Chest Port 1 View  03/07/2013   CLINICAL DATA:  Shortness of breath and difficulty breathing  EXAM: PORTABLE CHEST - 1 VIEW  COMPARISON:  02/18/2013  FINDINGS: There is again noted fullness in the hilum and right suprahilar at/ peritracheal region consistent with the given history of centrally obstructing mass lesion. Increasing consolidation is noted in the right lower lobe. The left lung is clear.  IMPRESSION: Persistent changes consistent with the given clinical history. There is increasing right basilar consolidation identified likely related to post obstructive changes.   Electronically Signed   By: Alcide Clever M.D.   On: 03/07/2013 12:41     ZOX:WRUEA collapse R lung persists 12/31  ASSESSMENT / PLAN:  PULMONARY A: Acute hypoxic resp failure Total collapse R lung d/t CA and mucus plugging Progressive stage IIIB (T2b., N3, M0) non-small cell lung cancer causing endobronchial obstruction, CT s/o lymphangitic spread and Acute PE -no RV strain on CT but pos trop ? Underlying COPd, remote smoker P:   Fob 12/31 May need intubation  CARDIOVASCULAR A: Htn P:  Hold BP meds Poor iv access>>3L picc  RENAL A: no renal issues P:   Hydrate to continue  GASTROINTESTINAL A:  No issues P:   Npo for fob  HEMATOLOGIC/ONC A: Lung CA P:  Monitor hb,plts on heparin chemorx on hold To cont XRT  INFECTIOUS A:  RLL post obs pna P:   cont  Cefepime/vanc empiric  ENDOCRINE A:  No issues   P:   CBgs while npo  NEUROLOGIC A:  No issues P:   MRI was planned to complete staging, defer for now  TODAY'S SUMMARY: Total collapse R lung, Lung CA, PE.  Plan FOB today 12/31 ,  XRT , hold chemorx. Keep in icu.   I have personally obtained a history, examined the patient, evaluated laboratory and imaging results, formulated the assessment and plan and placed orders. CRITICAL CARE: The patient is critically ill with multiple organ systems failure and requires high complexity decision making for assessment and support, frequent evaluation and titration of therapies, application of advanced monitoring technologies and extensive interpretation of multiple databases. Critical Care Time devoted to patient care services described in this note is 45 minutes.   Shan Levans MD Beeper  [phone]  Cell  [phone]  If no response or cell goes to voicemail, call beeper [phone]  Pulmonary and Critical Care Medicine [facility] Pager: [phone]  03/09/2013, 7:57 AM

## 2013-03-09 NOTE — Progress Notes (Signed)
ANTICOAGULATION CONSULT NOTE - Follow Up Consult  Pharmacy Consult for Heparin Indication: chest pain/ACS and pulmonary embolus + Right lower DVT   No Known Allergies  Patient Measurements: Height: 5\' 5"  (165.1 cm) Weight: 213 lb 6.5 oz (96.8 kg) IBW/kg (Calculated) : 57 Heparin Dosing Weight:   Vital Signs: Temp: 98 F (36.7 C) (12/31 0000) Temp src: Oral (12/31 0000) BP: 147/80 mmHg (12/31 0100) Pulse Rate: 115 (12/31 0100)  Labs:  Recent Labs  03/07/13 1210  03/08/13 0354 03/08/13 0800 03/08/13 1616 03/08/13 2255  HGB 13.7  --  13.5  --   --   --   HCT 41.8  --  41.2  --   --   --   PLT 242  --  262  --   --   --   HEPARINUNFRC  --   < >  --  0.21* 0.44 0.44  CREATININE 0.88  --  0.87  --   --   --   TROPONINI 0.35*  --   --   --   --   --   < > = values in this interval not displayed.  Estimated Creatinine Clearance: 78.1 ml/min (by C-G formula based on Cr of 0.87).   Medications:  Infusions:  . heparin 1,600 Units/hr (03/09/13 0037)    Assessment: Patient with heparin level at goal for second time.  No issues per RN.  Goal of Therapy:  Heparin level 0.3-0.7 units/ml Monitor platelets by anticoagulation protocol: Yes   Plan:  Continue heparin at current rate, recheck level daily  Darlina Guys, Jacquenette Shone Crowford 03/09/2013,1:50 AM

## 2013-03-10 LAB — HEPARIN LEVEL (UNFRACTIONATED): Heparin Unfractionated: 0.31 IU/mL (ref 0.30–0.70)

## 2013-03-10 LAB — CBC
HCT: 40.3 % (ref 36.0–46.0)
Hemoglobin: 12.5 g/dL (ref 12.0–15.0)
MCH: 27.5 pg (ref 26.0–34.0)
MCHC: 31 g/dL (ref 30.0–36.0)
MCV: 88.6 fL (ref 78.0–100.0)
Platelets: 275 10*3/uL (ref 150–400)
RBC: 4.55 MIL/uL (ref 3.87–5.11)
RDW: 14.7 % (ref 11.5–15.5)
WBC: 20.3 10*3/uL — ABNORMAL HIGH (ref 4.0–10.5)

## 2013-03-10 MED ORDER — SODIUM CHLORIDE 0.9 % IJ SOLN
10.0000 mL | INTRAMUSCULAR | Status: DC | PRN
  Administered 2013-03-12: 20 mL

## 2013-03-10 MED ORDER — HEPARIN (PORCINE) IN NACL 100-0.45 UNIT/ML-% IJ SOLN
1700.0000 [IU]/h | INTRAMUSCULAR | Status: DC
  Administered 2013-03-10 – 2013-03-11 (×2): 1700 [IU]/h via INTRAVENOUS
  Filled 2013-03-10 (×4): qty 250

## 2013-03-10 MED ORDER — VITAMINS A & D EX OINT
TOPICAL_OINTMENT | CUTANEOUS | Status: AC
  Administered 2013-03-10: 02:00:00
  Filled 2013-03-10: qty 5

## 2013-03-10 MED ORDER — DOCUSATE SODIUM 100 MG PO CAPS
200.0000 mg | ORAL_CAPSULE | Freq: Two times a day (BID) | ORAL | Status: DC
  Administered 2013-03-10 – 2013-03-15 (×8): 200 mg via ORAL
  Filled 2013-03-10 (×13): qty 2

## 2013-03-10 MED ORDER — SODIUM CHLORIDE 0.9 % IJ SOLN
10.0000 mL | Freq: Two times a day (BID) | INTRAMUSCULAR | Status: DC
  Administered 2013-03-10 – 2013-03-13 (×7): 10 mL
  Administered 2013-03-14: 20 mL
  Administered 2013-03-14: 10 mL
  Administered 2013-03-15 – 2013-03-16 (×3): 20 mL

## 2013-03-10 NOTE — Progress Notes (Signed)
ANTICOAGULATION CONSULT NOTE - Follow Up Consult  Pharmacy Consult for Heparin Indication: chest pain/ACS, pulmonary embolus and DVT  No Known Allergies  Patient Measurements: Height: 5\' 5"  (165.1 cm) Weight: 208 lb 12.4 oz (94.7 kg) IBW/kg (Calculated) : 57 Heparin Dosing Weight: 79 kg  Vital Signs: Temp: 97.8 F (36.6 C) (01/01 0000) Temp src: Oral (01/01 0000) BP: 106/53 mmHg (01/01 0700) Pulse Rate: 76 (01/01 0700)  Labs:  Recent Labs  03/07/13 1210  03/08/13 0354  03/08/13 2255 03/09/13 0359 03/10/13 0331  HGB 13.7  --  13.5  --   --  13.1 12.5  HCT 41.8  --  41.2  --   --  41.2 40.3  PLT 242  --  262  --   --  285 275  HEPARINUNFRC  --   < >  --   < > 0.44 0.34 0.31  CREATININE 0.88  --  0.87  --   --  0.87  --   TROPONINI 0.35*  --   --   --   --   --   --   < > = values in this interval not displayed.  Estimated Creatinine Clearance: 77.3 ml/min (by C-G formula based on Cr of 0.87).   Medications:  Infusions:  . heparin 1,600 Units/hr (03/09/13 1600)    Assessment: 62 yoF with PMH significant for newly diagnosed right bronchogenic mass (diagnosed 02/18/13 by CT scan) presents with SOB with exertion s/p radiation treatment today.  Patient with r/o ACS, +PE and +DVT.  Pharmacy consulted to dose IV heparin starting 12/29.  CBC: Hgb and Plt WNL and stable  Heparin level remains therapeutic at 0.31 this AM but trending down last 3 days (and at low end therapeutic).  RN confirms no interruptions or complications.  Goal of Therapy:  Heparin level 0.3-0.7 units/ml Monitor platelets by anticoagulation protocol: Yes   Plan:   For decreasing heparin level, increase heparin IV infusion at 1700 units/hr  Daily heparin level and CBC  Continue to monitor H&H and platelets  Follow up long-term plan for anticoagulation (per oncology note 12/30, change to SQ enoxaparin or PO xarelto prior to discharge)  Doreene Eland, PharmD, BCPS.   Pager:  [phone] 03/10/2013 9:03 AM

## 2013-03-10 NOTE — Progress Notes (Signed)
Peripherally Inserted Central Catheter/Midline Placement  The IV Nurse has discussed with the patient and/or persons authorized to consent for the patient, the purpose of this procedure and the potential benefits and risks involved with this procedure.  The benefits include less needle sticks, lab draws from the catheter and patient may be discharged home with the catheter.  Risks include, but not limited to, infection, bleeding, blood clot (thrombus formation), and puncture of an artery; nerve damage and irregular heat beat.  Alternatives to this procedure were also discussed.  PICC/Midline Placement Documentation        Henderson [place] 03/10/2013, 8:50 AM

## 2013-03-10 NOTE — Progress Notes (Signed)
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 Started on RT (Kinard) with goal of starting chemo 12/30 (mohammed) presented with worsening sob & hypoxic resp failure, CT scan showing progressive RML/RLL atelectasis & multiple left-sided  segmental and subsegmental PE   SIGNIFICANT EVENTS / STUDIES:  CT chest 12/29 >>pulmonary embolism involving  multiple segmental and subsegmental arteries of all lobes of the  left lung. Overall, the clot burden is small, no strain worsening metastatic bronchogenic  carcinoma with suspected minimal increase in right suprahilar mass  and associated mediastinal adenopathy, now with near complete  obstruction of the right middle and lower lobes. Worsening interlobular septal thickening within the residual aerated right upper lobe is nonspecific though could be indicative of lymphangitic spread of tumor. small right-sided pleural effusion.  Worsening interlobular septal thickening within the contralateral left lung FOB 12/31 >> R mainstem occlusion w CA   LINES / TUBES: PIV  CULTURES: none  ANTIBIOTICS: 12/30cefepime >> 12/30 vanc >>  SUBJECTIVE:  S/p FOB 12/31 Both breathing and O2 needs are improved today Still coughing  VITAL SIGNS: Temp:  [97.3 F (36.3 C)-97.8 F (36.6 C)] 97.8 F (36.6 C) (01/01 0000) Pulse Rate:  [76-115] 76 (01/01 0700) Resp:  [16-26] 17 (01/01 0700) BP: (106-161)/(48-91) 106/53 mmHg (01/01 0700) SpO2:  [88 %-97 %] 97 % (01/01 0700) FiO2 (%):  [70 %] 70 % (12/31 1359) Weight:  [94.7 kg (208 lb 12.4 oz)] 94.7 kg (208 lb  12.4 oz) (12/31 1000) HEMODYNAMICS: CV stable   VENTILATOR SETTINGS: Vent Mode:  [-]  FiO2 (%):  [70 %] 70 % VM INTAKE / OUTPUT: Intake/Output     12/31 0701 - 01/01 0700 01/01 0701 - 01/02 0700   P.O. 960    I.V. (mL/kg) 614 (6.5)    IV Piggyback 650    Total Intake(mL/kg) 2224 (23.5)    Urine (mL/kg/hr) 600 (0.3)    Total Output 600     Net +1624          Urine Occurrence 2 x      PHYSICAL EXAMINATION: Gen. Pleasant, well-nourished ENT - no lesions, no post nasal drip Neck: No JVD, no thyromegaly, no carotid bruits Lungs:decreased BS on R  Cardiovascular: Rhythm regular, heart sounds  normal, no murmurs, no peripheral edema Abdomen: soft and non-tender, no hepatosplenomegaly, BS normal. Musculoskeletal: No deformities, no cyanosis or clubbing Neuro:  alert, non focal Skin:  Warm, no lesions/ rash   LABS:  CBC  Recent Labs Lab 03/08/13 0354 03/09/13 0359 03/10/13 0331  WBC 10.8* 15.9* 20.3*  HGB 13.5 13.1 12.5  HCT 41.2 41.2 40.3  PLT 262 285 275   Coag's No results found for this basename: APTT, INR,  in the last 168 hours BMET  Recent Labs Lab 03/07/13 1210 03/08/13 0354 03/09/13 0359  NA 130* 134* 133*  K 4.5 4.5 4.4  CL 95* 98 98  CO2 22 18* 18*  BUN 8 8 14   CREATININE 0.88 0.87 0.87  GLUCOSE 108* 114* 193*   Electrolytes  Recent Labs Lab 03/07/13 1210 03/08/13 0354 03/09/13 0359  CALCIUM 10.1 9.6  9.9  MG  --  2.2  --   PHOS  --  4.6  --    Sepsis Markers No results found for this basename: LATICACIDVEN, PROCALCITON, O2SATVEN,  in the last 168 hours ABG  Recent Labs Lab 03/07/13 1445  PHART 7.465*  PCO2ART 29.2*  PO2ART 59.7*   Liver Enzymes  Recent Labs Lab 03/07/13 1210  AST 45*  ALT 89*  ALKPHOS 150*  BILITOT 0.8  ALBUMIN 3.1*   Cardiac Enzymes  Recent Labs Lab 03/07/13 1210  TROPONINI 0.35*  PROBNP 933.4*   Glucose No results found for this basename: GLUCAP,  in the last 168 hours  Imaging Dg Chest  Port 1 View  03/09/2013   CLINICAL DATA:  Atelectasis.  EXAM: PORTABLE CHEST - 1 VIEW  COMPARISON:  03/08/2013.  FINDINGS: Persistent atelectasis of the right lung is noted. Underlying pleural effusion cannot be excluded. There is shift of the mediastinum and heart from left to right. Left lung is clear. Cardiac silhouette cannot be evaluated. Pulmonary vascularity is normal. No acute osseous abnormality.  IMPRESSION: Persistent atelectasis right lung. Underlying pleural effusion cannot be excluded.   Electronically Signed   By: Marcello Moores  Register   On: 03/09/2013 06:49     UEK:CMKLK collapse R lung persists 12/31  ASSESSMENT / PLAN:  PULMONARY A: Acute hypoxic resp failure Total collapse R lung d/t CA and mucus plugging Progressive stage IIIB (T2b., N3, M0) non-small cell lung cancer causing endobronchial obstruction, CT s/o lymphangitic spread and Acute PE -no RV strain on CT but pos trop ? Underlying COPD, remote smoker P:   Follow CXR Continue empiric abx per ID section for possible post-obstructive PNA XRT per Drs Pablo Ledger and Kinard  CARDIOVASCULAR A: Htn P:  Hold BP meds Poor iv access>>3L picc  RENAL A: no renal issues P:   Hydrate  GASTROINTESTINAL A:  No issues P:     HEMATOLOGIC/ONC A: Lung CA PE P:  Monitor hb,plts on heparin Chemo rx on hold Cont XRT Will need to discuss w Dr Julien Nordmann plans for outpt anticoagulation > enoxaparin vs coumadin vs xarelto  INFECTIOUS A:  RLL post obs pna P:   cont Cefepime/vanc empiric  ENDOCRINE A:  No issues   P:   CBgs   NEUROLOGIC A:  No issues P:   MRI was planned to complete staging, defer for now  TODAY'S SUMMARY:    I have personally obtained a history, examined the patient, evaluated laboratory and imaging results, formulated the assessment and plan and placed orders.   Baltazar Apo, MD, PhD 03/10/2013, 8:47 AM [facility] [phone] or if no answer [phone]

## 2013-03-11 ENCOUNTER — Encounter (HOSPITAL_COMMUNITY): Payer: BC Managed Care – PPO

## 2013-03-11 ENCOUNTER — Inpatient Hospital Stay: Payer: BC Managed Care – PPO | Admitting: Internal Medicine

## 2013-03-11 ENCOUNTER — Ambulatory Visit
Admission: RE | Admit: 2013-03-11 | Discharge: 2013-03-11 | Disposition: A | Discharge status: Home / Self Care | Payer: BC Managed Care – PPO | Source: Ambulatory Visit | Attending: Radiation Oncology | Admitting: Radiation Oncology

## 2013-03-11 DIAGNOSIS — R918 Other nonspecific abnormal finding of lung field: Secondary | ICD-10-CM

## 2013-03-11 DIAGNOSIS — R222 Localized swelling, mass and lump, trunk: Secondary | ICD-10-CM

## 2013-03-11 LAB — CBC
HCT: 38.1 % (ref 36.0–46.0)
HEMOGLOBIN: 12 g/dL (ref 12.0–15.0)
MCH: 27.6 pg (ref 26.0–34.0)
MCHC: 31.5 g/dL (ref 30.0–36.0)
MCV: 87.6 fL (ref 78.0–100.0)
Platelets: 289 10*3/uL (ref 150–400)
RBC: 4.35 MIL/uL (ref 3.87–5.11)
RDW: 14.5 % (ref 11.5–15.5)
WBC: 15.6 10*3/uL — AB (ref 4.0–10.5)

## 2013-03-11 LAB — CULTURE, RESPIRATORY

## 2013-03-11 LAB — CULTURE, RESPIRATORY W GRAM STAIN

## 2013-03-11 LAB — VANCOMYCIN, TROUGH: VANCOMYCIN TR: 26.8 ug/mL — AB (ref 10.0–20.0)

## 2013-03-11 LAB — HEPARIN LEVEL (UNFRACTIONATED)
HEPARIN UNFRACTIONATED: 0.8 [IU]/mL — AB (ref 0.30–0.70)
Heparin Unfractionated: 0.99 IU/mL — ABNORMAL HIGH (ref 0.30–0.70)
Heparin Unfractionated: 0.56 IU/mL (ref 0.30–0.70)

## 2013-03-11 MED ORDER — VANCOMYCIN HCL IN DEXTROSE 750-5 MG/150ML-% IV SOLN
750.0000 mg | Freq: Two times a day (BID) | INTRAVENOUS | Status: DC
  Administered 2013-03-11 – 2013-03-12 (×2): 750 mg via INTRAVENOUS
  Filled 2013-03-11 (×3): qty 150

## 2013-03-11 MED ORDER — CARVEDILOL 6.25 MG PO TABS
6.2500 mg | ORAL_TABLET | Freq: Two times a day (BID) | ORAL | Status: DC
  Administered 2013-03-11 – 2013-03-16 (×11): 6.25 mg via ORAL
  Filled 2013-03-11 (×13): qty 1

## 2013-03-11 MED ORDER — HEPARIN (PORCINE) IN NACL 100-0.45 UNIT/ML-% IJ SOLN
1400.0000 [IU]/h | INTRAMUSCULAR | Status: DC
  Filled 2013-03-11 (×2): qty 250

## 2013-03-11 MED ORDER — HEPARIN (PORCINE) IN NACL 100-0.45 UNIT/ML-% IJ SOLN
1200.0000 [IU]/h | INTRAMUSCULAR | Status: DC
  Filled 2013-03-11 (×2): qty 250

## 2013-03-11 NOTE — Progress Notes (Signed)
CARE MANAGEMENT NOTE 03/11/2013  Patient:  Meagan Garcia, Meagan Garcia   Account Number:  [id number]  Date Initiated:  03/11/2013  Documentation initiated by:  DAVIS,RHONDA  Subjective/Objective Assessment:   pt with atelectasis of the complete rt lung from tumor and p.e. /on 4l o2 via [place] iv chemo started on [id number] and R,T. ongoing     Action/Plan:   home when p.e. and lung mass stable and obstructed to minimal.   Anticipated DC Date:  03/14/2013   Anticipated DC Plan:  HOME/SELF CARE  In-house referral  NA      DC Planning Services  NA      PAC Choice  NA   Choice offered to / List presented to:  NA   DME arranged  NA      DME agency  NA     [place] arranged  NA      [place] agency  NA   Status of service:  In process, will continue to follow Medicare Important Message given?  NA - LOS <3 / Initial given by admissions (If response is "NO", the following Medicare IM given date fields will be blank) Date Medicare IM given:   Date Additional Medicare IM given:    Discharge Disposition:    Per UR Regulation:  Reviewed for med. necessity/level of care/duration of stay  If discussed at [facility] of Stay Meetings, dates discussed:    Comments:  [id number]/Rhonda Eldridge Dace, BSN, [place] [phone] Chart Reviewed for discharge and hospital needs. Discharge needs at time of review:  None present will follow for needs. Review of patient progress due on [id number].

## 2013-03-11 NOTE — Progress Notes (Signed)
ANTICOAGULATION CONSULT NOTE - Follow Up Consult  Pharmacy Consult for Heparin Indication: chest pain/ACS, pulmonary embolus and DVT  No Known Allergies  Patient Measurements: Height: 5\' 5"  (165.1 cm) Weight: 215 lb 9.8 oz (97.8 kg) IBW/kg (Calculated) : 57 Heparin Dosing Weight: 79 kg  Vital Signs: Temp: 97.6 F (36.4 C) (01/02 1200) Temp src: Oral (01/02 1200) BP: 125/82 mmHg (01/02 1039) Pulse Rate: 111 (01/02 0900)  Labs:  Recent Labs  03/09/13 0359 03/10/13 0331 03/11/13 0347 03/11/13 1228  HGB 13.1 12.5 12.0  --   HCT 41.2 40.3 38.1  --   PLT 285 275 289  --   HEPARINUNFRC 0.34 0.31 0.80* 0.99*  CREATININE 0.87  --   --   --     Estimated Creatinine Clearance: 78.6 ml/min (by C-G formula based on Cr of 0.87).   Medications:  Infusions:  . heparin 1,400 Units/hr (03/11/13 0636)    Assessment: 78 yoF with PMH significant for newly diagnosed right bronchogenic mass (diagnosed 02/18/13 by CT scan) presents with SOB with exertion s/p radiation treatment today.  Patient with r/o ACS, +PE and +DVT.  Pharmacy consulted to dose IV heparin starting 12/29.  CBC: Hgb and Plt WNL and stable  Heparin level 0.99 with infusion at 1400 units/hr  RN confirms no interruptions or complications.  Goal of Therapy:  Heparin level 0.3-0.7 units/ml Monitor platelets by anticoagulation protocol: Yes   Plan:   Reduce heparin to 1200 units/hr (12 mL/hr).  Check heparin level at 1830.  Daily heparin level and CBC  Continue to monitor H&H and platelets  Follow up long-term plan for anticoagulation (per oncology note 12/30, change to SQ enoxaparin or PO xarelto prior to discharge)  Hershal Coria, PharmD, BCPS Pager: [phone] 03/11/2013 1:03 PM

## 2013-03-11 NOTE — Progress Notes (Signed)
PHARMACY BRIEF NOTE:  HEPARIN  Indication: PE, DVT, r/o ACS  Heparin Level 0.8 on 1700 units/hr  Hgb 12.0 Hct 38.1 Pltc 289K  RN confirms infusion rate, states no bleeding noted..  Assessment: Heparin level supratherapeutic  Goals: Heparin level 0.3 - 0.7 units/mL Monitor platelets by anticoagulation protocol: Yes   Plan: 1. Reduce heparin to 1400 units/hr 2. Recheck heparin level at 12:30 pm 3. Full note to follow thereafter  Clayburn Pert, PharmD, BCPS Pager: [phone] 03/11/2013  6:05 AM

## 2013-03-11 NOTE — Progress Notes (Signed)
CRITICAL VALUE ALERT  Critical value received:  Vancomycin level 26.8  Date of notification:  03/11/13  Time of notification:  1000  Critical value read back:yes  Nurse who received alert:  Clarene Duke RN  MD notified (1st page):  Pharm D  Time of first page:  1000  MD notified (2nd page):  Time of second page:  Responding MD:  Vena Austria  Time MD responded:  1000

## 2013-03-11 NOTE — Progress Notes (Signed)
Pt placed on room air and then ambulated 69feet  In hallway using a standard walker. Mild increased WOB noted during slow walk while O2Sats maintained at 95-98%. Ivylynn Hoppes, Beverly Gust, RN

## 2013-03-11 NOTE — Progress Notes (Signed)
ANTICOAGULATION CONSULT NOTE - Follow Up Consult  Pharmacy Consult for Heparin Indication: chest pain/ACS, pulmonary embolus and DVT  No Known Allergies  Patient Measurements: Height: 5\' 5"  (165.1 cm) Weight: 215 lb 9.8 oz (97.8 kg) IBW/kg (Calculated) : 57 Heparin Dosing Weight: 79 kg  Vital Signs: Temp: 97.9 F (36.6 C) (01/02 1600) Temp src: Oral (01/02 1600) BP: 138/67 mmHg (01/02 1800) Pulse Rate: 97 (01/02 1900)  Labs:  Recent Labs  03/09/13 0359 03/10/13 0331 03/11/13 0347 03/11/13 1228 03/11/13 1905  HGB 13.1 12.5 12.0  --   --   HCT 41.2 40.3 38.1  --   --   PLT 285 275 289  --   --   HEPARINUNFRC 0.34 0.31 0.80* 0.99* 0.56  CREATININE 0.87  --   --   --   --     Estimated Creatinine Clearance: 78.6 ml/min (by C-G formula based on Cr of 0.87).   Medications:  Infusions:  . heparin 1,200 Units/hr (03/11/13 1313)    Assessment: 11 yoF with PMH significant for newly diagnosed right bronchogenic mass (diagnosed 02/18/13 by CT scan) presents with SOB with exertion s/p radiation treatment today.  Patient with r/o ACS, +PE and +DVT.  Pharmacy consulted to dose IV heparin starting 12/29.  Heparin level in therapeutic range with infusion at 1200 units/hr. No bleeding reported/documented.  Goal of Therapy:  Heparin level 0.3-0.7 units/ml Monitor platelets by anticoagulation protocol: Yes   Plan:   Cont heparin at 1200 units/hr(12 mL/hr).  F/u daily heparin level and CBC.  Continue to monitor H&H and platelets.  Follow up long-term plan for anticoagulation (per oncology note 12/30, change to SQ enoxaparin or PO xarelto prior to discharge)  Romeo Rabon, PharmD, pager [phone]. 03/11/2013,7:55 PM.

## 2013-03-11 NOTE — Progress Notes (Signed)
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 Started on RT (Kinard) with goal of starting chemo 12/30 (mohammed) presented with worsening sob & hypoxic resp failure, CT scan showing progressive RML/RLL atelectasis & multiple left-sided  segmental and subsegmental PE  SIGNIFICANT EVENTS / STUDIES:  CT chest 12/29 >>pulmonary embolism involving  multiple segmental and subsegmental arteries of all lobes of the  left lung. Overall, the clot burden is small, no strain worsening metastatic bronchogenic  carcinoma with suspected minimal increase in right suprahilar mass  and associated mediastinal adenopathy, now with near complete  obstruction of the right middle and lower lobes. Worsening interlobular septal thickening within the residual aerated right upper lobe is nonspecific though could be indicative of lymphangitic spread of tumor. small right-sided pleural effusion.  Worsening interlobular septal thickening within the contralateral left lung FOB 12/31 >> R mainstem occlusion w CA  LINES / TUBES: PIV  CULTURES: none  ANTIBIOTICS: 12/30cefepime >> 12/30 vanc >>  SUBJECTIVE:  S/p FOB 12/31 Feels much better this AM from a respiratory standpoint. Going to radiation now.  VITAL SIGNS: Temp:  [97.1 F (36.2 C)-98 F (36.7 C)] 98 F (36.7 C) (01/02 0000) Pulse Rate:  [82-102] 89 (01/02 0800) Resp:  [13-33] 33 (01/02 0800) BP: (107-159)/(39-78) 131/58 mmHg (01/02 0800) SpO2:  [91 %-99 %] 94 % (01/02 0827) Weight:  [97.8 kg (215 lb 9.8 oz)] 97.8 kg  (215 lb 9.8 oz) (01/02 0600) HEMODYNAMICS: CV stable   VENTILATOR SETTINGS:   VM INTAKE / OUTPUT: Intake/Output     01/01 0701 - 01/02 0700 01/02 0701 - 01/03 0700   P.O. 600    I.V. (mL/kg) 844.1 (8.6)    IV Piggyback 650    Total Intake(mL/kg) 2094.1 (21.4)    Urine (mL/kg/hr) 825 (0.4)    Total Output 825     Net +1269.1          Urine Occurrence 3 x    Stool Occurrence 2 x      PHYSICAL EXAMINATION: Gen. Pleasant, well-nourished ENT - no lesions, no post nasal drip Neck: No JVD, no thyromegaly, no carotid bruits Lungs: Decreased BS on the right, CTA on left. Cardiovascular: Rhythm regular, heart sounds  normal, no murmurs, no peripheral edema Abdomen: soft and non-tender, no hepatosplenomegaly, BS normal. Musculoskeletal: No deformities, no cyanosis or clubbing Neuro:  alert, non focal Skin:  Warm, no lesions/ rash   LABS:  CBC  Recent Labs Lab 03/09/13 0359 03/10/13 0331 03/11/13 0347  WBC 15.9* 20.3* 15.6*  HGB 13.1 12.5 12.0  HCT 41.2 40.3 38.1  PLT 285 275 289   Coag's No results found for this basename: APTT, INR,  in the last 168 hours BMET  Recent Labs Lab 03/07/13 1210 03/08/13 0354 03/09/13 0359  NA 130* 134* 133*  K 4.5 4.5 4.4  CL 95* 98 98  CO2 22 18* 18*  BUN 8 8 14   CREATININE 0.88 0.87 0.87  GLUCOSE 108* 114* 193*   Electrolytes  Recent Labs Lab 03/07/13 1210 03/08/13 0354 03/09/13 0359  CALCIUM 10.1 9.6 9.9  MG  --  2.2  --   PHOS  --  4.6  --    Sepsis Markers No results found for this basename: LATICACIDVEN, PROCALCITON, O2SATVEN,  in the last 168 hours ABG  Recent Labs Lab 03/07/13 1445  PHART 7.465*  PCO2ART 29.2*  PO2ART 59.7*   Liver Enzymes  Recent Labs Lab 03/07/13 1210  AST 45*  ALT 89*  ALKPHOS 150*  BILITOT 0.8  ALBUMIN 3.1*   Cardiac Enzymes  Recent Labs Lab 03/07/13 1210  TROPONINI 0.35*  PROBNP 933.4*   Glucose No results found for this basename: GLUCAP,  in the last 168  hours  Imaging No results found.   WKG:SUPJS collapse R lung persists 12/31  ASSESSMENT / PLAN:  PULMONARY A: Acute hypoxic resp failure Total collapse R lung d/t CA and mucus plugging Progressive stage IIIB (T2b., N3, M0) non-small cell lung cancer causing endobronchial obstruction, CT s/o lymphangitic spread and Acute PE -no RV strain on CT but pos trop ? Underlying COPD, remote smoker P:   - Continue empiric abx per ID section for possible post-obstructive PNA. - Going for radiation therapy today. - Order ambulatory desaturation study and will arrange for home O2.  CARDIOVASCULAR A: Htn P:  - Hold BP meds. - Poor iv access>>3L picc, will likely need port-a-cath.  RENAL A: no renal issues P:   - KVO IVF at this point, patient is eating.  GASTROINTESTINAL A:  No issues P:   - Regular diet.  HEMATOLOGIC/ONC A: Lung CA PE P:  - Monitor hb,plts on heparin. - Chemo rx on hold. - Cont XRT. - Will need to discuss w Dr Julien Nordmann plans for outpt anticoagulation > enoxaparin vs coumadin vs xarelto due to small PE.  INFECTIOUS A:  RLL post obs pna P:   - Cont Cefepime/vanc empiric until able to open right lung.  ENDOCRINE A:  No issues   P:   - CBGs as ordered.  NEUROLOGIC A:  No issues P:   - MRI was planned to complete staging, defer for now.  TODAY'S SUMMARY: O2 sats much more improved this AM but still needing 4L [place].  Desats with any movement, will need home O2.  Will arrange and transfer to SDU.  Going to RT and will defer to H/O to determine kind of anticoagulation prior to discharge.  I have personally obtained a history, examined the patient, evaluated laboratory and imaging results, formulated the assessment and plan and placed orders.  Rush Farmer, M.D. [facility] Pulmonary/Critical Care Medicine. Pager: [phone]. After hours pager: [phone].

## 2013-03-11 NOTE — Progress Notes (Signed)
ANTIBIOTIC CONSULT NOTE - FOLLOW UP  Pharmacy Consult for Vancomycin Indication: pneumonia  No Known Allergies  Patient Measurements: Height: 5\' 5"  (165.1 cm) Weight: 215 lb 9.8 oz (97.8 kg) IBW/kg (Calculated) : 57  Vital Signs: Temp: 98 F (36.7 C) (01/02 0000) Temp src: Oral (01/02 0000) BP: 125/82 mmHg (01/02 1039) Pulse Rate: 89 (01/02 0800) Intake/Output from previous day: 01/01 0701 - 01/02 0700 In: 2094.1 [P.O.:600; I.V.:844.1; IV Piggyback:650] Out: 825 [Urine:825] Intake/Output from this shift:    Labs:  Recent Labs  03/09/13 0359 03/10/13 0331 03/11/13 0347  WBC 15.9* 20.3* 15.6*  HGB 13.1 12.5 12.0  PLT 285 275 289  CREATININE 0.87  --   --    Estimated Creatinine Clearance: 78.6 ml/min (by C-G formula based on Cr of 0.87).  Recent Labs  03/11/13 0925  VANCOTROUGH 26.8*     Microbiology: 12/31 bronchial washings culture: non-pathogenic oropharyngeal-type flora isolated  Assessment: 69 yoF with PMH significant for newly diagnosed right bronchogenic mass (diagnosed 02/18/13 by CT scan) presents with SOB with exertion s/p radiation treatment. She is found to have PE and DVT. Pharmacy is consulted to dose IV cefepime and vancomycin for post obstructive pneumonia.   Day #4 Cefepime 1g IV q8h and Vancomycin 1250 mg IV q12h for post-obstructive pneumonia.  WBC 15.6, improved (on steroids)  Renal function stable. SCr 0.87, CrCl~ 76 ml/min/1/7m2 (normalized)  Vancomycin trough supratherapeutic at 26.8  Goal of Therapy:  Vancomycin trough level 15-20 mcg/ml  Plan:  Reduce vancomycin to 750 mg IV q12h.    Hershal Coria 03/11/2013,10:40 AM

## 2013-03-11 NOTE — Progress Notes (Addendum)
Clinical Social Work Department BRIEF PSYCHOSOCIAL ASSESSMENT 03/11/2013  Patient:  Meagan Garcia, Meagan Garcia     Account Number:  [id number]     Admit date:  03/07/2013  Clinical Social Worker:  Ulyess Blossom  Date/Time:  03/11/2013 03:30 PM  Referred by:  Physician  Date Referred:  03/11/2013 Referred for  Other - See comment   Other Referral:   Requesting assistance with disability paperwork   Interview type:  Patient Other interview type:    PSYCHOSOCIAL DATA Living Status:  HUSBAND Admitted from facility:   Level of care:   Primary support name:  Deidre Ala Kilmartin/husband/[phone] Primary support relationship to patient:  SPOUSE Degree of support available:   adequate    CURRENT CONCERNS Current Concerns  Other - See comment   Other Concerns:   Assistance with disability paperwork    SOCIAL WORK ASSESSMENT / PLAN CSW received referral that pt requesting assistance with disability paperwork.    CSW met with pt at bedside. Pt son and pt daughter-in-law present at this time. Pt discussed that she was recently diagnosed with lung cancer and would like to pursue applying for disability. CSW inquired if pt had disability paperwork yet and pt discussed that she had not yet gotten disability paperwork. CSW discussed that pt would need to get disability paperwork from [employer] and CSW provided packet of information regarding [place] including address for Psychologist, prison and probation services. CSW discussed that CSW can make referral to [facility] CSW in order for [facility] CSW to follow up with pt in the outpatient setting to clarify any questions once pt obtains disability paperwork. CSW also provided pt with [place], [place] name and contact information.    Pt also discussed that pt husband was getting paperwork from [facility] in order for pt to apply for handicap sticker and pt inquired about who would assist with completing this paperwork. CSW discussed  that pt would need to provide paperwork to pt oncologist RN who can assist with completion of paperwork and have MD sign needed paperwork. Pt expressed understanding.    CSW contacted [place] who stated she had met with pt following initial diagnosis and will continue to see pt in outpatient setting for support and assistance with questions regarding disability paperwork and any other needs that arise.    Pt appreciative of information and support.    No further social work needs identified at this time.    CSW signing off.   Assessment/plan status:  No Further Intervention Required Other assessment/ plan:   Information/referral to community resources:   Social Security Disability packet and contact information for Social Security Office and [place] CSW, [employer]    PATIENT'S/FAMILY'S RESPONSE TO PLAN OF CARE: Pt alert and oriented x 4. Pt appears to be coping well at this time with diagnosis and treatment plan and stated that the information provided assisted in alleviating her questions re: disability, etc. Pt had multiple family members in room and appears to have strong support system.     Drake Leach, MSW, [place] Social Work [phone]

## 2013-03-11 NOTE — Progress Notes (Signed)
CSW received referral that pt requesting assistance with disability paperwork and pt may need HH O2  CSW visited pt room to discuss disability paperwork and pt ambulating on walker with RNs at this time.   If pt needs HH services including HH O2 then RNCM would assist with arranging this. CSW notified RNCM of potential need.  CSW to follow up at later time and complete full psychosocial assessment at that time.   Drake Leach, MSW, [place] Social Work [phone]

## 2013-03-11 NOTE — Progress Notes (Signed)
[facility] Therapy Treatment Record Phone [phone]   Radiation Therapy was administered to Meagan Garcia on: 03/11/2013  9:59 AM and was treatment # 6 out of a planned course of 35 treatments.

## 2013-03-12 ENCOUNTER — Encounter (HOSPITAL_COMMUNITY): Payer: Self-pay | Admitting: Pulmonary Disease

## 2013-03-12 DIAGNOSIS — I10 Essential (primary) hypertension: Secondary | ICD-10-CM

## 2013-03-12 DIAGNOSIS — F3289 Other specified depressive episodes: Secondary | ICD-10-CM

## 2013-03-12 DIAGNOSIS — F411 Generalized anxiety disorder: Secondary | ICD-10-CM

## 2013-03-12 DIAGNOSIS — F329 Major depressive disorder, single episode, unspecified: Secondary | ICD-10-CM

## 2013-03-12 LAB — BASIC METABOLIC PANEL
BUN: 21 mg/dL (ref 6–23)
CO2: 22 mEq/L (ref 19–32)
Calcium: 9.3 mg/dL (ref 8.4–10.5)
Chloride: 102 mEq/L (ref 96–112)
Creatinine, Ser: 0.73 mg/dL (ref 0.50–1.10)
GFR calc Af Amer: >90 mL/min (ref >90)
Glucose, Bld: 168 mg/dL — ABNORMAL HIGH (ref 70–99)
Potassium: 4.7 mEq/L (ref 3.7–5.3)
Sodium: 135 mEq/L — ABNORMAL LOW (ref 137–147)

## 2013-03-12 LAB — CBC
HCT: 38.3 % (ref 36.0–46.0)
Hemoglobin: 12 g/dL (ref 12.0–15.0)
MCH: 27.5 pg (ref 26.0–34.0)
MCHC: 31.3 g/dL (ref 30.0–36.0)
MCV: 87.8 fL (ref 78.0–100.0)
PLATELETS: 281 10*3/uL (ref 150–400)
RBC: 4.36 MIL/uL (ref 3.87–5.11)
RDW: 14.7 % (ref 11.5–15.5)
WBC: 13.1 10*3/uL — ABNORMAL HIGH (ref 4.0–10.5)

## 2013-03-12 LAB — PHOSPHORUS: Phosphorus: 2.4 mg/dL (ref 2.3–4.6)

## 2013-03-12 LAB — MAGNESIUM: Magnesium: 2.3 mg/dL (ref 1.5–2.5)

## 2013-03-12 LAB — HEPARIN LEVEL (UNFRACTIONATED): Heparin Unfractionated: 0.39 IU/mL (ref 0.30–0.70)

## 2013-03-12 MED ORDER — BUPROPION HCL ER (SR) 150 MG PO TB12
150.0000 mg | ORAL_TABLET | Freq: Every day | ORAL | Status: DC
  Administered 2013-03-12 – 2013-03-15 (×4): 150 mg via ORAL
  Filled 2013-03-12 (×5): qty 1

## 2013-03-12 MED ORDER — ENOXAPARIN SODIUM 100 MG/ML ~~LOC~~ SOLN
100.0000 mg | Freq: Two times a day (BID) | SUBCUTANEOUS | Status: DC
  Administered 2013-03-12 – 2013-03-16 (×9): 100 mg via SUBCUTANEOUS
  Filled 2013-03-12 (×11): qty 1

## 2013-03-12 MED ORDER — ACETAMINOPHEN 325 MG PO TABS: 650.0000 mg | ORAL_TABLET | Freq: Four times a day (QID) | ORAL | Status: DC | PRN

## 2013-03-12 MED ORDER — IPRATROPIUM-ALBUTEROL 0.5-2.5 (3) MG/3ML IN SOLN
3.0000 mL | Freq: Four times a day (QID) | RESPIRATORY_TRACT | Status: DC
  Administered 2013-03-12 – 2013-03-16 (×10): 3 mL via RESPIRATORY_TRACT
  Filled 2013-03-12 (×10): qty 3

## 2013-03-12 MED ORDER — AMOXICILLIN-POT CLAVULANATE 875-125 MG PO TABS
1.0000 | ORAL_TABLET | Freq: Two times a day (BID) | ORAL | Status: DC
  Administered 2013-03-12 – 2013-03-15 (×7): 1 via ORAL
  Filled 2013-03-12 (×8): qty 1

## 2013-03-12 MED ORDER — METHYLPREDNISOLONE SODIUM SUCC 40 MG IJ SOLR
20.0000 mg | Freq: Two times a day (BID) | INTRAMUSCULAR | Status: DC
  Administered 2013-03-12 – 2013-03-14 (×4): 20 mg via INTRAVENOUS
  Filled 2013-03-12 (×3): qty 0.5
  Filled 2013-03-12: qty 1
  Filled 2013-03-12: qty 0.5

## 2013-03-12 NOTE — Progress Notes (Addendum)
Pulmonary/Critical Care Progress Note    Name: Meagan Garcia MRN: [id number] DOB: [birth year]    ADMISSION DATE:  03/07/2013  REFERRING MD :  ER  CHIEF COMPLAINT:  resp distress, hypoxia  BRIEF PATIENT DESCRIPTION:  63 yo female former smoker presented with progressive dyspnea and hypoxic respiratory failure from acute PE and atelectasis.  She was found to have in December 2014 Rt supra-hilar mass and mediastinal with b/l hilar LAN from Stage IIIB NSCLC (invasive squamous cell).  Started on XRT (Kinard) and planned for chemo (Mohammed) 12/30.  SIGNIFICANT EVENTS: 12/29 Admit 12/30 Oncology, radiation oncology consulted  STUDIES:  CT chest 12/29 >> multiple Lt sided segmental/subsegmental PE's, malignant obstruction Rt upper lobe PA, no change Rt suprahilar mass with complete obstruction of Rt mainstem bronchus, small Rt pleural effusion Doppler legs 12/30 >> Rt peroneal vein DVT Bronchoscopy 12/31 >> R mainstem occlusion w CA  LINES / TUBES: Rt PICC 1/01 >>   CULTURES: Sputum 12/31 >>   ANTIBIOTICS: 12/30 cefepime >> 1/03 12/30 vancomycin >> 1/03  01/03 Augmentin >>   SUBJECTIVE:  Breathing better >> feels she can take deeper breaths.  Denies chest pain.  VITAL SIGNS: Temp:  [97.5 F (36.4 C)-98.4 F (36.9 C)] 97.5 F (36.4 C) (01/03 0000) Pulse Rate:  [89-101] 95 (01/03 0913) Resp:  [14-30] 19 (01/03 0425) BP: (125-154)/(56-82) 141/63 mmHg (01/03 0913) SpO2:  [91 %-100 %] 97 % (01/03 0737) Weight:  [220 lb 0.3 oz (99.8 kg)] 220 lb 0.3 oz (99.8 kg) (01/03 0500) Room air  INTAKE / OUTPUT: Intake/Output     01/02 0701 - 01/03 0700 01/03 0701 - 01/04 0700   P.O. 720    I.V. (mL/kg) 748.4 (7.5)    IV Piggyback 450    Total Intake(mL/kg) 1918.4 (19.2)    Urine (mL/kg/hr) 1400 (0.6)    Total Output 1400     Net +518.4          Urine Occurrence 2 x    Stool Occurrence 2 x      PHYSICAL EXAMINATION: Gen: no distress HEENT: no sinus tenderness Lungs: no  wheeze, decreased BS Rt base Cardiovascular: regular, no murmur Abdomen: soft and non-tender Musculoskeletal: No edema Neuro: normal strength Skin: no rashes   LABS:  CBC  Recent Labs Lab 03/10/13 0331 03/11/13 0347 03/12/13 0350  WBC 20.3* 15.6* 13.1*  HGB 12.5 12.0 12.0  HCT 40.3 38.1 38.3  PLT 275 289 281   BMET  Recent Labs Lab 03/08/13 0354 03/09/13 0359 03/12/13 0350  NA 134* 133* 135*  K 4.5 4.4 4.7  CL 98 98 102  CO2 18* 18* 22  BUN 8 14 21   CREATININE 0.87 0.87 0.73  GLUCOSE 114* 193* 168*   Electrolytes  Recent Labs Lab 03/08/13 0354 03/09/13 0359 03/12/13 0350  CALCIUM 9.6 9.9 9.3  MG 2.2  --  2.3  PHOS 4.6  --  2.4   ABG  Recent Labs Lab 03/07/13 1445  PHART 7.465*  PCO2ART 29.2*  PO2ART 59.7*   Liver Enzymes  Recent Labs Lab 03/07/13 1210  AST 45*  ALT 89*  ALKPHOS 150*  BILITOT 0.8  ALBUMIN 3.1*   Cardiac Enzymes  Recent Labs Lab 03/07/13 1210  TROPONINI 0.35*  PROBNP 933.4*   Imaging No results found.  ASSESSMENT / PLAN:  A: Acute respiratory failure 2nd to acute PE/DVT and RML/RLL lung collapse from NSCLC with compression of Rt mainstem bronchus. Possible post-obstructive PNA. Hx of smoking with ? COPD. P: -  f/u CXR 1/05 -change Abx to augmentin 1/03 >> continue for 7 day total of Abx -change solumedrol to 20 mg q12h and wean off as tolerated -d/c mucomyst 1/03 -continue duoneb -change to therapeutic lovenox 1/03 and d/c heparin gtt -oncology planning chemotherapy week of 1/05 >> should be okay to proceed -continue XRT -will need MRI brain at some point to assess for metastatic disease  A: Hx of HTN. P: -continue coreg -not sure why she is on ASA >> will d/c 1/03 -hold aldactone for now  A: Leukocytosis - likely from steroids. P: -f/u CBC  A: Constipation. Hx of GERD. P: -bowel regimen -continue protonix  A: Hx of anxiety, depression. P: -ativan qhs -resume wellbutrin  1/03  Summary: Respiratory status improved.  Will transfer to non tele floor bed.  Keep on PCCM service.  Updated pt's family at bedside.  Chesley Mires, MD [facility] Pulmonary/Critical Care 03/12/2013, 10:43 AM Pager:  [phone] After 3pm call: [phone]

## 2013-03-12 NOTE — Progress Notes (Signed)
ANTICOAGULATION CONSULT NOTE - Initial Consult  Pharmacy Consult for enoxaparin Indication: pulmonary embolus and DVT  No Known Allergies  Patient Measurements: Height: 5\' 5"  (165.1 cm) Weight: 220 lb 0.3 oz (99.8 kg) IBW/kg (Calculated) : 57   Vital Signs: Temp: 97.7 F (36.5 C) (01/03 0800) Temp src: Oral (01/03 0800) BP: 141/63 mmHg (01/03 0913) Pulse Rate: 99 (01/03 1000)  Labs:  Recent Labs  03/10/13 0331 03/11/13 0347 03/11/13 1228 03/11/13 1905 03/12/13 0350  HGB 12.5 12.0  --   --  12.0  HCT 40.3 38.1  --   --  38.3  PLT 275 289  --   --  281  HEPARINUNFRC 0.31 0.80* 0.99* 0.56 0.39  CREATININE  --   --   --   --  0.73    Estimated Creatinine Clearance: 86.4 ml/min (by C-G formula based on Cr of 0.73).   Medical History: Past Medical History  Diagnosis Date  . Anxiety   . Depression   . Peripheral neuropathy     feet: unknown etiol  . Elevated glucose   . Allergic rhinitis   . GERD (gastroesophageal reflux disease)   . Complication of anesthesia w/tubal    got anest. "went crazy"   . Non-small cell lung cancer     Assessment: 45 yoF well known to Pharmacy for heparin dosing started 03/07/13 now changing to enoxaparin for continued treatment of DVT and PE.  Heparin level in therapeutic range this AM on 1200 units/hr  CBC stable  No bleeding reported  CrCl 86 ml/min  Weight documented as 99.8kg, appears stable around this value for the past month  Patient appropriate for enoxaparin 1mg /kg dosing q12h   Goal of Therapy:  Anti-Xa level 0.6-1 units/ml 4hrs after LMWH dose given Monitor platelets by anticoagulation protocol: Yes   Plan:  - stop heparin gtt today at 1100 - start enoxaparin 100mg  SQ q12h today at 1200 - monitor for bleeding - CBC at least q72h while inpatient  Thank you for the consult.  Johny Drilling, PharmD, BCPS Clinical Pharmacist Pager: [phone] Pharmacy: [phone] 03/12/2013 11:19 AM

## 2013-03-12 NOTE — Progress Notes (Addendum)
ANTICOAGULATION CONSULT NOTE - Follow Up Consult  Pharmacy Consult for Heparin Indication: pulmonary embolus and DVT  No Known Allergies  Patient Measurements: Height: 5\' 5"  (165.1 cm) Weight: 220 lb 0.3 oz (99.8 kg) IBW/kg (Calculated) : 57 Heparin Dosing Weight: 79 kg  Vital Signs: Temp: 97.5 F (36.4 C) (01/03 0000) Temp src: Oral (01/03 0000) BP: 139/64 mmHg (01/03 0425) Pulse Rate: 94 (01/03 0425)  Labs:  Recent Labs  03/10/13 0331 03/11/13 0347 03/11/13 1228 03/11/13 1905 03/12/13 0350  HGB 12.5 12.0  --   --  12.0  HCT 40.3 38.1  --   --  38.3  PLT 275 289  --   --  281  HEPARINUNFRC 0.31 0.80* 0.99* 0.56 0.39  CREATININE  --   --   --   --  0.73    Estimated Creatinine Clearance: Meagan.4 ml/min (by C-G formula based on Cr of 0.73).   Medications:  Infusions:  . heparin 1,200 Units/hr (03/11/13 1313)    Assessment: Meagan Garcia with PMH significant for newly diagnosed right bronchogenic mass (diagnosed 02/18/13 by CT scan) presents with SOB with exertion s/p radiation treatment today.  Patient with r/o ACS, +PE and +DVT.  Pharmacy consulted to dose IV heparin starting 12/29.  Heparin level today = 0.39 - in therapeutic range with infusion at 1200 units/hr.  CBC: Hgb and platelets WNL No bleeding reported/documented.  Goal of Therapy:  Heparin level 0.3-0.7 units/ml Monitor platelets by anticoagulation protocol: Yes   Plan:   Cont heparin at 1200 units/hr(12 mL/hr).  F/u daily heparin level and CBC.  Continue to monitor H&H and platelets.  Follow up long-term plan for anticoagulation (per oncology note 12/30, change to SQ enoxaparin or PO xarelto prior to discharge)  Doreene Eland, PharmD, BCPS.   Pager: [phone]  03/12/2013,7:26 AM.

## 2013-03-12 NOTE — Progress Notes (Signed)
[place] 1226 TO TRANSFER TO 1308- REPORT CALLED TO [facility] RN. NO CHANGE IN PATIENT'S CONDITION. TRANSFER WITH TRANSPORTER IN [facility]. ON RA.

## 2013-03-13 LAB — CBC
HCT: 38.9 % (ref 36.0–46.0)
Hemoglobin: 12.4 g/dL (ref 12.0–15.0)
MCH: 27.6 pg (ref 26.0–34.0)
MCHC: 31.9 g/dL (ref 30.0–36.0)
MCV: 86.4 fL (ref 78.0–100.0)
PLATELETS: 284 10*3/uL (ref 150–400)
RBC: 4.5 MIL/uL (ref 3.87–5.11)
RDW: 14.9 % (ref 11.5–15.5)
WBC: 11.2 10*3/uL — AB (ref 4.0–10.5)

## 2013-03-13 LAB — BASIC METABOLIC PANEL
BUN: 23 mg/dL (ref 6–23)
CHLORIDE: 101 meq/L (ref 96–112)
CO2: 23 mEq/L (ref 19–32)
CREATININE: 0.81 mg/dL (ref 0.50–1.10)
Calcium: 9.5 mg/dL (ref 8.4–10.5)
GFR calc non Af Amer: 77 mL/min — ABNORMAL LOW (ref >90)
GFR, EST AFRICAN AMERICAN: 89 mL/min — AB (ref >90)
Glucose, Bld: 98 mg/dL (ref 70–99)
Potassium: 4 mEq/L (ref 3.7–5.3)
Sodium: 136 mEq/L — ABNORMAL LOW (ref 137–147)

## 2013-03-13 NOTE — Progress Notes (Signed)
Pulmonary/Critical Care Progress Note    Name: Meagan Garcia MRN: [id number] DOB: [birth year]    ADMISSION DATE:  03/07/2013  REFERRING MD :  ER  CHIEF COMPLAINT:  resp distress, hypoxia  BRIEF PATIENT DESCRIPTION:  63 yo female former smoker presented with progressive dyspnea and hypoxic respiratory failure from acute PE and atelectasis.  She was found to have in December 2014 Rt supra-hilar mass and mediastinal with b/l hilar LAN from Stage IIIB NSCLC (invasive squamous cell).  Started on XRT ([facility]) and planned for chemo ([facility]) 12/30.  SIGNIFICANT EVENTS: 12/29 Admit 12/30 Oncology, radiation oncology consulted  STUDIES:  CT chest 12/29 >> multiple Lt sided segmental/subsegmental PE's, malignant obstruction Rt upper lobe PA, no change Rt suprahilar mass with complete obstruction of Rt mainstem bronchus, small Rt pleural effusion Doppler legs 12/30 >> Rt peroneal vein DVT Bronchoscopy 12/31 >> R mainstem occlusion w CA  LINES / TUBES: Rt PICC 1/01 >>   CULTURES: Sputum 12/31 >>   ANTIBIOTICS: 12/30 cefepime >> 1/03 12/30 vancomycin >> 1/03  01/03 Augmentin >>   SUBJECTIVE:  She feels she is breathing better today.  No purulence or hemoptysis  VITAL SIGNS: Temp:  [97.6 F (36.4 C)-98.3 F (36.8 C)] 97.7 F (36.5 C) (01/04 0559) Pulse Rate:  [79-88] 79 (01/04 0559) Resp:  [18-20] 20 (01/04 0559) BP: (134-140)/(62-70) 140/70 mmHg (01/04 0559) SpO2:  [91 %-95 %] 91 % (01/04 0734) Weight:  [99.791 kg (220 lb)] 99.791 kg (220 lb) (01/03 1530) Room air  INTAKE / OUTPUT: Intake/Output     01/03 0701 - 01/04 0700 01/04 0701 - 01/05 0700   P.O. 360 240   I.V. (mL/kg) 208 (2.1)    IV Piggyback 50    Total Intake(mL/kg) 618 (6.2) 240 (2.4)   Urine (mL/kg/hr)     Total Output       Net +618 +240        Urine Occurrence 5 x 1 x   Stool Occurrence 2 x      PHYSICAL EXAMINATION: Gen: no distress HEENT: nose without purulence, op clear, no LN or  TMG Lungs: decreased bs on the right, a few scattered crackles. Cardiovascular: regular, no murmur Abdomen: soft and non-tender Musculoskeletal: No edema Neuro: normal strength, alert and oriented.  LABS:  CBC  Recent Labs Lab 03/11/13 0347 03/12/13 0350 03/13/13 0655  WBC 15.6* 13.1* 11.2*  HGB 12.0 12.0 12.4  HCT 38.1 38.3 38.9  PLT 289 281 284   BMET  Recent Labs Lab 03/09/13 0359 03/12/13 0350 03/13/13 0655  NA 133* 135* 136*  K 4.4 4.7 4.0  CL 98 102 101  CO2 18* 22 23  BUN 14 21 23   CREATININE 0.87 0.73 0.81  GLUCOSE 193* 168* 98   Electrolytes  Recent Labs Lab 03/08/13 0354 03/09/13 0359 03/12/13 0350 03/13/13 0655  CALCIUM 9.6 9.9 9.3 9.5  MG 2.2  --  2.3  --   PHOS 4.6  --  2.4  --    ABG  Recent Labs Lab 03/07/13 1445  PHART 7.465*  PCO2ART 29.2*  PO2ART 59.7*   Liver Enzymes  Recent Labs Lab 03/07/13 1210  AST 45*  ALT 89*  ALKPHOS 150*  BILITOT 0.8  ALBUMIN 3.1*   Cardiac Enzymes  Recent Labs Lab 03/07/13 1210  TROPONINI 0.35*  PROBNP 933.4*   Imaging No results found.  ASSESSMENT / PLAN:  A: Acute respiratory failure 2nd to acute PE/DVT and RML/RLL lung collapse from NSCLC with compression of  Rt mainstem bronchus. Possible post-obstructive PNA. Hx of smoking with ? COPD. P: -f/u CXR 1/05 -change Abx to augmentin 1/03 >> continue for 7 day total of Abx -wean steroids as tolerated.  -continue duoneb -oncology planning chemotherapy week of 1/05 >> should be okay to proceed -continue XRT -will need MRI brain at some point to assess for metastatic disease  A: Hx of HTN. P: -continue coreg

## 2013-03-14 ENCOUNTER — Ambulatory Visit: Payer: Self-pay

## 2013-03-14 ENCOUNTER — Encounter: Payer: Self-pay | Admitting: Internal Medicine

## 2013-03-14 ENCOUNTER — Other Ambulatory Visit: Payer: BC Managed Care – PPO

## 2013-03-14 ENCOUNTER — Inpatient Hospital Stay (HOSPITAL_COMMUNITY): Payer: BC Managed Care – PPO

## 2013-03-14 ENCOUNTER — Ambulatory Visit
Admission: RE | Admit: 2013-03-14 | Discharge: 2013-03-14 | Disposition: A | Discharge status: Home / Self Care | Payer: BC Managed Care – PPO | Source: Ambulatory Visit | Attending: Radiation Oncology | Admitting: Radiation Oncology

## 2013-03-14 DIAGNOSIS — C3400 Malignant neoplasm of unspecified main bronchus: Secondary | ICD-10-CM

## 2013-03-14 LAB — CBC
HCT: 39.7 % (ref 36.0–46.0)
Hemoglobin: 12.6 g/dL (ref 12.0–15.0)
MCH: 27.6 pg (ref 26.0–34.0)
MCHC: 31.7 g/dL (ref 30.0–36.0)
MCV: 86.9 fL (ref 78.0–100.0)
Platelets: 240 10*3/uL (ref 150–400)
RBC: 4.57 MIL/uL (ref 3.87–5.11)
RDW: 14.9 % (ref 11.5–15.5)
WBC: 10 10*3/uL (ref 4.0–10.5)

## 2013-03-14 MED ORDER — METHYLPREDNISOLONE SODIUM SUCC 125 MG IJ SOLR
125.0000 mg | Freq: Once | INTRAMUSCULAR | Status: AC | PRN
  Filled 2013-03-14: qty 2

## 2013-03-14 MED ORDER — SODIUM CHLORIDE 0.9 % IV SOLN
276.0000 mg | Freq: Once | INTRAVENOUS | Status: AC
  Administered 2013-03-14: 280 mg via INTRAVENOUS
  Filled 2013-03-14: qty 28

## 2013-03-14 MED ORDER — DIPHENHYDRAMINE HCL 50 MG/ML IJ SOLN: 50.0000 mg | Freq: Once | INTRAMUSCULAR | Status: AC | PRN

## 2013-03-14 MED ORDER — EPINEPHRINE HCL 1 MG/ML IJ SOLN
0.2500 mg | Freq: Once | INTRAMUSCULAR | Status: AC | PRN
  Filled 2013-03-14: qty 1

## 2013-03-14 MED ORDER — HEPARIN SOD (PORK) LOCK FLUSH 100 UNIT/ML IV SOLN: 250.0000 [IU] | Freq: Once | INTRAVENOUS | Status: AC | PRN

## 2013-03-14 MED ORDER — EPINEPHRINE HCL 1 MG/ML IJ SOLN
0.5000 mg | Freq: Once | INTRAMUSCULAR | Status: AC | PRN
  Filled 2013-03-14: qty 1

## 2013-03-14 MED ORDER — DIPHENHYDRAMINE HCL 50 MG/ML IJ SOLN: 25.0000 mg | Freq: Once | INTRAMUSCULAR | Status: AC | PRN

## 2013-03-14 MED ORDER — SODIUM CHLORIDE 0.9 % IV SOLN
Freq: Once | INTRAVENOUS | Status: AC
  Administered 2013-03-14: 15:00:00 via INTRAVENOUS

## 2013-03-14 MED ORDER — PREDNISONE 20 MG PO TABS
40.0000 mg | ORAL_TABLET | Freq: Every day | ORAL | Status: DC
  Administered 2013-03-15 – 2013-03-16 (×2): 40 mg via ORAL
  Filled 2013-03-14 (×3): qty 2

## 2013-03-14 MED ORDER — SODIUM CHLORIDE 0.9 % IV SOLN
Freq: Once | INTRAVENOUS | Status: AC
  Administered 2013-03-14: 16 mg via INTRAVENOUS
  Filled 2013-03-14: qty 8

## 2013-03-14 MED ORDER — SODIUM CHLORIDE 0.9 % IV SOLN: Freq: Once | INTRAVENOUS | Status: AC | PRN

## 2013-03-14 MED ORDER — DIPHENHYDRAMINE HCL 50 MG/ML IJ SOLN
50.0000 mg | Freq: Once | INTRAMUSCULAR | Status: AC
  Administered 2013-03-14: 50 mg via INTRAVENOUS
  Filled 2013-03-14: qty 1

## 2013-03-14 MED ORDER — FAMOTIDINE IN NACL 20-0.9 MG/50ML-% IV SOLN
20.0000 mg | Freq: Once | INTRAVENOUS | Status: AC
  Administered 2013-03-14: 20 mg via INTRAVENOUS
  Filled 2013-03-14: qty 50

## 2013-03-14 MED ORDER — SODIUM CHLORIDE 0.9 % IJ SOLN: 3.0000 mL | INTRAMUSCULAR | Status: DC | PRN

## 2013-03-14 MED ORDER — ALBUTEROL SULFATE (2.5 MG/3ML) 0.083% IN NEBU: 2.5000 mg | INHALATION_SOLUTION | Freq: Once | RESPIRATORY_TRACT | Status: AC | PRN

## 2013-03-14 MED ORDER — ALTEPLASE 2 MG IJ SOLR
2.0000 mg | Freq: Once | INTRAMUSCULAR | Status: AC | PRN
  Filled 2013-03-14: qty 2

## 2013-03-14 MED ORDER — PACLITAXEL CHEMO INJECTION 300 MG/50ML
45.0000 mg/m2 | Freq: Once | INTRAVENOUS | Status: AC
  Administered 2013-03-14: 96 mg via INTRAVENOUS
  Filled 2013-03-14: qty 16

## 2013-03-14 MED ORDER — COLD PACK MISC ONCOLOGY
1.0000 | Freq: Once | Status: AC | PRN
  Filled 2013-03-14: qty 1

## 2013-03-14 MED ORDER — SODIUM CHLORIDE 0.9 % IJ SOLN: 10.0000 mL | INTRAMUSCULAR | Status: DC | PRN

## 2013-03-14 MED ORDER — FAMOTIDINE IN NACL 20-0.9 MG/50ML-% IV SOLN
20.0000 mg | Freq: Once | INTRAVENOUS | Status: AC | PRN
  Filled 2013-03-14: qty 50

## 2013-03-14 MED ORDER — HEPARIN SOD (PORK) LOCK FLUSH 100 UNIT/ML IV SOLN: 500.0000 [IU] | Freq: Once | INTRAVENOUS | Status: AC | PRN

## 2013-03-14 NOTE — Progress Notes (Signed)
Mailed husband's fmla form to [facility], in self addressed envelope.

## 2013-03-14 NOTE — Progress Notes (Signed)
Pulmonary/Critical Care Progress Note    Name: Meagan Garcia MRN: [id number] DOB: [birth year]    ADMISSION DATE:  03/07/2013  REFERRING MD :  ER  CHIEF COMPLAINT:  resp distress, hypoxia  BRIEF PATIENT DESCRIPTION:  63 yo female former smoker presented with progressive dyspnea and hypoxic respiratory failure from acute PE and atelectasis.  She was found to have in December 2014 Rt supra-hilar mass and mediastinal with b/l hilar LAN from Stage IIIB NSCLC (invasive squamous cell).  Started on XRT ([facility]) and planned for chemo ([facility]) 12/30.  SIGNIFICANT EVENTS: 12/29 Admit 12/30 Oncology, radiation oncology consulted  STUDIES:  CT chest 12/29 >> multiple Lt sided segmental/subsegmental PE's, malignant obstruction Rt upper lobe PA, no change Rt suprahilar mass with complete obstruction of Rt mainstem bronchus, small Rt pleural effusion Doppler legs 12/30 >> Rt peroneal vein DVT Bronchoscopy 12/31 >> R mainstem occlusion w CA  LINES / TUBES: Rt PICC 1/01 >>   CULTURES: Sputum 12/31 >> nl flora  ANTIBIOTICS: 12/30 cefepime >> 1/03 12/30 vancomycin >> 1/03  01/03 Augmentin >>   SUBJECTIVE:  She feels she is breathing better today.  No purulence or hemoptysis, still weak but better.  VITAL SIGNS: Temp:  [98.4 F (36.9 C)-98.6 F (37 C)] 98.4 F (36.9 C) (01/05 0610) Pulse Rate:  [81-96] 96 (01/05 0610) Resp:  [18-20] 20 (01/05 0610) BP: (118-142)/(61-78) 142/78 mmHg (01/05 0610) SpO2:  [94 %-100 %] 94 % (01/05 0818) Room air  INTAKE / OUTPUT: Intake/Output     01/04 0701 - 01/05 0700 01/05 0701 - 01/06 0700   P.O. 960    I.V. (mL/kg)     IV Piggyback     Total Intake(mL/kg) 960 (9.6)    Net +960          Urine Occurrence 8 x    Stool Occurrence       PHYSICAL EXAMINATION: Gen: no distress HEENT: nose without purulence, op clear, no LN or TMG Lungs: decreased bs on the right, a few scattered crackles. Cardiovascular: regular, no murmur Abdomen: soft  and non-tender Musculoskeletal: No edema Neuro: normal strength, alert and oriented.  LABS:  CBC  Recent Labs Lab 03/12/13 0350 03/13/13 0655 03/14/13 0530  WBC 13.1* 11.2* 10.0  HGB 12.0 12.4 12.6  HCT 38.3 38.9 39.7  PLT 281 284 240   BMET  Recent Labs Lab 03/09/13 0359 03/12/13 0350 03/13/13 0655  NA 133* 135* 136*  K 4.4 4.7 4.0  CL 98 102 101  CO2 18* 22 23  BUN 14 21 23   CREATININE 0.87 0.73 0.81  GLUCOSE 193* 168* 98   Electrolytes  Recent Labs Lab 03/08/13 0354 03/09/13 0359 03/12/13 0350 03/13/13 0655  CALCIUM 9.6 9.9 9.3 9.5  MG 2.2  --  2.3  --   PHOS 4.6  --  2.4  --    ABG  Recent Labs Lab 03/07/13 1445  PHART 7.465*  PCO2ART 29.2*  PO2ART 59.7*   Liver Enzymes No results found for this basename: AST, ALT, ALKPHOS, BILITOT, ALBUMIN,  in the last 168 hours Cardiac Enzymes No results found for this basename: TROPONINI, PROBNP,  in the last 168 hours Imaging Dg Chest 2 View  03/14/2013   CLINICAL DATA:  History of lung cancer, followup atelectasis  EXAM: CHEST  2 VIEW  COMPARISON:  03/09/2013  FINDINGS: The cardiac shadow is stable. The left lung remains clear. A right-sided PICC line is again identified in satisfactory position. Persistent elevation of the right hemidiaphragm  with consolidation of the right lung base is noted. A small pleural effusion is noted as well. The overall appearance is stable from the prior exam.  IMPRESSION: No significant change from the prior study.   Electronically Signed   By: Inez Catalina M.D.   On: 03/14/2013 08:04    ASSESSMENT / PLAN:  A: Acute respiratory failure 2nd to acute PE/DVT and RML/RLL lung collapse from NSCLC with compression of Rt mainstem bronchus. Possible post-obstructive PNA. Hx of smoking with ? COPD. P: -f/u CXR 1/05, [place] -change Abx to augmentin 1/03 >> continue for 7 day total of Abx -wean steroids as tolerated. 1-5 change to prednisone -continue duoneb -oncology planning  chemotherapy week of 1/05 >> should be okay to proceed -continue XRT -will need MRI brain at some point to assess for metastatic disease -LMWH for PE anticoagulation  A: Hx of HTN. P: -continue coreg  Global: May be ready to dc home in near future. transition to po steroids 1-5.   Richardson Landry Minor ACNP [facility] PCC[facility] Pager [phone] till 3 pm If no answer page [phone] 03/14/2013, 12:25 PM  Baltazar Apo, MD, PhD 03/14/2013, 4:13 PM [facility] [phone] or if no answer [phone]

## 2013-03-14 NOTE — Plan of Care (Signed)
Problem: Phase II Progression Outcomes Goal: Activity at appropriate level-compared to baseline (UP IN CHAIR FOR HEMODIALYSIS)  Outcome: Completed/Met Date Met:  03/14/13 Up oob in room on own

## 2013-03-14 NOTE — Progress Notes (Signed)
[facility] Therapy Treatment Record Phone [phone]   Radiation Therapy was administered to Meagan Garcia on: 03/14/2013  2:40 PM and was treatment # 7 out of a planned course of 35 treatments.

## 2013-03-14 NOTE — Progress Notes (Signed)
Subjective: The patient is seen and examined today. She is feeling much better with no significant complaints. She denied having any significant fever or chills. She has no chest pain and have shortness of breath has significantly improved.  Objective: Vital signs in last 24 hours: Temp:  [98.4 F (36.9 C)-98.6 F (37 C)] 98.4 F (36.9 C) (01/05 0610) Pulse Rate:  [81-96] 96 (01/05 0610) Resp:  [18-20] 20 (01/05 0610) BP: (118-142)/(61-78) 142/78 mmHg (01/05 0610) SpO2:  [94 %-100 %] 94 % (01/05 0818)  Intake/Output from previous day: 01/04 0701 - 01/05 0700 In: 960 [P.O.:960] Out: -  Intake/Output this shift:    General appearance: alert, cooperative and no distress Resp: wheezes RLL Cardio: regular rate and rhythm, S1, S2 normal, no murmur, click, rub or gallop GI: soft, non-tender; bowel sounds normal; no masses,  no organomegaly Extremities: extremities normal, atraumatic, no cyanosis or edema  Lab Results:   Recent Labs  03/13/13 0655 03/14/13 0530  WBC 11.2* 10.0  HGB 12.4 12.6  HCT 38.9 39.7  PLT 284 240   BMET  Recent Labs  03/12/13 0350 03/13/13 0655  NA 135* 136*  K 4.7 4.0  CL 102 101  CO2 22 23  GLUCOSE 168* 98  BUN 21 23  CREATININE 0.73 0.81  CALCIUM 9.3 9.5    Studies/Results: Dg Chest 2 View  03/14/2013   CLINICAL DATA:  History of lung cancer, followup atelectasis  EXAM: CHEST  2 VIEW  COMPARISON:  03/09/2013  FINDINGS: The cardiac shadow is stable. The left lung remains clear. A right-sided PICC line is again identified in satisfactory position. Persistent elevation of the right hemidiaphragm with consolidation of the right lung base is noted. A small pleural effusion is noted as well. The overall appearance is stable from the prior exam.  IMPRESSION: No significant change from the prior study.   Electronically Signed   By: Inez Catalina M.D.   On: 03/14/2013 08:04    Medications: I have reviewed the patient's current  medications.   Assessment/Plan: This is a very pleasant 63 years old [place] American female with history of stage IIIB non-small cell lung cancer currently undergoing concurrent chemoradiation. She completed more than one week of radiotherapy but today she'll receive the first dose of concurrent chemotherapy. The patient is feeling fine with no specific complaints. We'll proceed with the first cycle of concurrent chemotherapy with carboplatin for AUC of 2 and paclitaxel 45 mg/M2. The patient was reminded of the adverse effect of the chemotherapy including but not limited to alopecia, myelosuppression, nausea and vomiting, peripheral neuropathy, liver or renal dysfunction. She would like to proceed with treatment as planned. She is expected to be discharged from the hospital in the next few days as she has significant improvement in her condition. She will followup with me on an outpatient basis and 1-2 weeks. She was advised to call immediately if she has any concerning symptoms.   LOS: 7 days    Kysen Wetherington K. 03/14/2013

## 2013-03-15 ENCOUNTER — Ambulatory Visit
Admission: RE | Admit: 2013-03-15 | Discharge: 2013-03-15 | Disposition: A | Discharge status: Home / Self Care | Payer: BC Managed Care – PPO | Source: Ambulatory Visit | Attending: Radiation Oncology | Admitting: Radiation Oncology

## 2013-03-15 ENCOUNTER — Inpatient Hospital Stay (HOSPITAL_COMMUNITY): Payer: BC Managed Care – PPO

## 2013-03-15 ENCOUNTER — Encounter: Payer: Self-pay | Admitting: Radiation Oncology

## 2013-03-15 VITALS — BP 123/68 | HR 94 | Temp 97.7°F | Ht 65.0 in | Wt 206.9 lb

## 2013-03-15 DIAGNOSIS — R0989 Other specified symptoms and signs involving the circulatory and respiratory systems: Secondary | ICD-10-CM

## 2013-03-15 DIAGNOSIS — Z5111 Encounter for antineoplastic chemotherapy: Secondary | ICD-10-CM

## 2013-03-15 DIAGNOSIS — R0609 Other forms of dyspnea: Secondary | ICD-10-CM

## 2013-03-15 DIAGNOSIS — C3491 Malignant neoplasm of unspecified part of right bronchus or lung: Secondary | ICD-10-CM

## 2013-03-15 LAB — BASIC METABOLIC PANEL
BUN: 17 mg/dL (ref 6–23)
CO2: 24 mEq/L (ref 19–32)
Calcium: 9.2 mg/dL (ref 8.4–10.5)
Chloride: 100 mEq/L (ref 96–112)
Creatinine, Ser: 0.76 mg/dL (ref 0.50–1.10)
GFR calc Af Amer: >90 mL/min (ref >90)
GFR calc non Af Amer: 89 mL/min — ABNORMAL LOW (ref >90)
Glucose, Bld: 130 mg/dL — ABNORMAL HIGH (ref 70–99)
Potassium: 4.4 mEq/L (ref 3.7–5.3)
SODIUM: 136 meq/L — AB (ref 137–147)

## 2013-03-15 LAB — CBC
HCT: 41.7 % (ref 36.0–46.0)
Hemoglobin: 13.4 g/dL (ref 12.0–15.0)
MCH: 27.8 pg (ref 26.0–34.0)
MCHC: 32.1 g/dL (ref 30.0–36.0)
MCV: 86.5 fL (ref 78.0–100.0)
Platelets: 259 10*3/uL (ref 150–400)
RBC: 4.82 MIL/uL (ref 3.87–5.11)
RDW: 15.1 % (ref 11.5–15.5)
WBC: 10.7 10*3/uL — ABNORMAL HIGH (ref 4.0–10.5)

## 2013-03-15 MED ORDER — OXYCODONE HCL 5 MG PO TABS: 5.0000 mg | ORAL_TABLET | Freq: Four times a day (QID) | ORAL | Status: DC | PRN

## 2013-03-15 MED ORDER — GADOBENATE DIMEGLUMINE 529 MG/ML IV SOLN
20.0000 mL | Freq: Once | INTRAVENOUS | Status: AC | PRN
  Administered 2013-03-15: 20 mL via INTRAVENOUS

## 2013-03-15 NOTE — Progress Notes (Signed)
[facility]     Rexene Edison, M.D. [place], [place] [address]               Blair Promise, M.D., Ph.D. Phone: [phone]      [place] A. Tammi Klippel, M.D. Fax: [phone]      Jodelle Gross, M.D., Ph.D.         Thea Silversmith, M.D.         Wyvonnia Lora, M.D Weekly Treatment Management Note  Name: Meagan Garcia     MRN: [id number]        CSN: [id number] Date: 03/15/2013      DOB: October 05, 1950  CC: [name], MD         [place]    Status: In patient  Diagnosis: The encounter diagnosis was Non-small cell cancer of right lung.  Current Dose: 14.4 Gy  Current Fraction: 8  Planned Dose: 63 Gy  Narrative: Meagan Garcia was seen today for weekly treatment management. The chart was checked and CBCT  were reviewed. She continues to be admitted for management of her pulmonary embolus. Overall the patient is feeling better and breathing better. She has minimal esophageal symptoms. She will need a Carafate suspension order upon discharge.  Review of patient's allergies indicates no known allergies. No current facility-administered medications for this encounter.   No current outpatient prescriptions on file.   Facility-Administered Medications Ordered in Other Encounters  Medication Dose Route Frequency Provider Last Rate Last Dose  . 0.9 %  sodium chloride infusion  250 mL Intravenous PRN Rigoberto Noel, MD   250 mL at 03/10/13 1003  . acetaminophen (TYLENOL) tablet 650 mg  650 mg Oral Q6H PRN Chesley Mires, MD      . amoxicillin-clavulanate (AUGMENTIN) 875-125 MG per tablet 1 tablet  1 tablet Oral Q12H Chesley Mires, MD   1 tablet at 03/15/13 1053  . antiseptic oral rinse (BIOTENE) solution 15 mL  15 mL Mouth Rinse BID Elsie Stain, MD   15 mL at 03/15/13 0800  . buPROPion (WELLBUTRIN SR) 12 hr tablet 150 mg  150 mg Oral Daily Chesley Mires, MD   150 mg at 03/14/13 2140  . carvedilol (COREG) tablet 6.25 mg  6.25 mg  Oral BID WC Rush Farmer, MD   6.25 mg at 03/15/13 9833  . docusate sodium (COLACE) capsule 200 mg  200 mg Oral BID Elsie Stain, MD   200 mg at 03/15/13 1051  . enoxaparin (LOVENOX) injection 100 mg  100 mg Subcutaneous Q12H Mosetta Pigeon, RPH   100 mg at 03/15/13 1255  . feeding supplement (ENSURE COMPLETE) (ENSURE COMPLETE) liquid 237 mL  237 mL Oral BID PC Reanne Maryland Pink, RD   237 mL at 03/15/13 1000  . HYDROcodone-homatropine (HYCODAN) 5-1.5 MG/5ML syrup 5 mL  5 mL Oral Q4H PRN Elsie Stain, MD   5 mL at 03/11/13 0857  . ipratropium-albuterol (DUONEB) 0.5-2.5 (3) MG/3ML nebulizer solution 3 mL  3 mL Nebulization Q6H WA Chesley Mires, MD   3 mL at 03/14/13 2059  . LORazepam (ATIVAN) tablet 0.5 mg  0.5 mg Oral QHS Collene Gobble, MD   0.5 mg at 03/14/13 2140  . morphine 2 MG/ML injection 2-4 mg  2-4 mg Intravenous Q2H PRN Elsie Stain, MD      . pantoprazole (PROTONIX) EC tablet 40 mg  40 mg Oral Daily Elsie Stain, MD  40 mg at 03/15/13 1051  . predniSONE (DELTASONE) tablet 40 mg  40 mg Oral Q breakfast Grace Bushy Minor, NP   40 mg at 03/15/13 9791  . sodium chloride 0.9 % injection 10 mL  10 mL Intracatheter PRN Curt Bears, MD      . sodium chloride 0.9 % injection 10-40 mL  10-40 mL Intracatheter Q12H Elsie Stain, MD   20 mL at 03/15/13 1051  . sodium chloride 0.9 % injection 10-40 mL  10-40 mL Intracatheter PRN Elsie Stain, MD   20 mL at 03/12/13 1125  . sodium chloride 0.9 % injection 3 mL  3 mL Intravenous PRN Curt Bears, MD       Labs:  Lab Results  Component Value Date   WBC 10.7* 03/15/2013   HGB 13.4 03/15/2013   HCT 41.7 03/15/2013   MCV 86.5 03/15/2013   PLT 259 03/15/2013   Lab Results  Component Value Date   CREATININE 0.76 03/15/2013   BUN 17 03/15/2013   NA 136* 03/15/2013   K 4.4 03/15/2013   CL 100 03/15/2013   CO2 24 03/15/2013   Lab Results  Component Value Date   ALT 89* 03/07/2013   AST 45* 03/07/2013   PHOS 2.4 03/12/2013   BILITOT 0.8  03/07/2013    Physical Examination:  Filed Vitals:   03/15/13 1014  BP: 123/68  Pulse: 94  Temp: 97.7 F (36.5 C)    Wt Readings from Last 3 Encounters:  03/12/13 220 lb (99.791 kg)  03/15/13 206 lb 14.4 oz (93.849 kg)  03/12/13 220 lb (99.791 kg)     Lungs -decreased breath sounds in the right lung field  Heart has regular rhythm and rate  Abdomen is soft and non tender with normal bowel sounds  Assessment:  Patient tolerating treatments well  Plan: Continue treatment per original radiation prescription

## 2013-03-15 NOTE — Progress Notes (Signed)
Subjective: The patient is seen and examined today. She is feeling well with no significant complaints. no fever or chills.no chest pain and have shortness of breath at rest. She is trying to ambulate this morning. Appetite within normal limits.  Objective: Vital signs in last 24 hours: Temp:  [97.4 F (36.3 C)-98.3 F (36.8 C)] 97.9 F (36.6 C) (01/06 0458) Pulse Rate:  [88-102] 88 (01/06 0458) Resp:  [16-18] 18 (01/06 0458) BP: (116-139)/(62-77) 116/68 mmHg (01/06 0458) SpO2:  [93 %-97 %] 97 % (01/06 0458)  Intake/Output from previous day: 01/05 0701 - 01/06 0700 In: 1642.9 [P.O.:840; I.V.:40; IV Piggyback:762.9] Out: 1875 [Urine:1875] Intake/Output this shift:    General appearance: alert, cooperative and no distress Mouth without thrush or mucositis Resp: decreased breath sounds on the right upper lung, few crackles at the bases. No wheezing today. Cardio: regular rate and rhythm, S1, S2 normal, no murmur, click, rub or gallop GI: soft, non-tender; bowel sounds normal; no masses,  no organomegaly Extremities: extremities normal, atraumatic, no cyanosis or edema.right upper extremity excess line within normal limits with no tenderness.  Lab Results:   Recent Labs  03/14/13 0530 03/15/13 0425  WBC 10.0 10.7*  HGB 12.6 13.4  HCT 39.7 41.7  PLT 240 259   BMET  Recent Labs  03/13/13 0655 03/15/13 0425  NA 136* 136*  K 4.0 4.4  CL 101 100  CO2 23 24  GLUCOSE 98 130*  BUN 23 17  CREATININE 0.81 0.76  CALCIUM 9.5 9.2    Studies/Results: Dg Chest 2 View  03/14/2013   CLINICAL DATA:  History of lung cancer, followup atelectasis  EXAM: CHEST  2 VIEW  COMPARISON:  03/09/2013  FINDINGS: The cardiac shadow is stable. The left lung remains clear. A right-sided PICC line is again identified in satisfactory position. Persistent elevation of the right hemidiaphragm with consolidation of the right lung base is noted. A small pleural effusion is noted as well. The overall  appearance is stable from the prior exam.  IMPRESSION: No significant change from the prior study.   Electronically Signed   By: Inez Catalina M.D.   On: 03/14/2013 08:04    Medications:  . amoxicillin-clavulanate  1 tablet Oral Q12H  . antiseptic oral rinse  15 mL Mouth Rinse BID  . buPROPion  150 mg Oral Daily  . carvedilol  6.25 mg Oral BID WC  . docusate sodium  200 mg Oral BID  . enoxaparin (LOVENOX) injection  100 mg Subcutaneous Q12H  . feeding supplement (ENSURE COMPLETE)  237 mL Oral BID PC  . ipratropium-albuterol  3 mL Nebulization Q6H WA  . LORazepam  0.5 mg Oral QHS  . pantoprazole  40 mg Oral Daily  . predniSONE  40 mg Oral Q breakfast  . sodium chloride  10-40 mL Intracatheter Q12H   sodium chloride, acetaminophen, HYDROcodone-homatropine, morphine injection, sodium chloride, sodium chloride, sodium chloride  Assessment/Plan:  This is a very pleasant 63 years old [place] American female with history of stage IIIB(T2b., N3, M0) non-small cell lung cancer ( invasive squamous cell)currently undergoing concurrent chemoradiation. She completed 7 of 35 planned radiation treatments. The patient is feeling fine with no specific complaints. She has received her  first cycle of concurrent chemotherapy with carboplatin for AUC of 2 and paclitaxel 45 mg/M2. She has tolerated chemo well .She is expected to be discharged from the hospital in the next few days as she has significant improvement in her condition. She will followup with Dr. Julien Nordmann  an outpatient basis and 1-2 weeks. She was advised to call immediately if she has any concerning symptoms.Appreciate multispecialty involved in her care.     LOS: 8 days    [facility] E 03/15/2013  ADDENDUM: Hematology/Oncology Attending: The patient is seen and examined. I agree with the above note. The patient is feeling fine today with no specific complaints. She tolerated the first dose of her concurrent chemotherapy therapy yesterday  with carboplatin and paclitaxel fairly well. She denied having any fever or chills. The patient denied having any nausea or vomiting. She is feeling very well today and expected to be discharged from the hospital in the next few days. She will have followup visit with me as previously scheduled at the [facility]. Please call if you have any questions.

## 2013-03-15 NOTE — Progress Notes (Signed)
NUTRITION FOLLOW UP  Intervention:   Encouraged PO and supplement intake Provided educational materials concerning common nutrition related side effects with chemotherapy  Nutrition Dx:   Inadequate oral intake related to decreased appetite as evidenced by 4.5 % weight loss in less than one month  Goal:   Pt to meet >/= 90% of their estimated nutrition needs   Monitor:   Total protein/energy intake, swallow profile, labs, weights  Assessment:    12/30:Pt reports having a poor appetite for the past couple weeks. She states she has still been eating daily but, less than usual. She recently started drinking Ensure supplements. Pt seen by RD 12/12 at which time pt was eating well and weighed 223 lbs- pt has lost 4.5% of her body weight since  1/06: Pt reported good appetite, is consuming approximately 75-100% of meals. -Has been following a soft diet, consisting of soups, puddings, ice cream, and Ensure as pt reported feelings of food "getting stuck" in throat. -Pt noted that MD was going to look into medications that would assist with difficulty swallowing. Provided pt with handout of recipes, and alternative therapies for tolerating foods/maintaining nutritional needs -Pt consumes Ensure at home. Provided pt with coupons, and handouts for nausea and potential changes in appetite or taste -Denied any n/v. MD noted pt tolerating chemo well   Height: Ht Readings from Last 1 Encounters:  03/12/13 5\' 5"  (1.651 m)    Weight Status:   Wt Readings from Last 1 Encounters:  03/12/13 220 lb (99.791 kg)   Estimated Nutritional Needs:  Kcal: 2000-2200  Protein: 100-115 grams  Fluid: >/= 2.2 L/day   Skin: WDL  Diet Order: General   Intake/Output Summary (Last 24 hours) at 03/15/13 1549 Last data filed at 03/15/13 1320  Gross per 24 hour  Intake 1782.87 ml  Output   1775 ml  Net   7.87 ml    Last BM: 1/05   Labs:   Recent Labs Lab 03/12/13 0350 03/13/13 0655  03/15/13 0425  NA 135* 136* 136*  K 4.7 4.0 4.4  CL 102 101 100  CO2 22 23 24   BUN 21 23 17   CREATININE 0.73 0.81 0.76  CALCIUM 9.3 9.5 9.2  MG 2.3  --   --   PHOS 2.4  --   --   GLUCOSE 168* 98 130*    CBG (last 3)  No results found for this basename: GLUCAP,  in the last 72 hours  Scheduled Meds: . antiseptic oral rinse  15 mL Mouth Rinse BID  . buPROPion  150 mg Oral Daily  . carvedilol  6.25 mg Oral BID WC  . docusate sodium  200 mg Oral BID  . enoxaparin (LOVENOX) injection  100 mg Subcutaneous Q12H  . feeding supplement (ENSURE COMPLETE)  237 mL Oral BID PC  . ipratropium-albuterol  3 mL Nebulization Q6H WA  . LORazepam  0.5 mg Oral QHS  . predniSONE  40 mg Oral Q breakfast  . sodium chloride  10-40 mL Intracatheter Q12H    Continuous Infusions:   Atlee Abide MS RD LDN Clinical Dietitian [phone]

## 2013-03-15 NOTE — Progress Notes (Signed)
ANTICOAGULATION CONSULT NOTE - Initial Consult  Pharmacy Consult for enoxaparin Indication: pulmonary embolus and DVT  No Known Allergies  Patient Measurements: Height: 5\' 5"  (165.1 cm) Weight: 220 lb (99.791 kg) IBW/kg (Calculated) : 57  Vital Signs: Temp: 97.7 F (36.5 C) (01/06 1014) Temp src: Oral (01/06 0458) BP: 123/68 mmHg (01/06 1014) Pulse Rate: 94 (01/06 1014)  Labs:  Recent Labs  03/13/13 0655 03/14/13 0530 03/15/13 0425  HGB 12.4 12.6 13.4  HCT 38.9 39.7 41.7  PLT 284 240 259  CREATININE 0.81  --  0.76   Estimated Creatinine Clearance: 86.4 ml/min (by C-G formula based on Cr of 0.76).  Assessment: 25 yoF well known to Pharmacy for heparin dosing started 03/07/13 now changing to enoxaparin for continued treatment of DVT and PE.  Heparin level in therapeutic range this AM on 1200 units/hr  CBC remains stable  No bleeding reported  Weight documented as 99.8kg, appears stable around this value for the past month  Patient appropriate for enoxaparin 1mg /kg dosing q12h  Goal of Therapy:  Anti-Xa level 0.6-1 units/ml 4hrs after LMWH dose given Monitor platelets by anticoagulation protocol: Yes   Plan:  - Enoxaparin 100mg  SQ q12h from 1/3 - monitor for bleeding - CBC daily -Consider dose change to 150mg  daily (1.5mg /kg/24hr) for long-term treatment  Thank you for the consult.  Minda Ditto PharmD Pager [phone] 03/15/2013, 1:45 PM

## 2013-03-15 NOTE — Progress Notes (Signed)
PULMONARY / CRITICAL CARE MEDICINE  Name: Meagan Garcia MRN: [id number] DOB: 06-May-1950    ADMISSION DATE:  03/07/2013  REFERRING MD :  ER  CHIEF COMPLAINT:   Respiratory distress  BRIEF PATIENT DESCRIPTION: 63 yo female former smoker presented with progressive dyspnea and hypoxic respiratory failure from acute PE and lung collapse.  She was found to have R supra-hilar mass and chest adenopathy. 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  Started on XRT (Kinard) and planned for chemo (Mohammed) 12/30.  SIGNIFICANT EVENTS: 12/29  Admited 12/30  Oncology, radiation oncology consulted 1/6      First chemo  STUDIES:  CT chest 12/29 >> multiple Lt sided segmental/subsegmental PE's, malignant obstruction Rt upper lobe PA, no change Rt suprahilar mass with complete obstruction of Rt mainstem bronchus, small Rt pleural effusion Doppler legs 12/30 >> Rt peroneal vein DVT Bronchoscopy 12/31 >> R mainstem occlusion  LINES / TUBES: Rt PICC 1/01 >>   CULTURES: Sputum 12/31 >>> neg  ANTIBIOTICS: Cefepime 12/30>> 1/03 Vancomycin 12/30>> 1/03  Augmentin >01/03>   SUBJECTIVE: She feels she is breathing better today.  No purulence or hemoptysis, still weak but better.  VITAL SIGNS: Temp:  [97.4 F (36.3 C)-98.3 F (36.8 C)] 97.7 F (36.5 C) (01/06 1014) Pulse Rate:  [88-102] 94 (01/06 1014) Resp:  [16-18] 18 (01/06 0458) BP: (116-139)/(62-77) 123/68 mmHg (01/06 1014) SpO2:  [93 %-97 %] 95 % (01/06 1014) Weight:  [206 lb 14.4 oz (93.849 kg)] 206 lb 14.4 oz (93.849 kg) (01/06 1014) Room air  INTAKE / OUTPUT: Intake/Output     01/05 0701 - 01/06 0700 01/06 0701 - 01/07 0700   P.O. 840    I.V. (mL/kg) 40 (0.4) 20 (0.2)   IV Piggyback 762.9    Total Intake(mL/kg) 1642.9 (16.5) 20 (0.2)   Urine (mL/kg/hr) 1875 (0.8)    Total Output 1875     Net -232.1 +20        Urine Occurrence 3 x    Stool Occurrence 3 x      PHYSICAL EXAMINATION: Gen: no  distress HEENT: nose without purulence, op clear, no LN or TMG Lungs: decreased bs on the right, a few scattered crackles. Cardiovascular: regular, no murmur Abdomen: soft and non-tender Musculoskeletal: No edema Neuro: normal strength, alert and oriented.  LABS:  CBC  Recent Labs Lab 03/13/13 0655 03/14/13 0530 03/15/13 0425  WBC 11.2* 10.0 10.7*  HGB 12.4 12.6 13.4  HCT 38.9 39.7 41.7  PLT 284 240 259   BMET  Recent Labs Lab 03/12/13 0350 03/13/13 0655 03/15/13 0425  NA 135* 136* 136*  K 4.7 4.0 4.4  CL 102 101 100  CO2 22 23 24   BUN 21 23 17   CREATININE 0.73 0.81 0.76  GLUCOSE 168* 98 130*   Electrolytes  Recent Labs Lab 03/12/13 0350 03/13/13 0655 03/15/13 0425  CALCIUM 9.3 9.5 9.2  MG 2.3  --   --   PHOS 2.4  --   --    ABG No results found for this basename: PHART, PCO2ART, PO2ART,  in the last 168 hours Liver Enzymes No results found for this basename: AST, ALT, ALKPHOS, BILITOT, ALBUMIN,  in the last 168 hours Cardiac Enzymes No results found for this basename: TROPONINI, PROBNP,  in the last 168 hours  CRX:  None today  ASSESSMENT / PLAN:  Acute respiratory failure - resolved.  NSCLC. Chemo / radiation therapy MRI brain  R mainstem bronchial obstruction. RML / RLL  lung collapse.  Post obstructive pneumonia. D/c Augmentin ( completed 7 days )  Acute PE/DVT. Lovenox  Suspected COPD. Prednisone 40 Duonev  HTN. Coreg.  Anxietty. Depression. Pain. Wellbutrin Ativan PRN D/c Morphine Start Oxycodeone  Prophylaxis D/c Protonix  Plan to d/c home 1/7.  Richardson Landry Minor ACNP [facility] Shape PCCM Pager [phone] till 3 pm If no answer page [phone] 03/15/2013, 12:06 PM

## 2013-03-15 NOTE — Progress Notes (Signed)
Meagan Garcia has had 8/35 fractions to her right lung.  She denies pain.  She is currently an inpatient.  She reports that her shortness of breath has really improved since last week.  She denies fatigue.  She reports that her food is getting "hung up in her throat."  She also reports occasional burning in her mid chest.  She has lost 16 lbs since 03/12/13.  Her skin is intact in the treatment area.

## 2013-03-16 ENCOUNTER — Ambulatory Visit
Admission: RE | Admit: 2013-03-16 | Discharge: 2013-03-16 | Disposition: A | Discharge status: Home / Self Care | Payer: BC Managed Care – PPO | Source: Ambulatory Visit | Attending: Radiation Oncology | Admitting: Radiation Oncology

## 2013-03-16 LAB — CBC
HEMATOCRIT: 42.8 % (ref 36.0–46.0)
Hemoglobin: 13.6 g/dL (ref 12.0–15.0)
MCH: 27.8 pg (ref 26.0–34.0)
MCHC: 31.8 g/dL (ref 30.0–36.0)
MCV: 87.3 fL (ref 78.0–100.0)
Platelets: 221 10*3/uL (ref 150–400)
RBC: 4.9 MIL/uL (ref 3.87–5.11)
RDW: 15.2 % (ref 11.5–15.5)
WBC: 8.1 10*3/uL (ref 4.0–10.5)

## 2013-03-16 LAB — BASIC METABOLIC PANEL
BUN: 19 mg/dL (ref 6–23)
CHLORIDE: 99 meq/L (ref 96–112)
CO2: 24 mEq/L (ref 19–32)
CREATININE: 0.77 mg/dL (ref 0.50–1.10)
Calcium: 9.5 mg/dL (ref 8.4–10.5)
GFR calc non Af Amer: 89 mL/min — ABNORMAL LOW (ref >90)
Glucose, Bld: 100 mg/dL — ABNORMAL HIGH (ref 70–99)
POTASSIUM: 4.2 meq/L (ref 3.7–5.3)
SODIUM: 135 meq/L — AB (ref 137–147)

## 2013-03-16 MED ORDER — OXYCODONE HCL 5 MG PO TABS: 5.0000 mg | ORAL_TABLET | Freq: Four times a day (QID) | ORAL | Status: DC | PRN

## 2013-03-16 MED ORDER — ALUM & MAG HYDROXIDE-SIMETH 200-200-20 MG/5ML PO SUSP
30.0000 mL | ORAL | Status: DC | PRN
  Administered 2013-03-16: 30 mL via ORAL
  Filled 2013-03-16: qty 30

## 2013-03-16 MED ORDER — PREDNISONE 10 MG PO TABS: ORAL_TABLET | ORAL | Status: DC

## 2013-03-16 MED ORDER — CARVEDILOL 6.25 MG PO TABS: 6.2500 mg | ORAL_TABLET | Freq: Two times a day (BID) | ORAL | Status: DC

## 2013-03-16 MED ORDER — ENOXAPARIN SODIUM 100 MG/ML ~~LOC~~ SOLN: 100.0000 mg | Freq: Two times a day (BID) | SUBCUTANEOUS | Status: DC

## 2013-03-16 MED ORDER — HEPARIN SOD (PORK) LOCK FLUSH 100 UNIT/ML IV SOLN
250.0000 [IU] | INTRAVENOUS | Status: AC | PRN
  Administered 2013-03-16: 250 [IU]

## 2013-03-16 NOTE — Discharge Instructions (Signed)
Please note your Coreg dose has been decreased by half. Care for PICC line as instructed

## 2013-03-16 NOTE — Discharge Summary (Signed)
Physician Discharge Summary  Patient ID: Meagan Garcia MRN: [id number] DOB/AGE: May 13, 1950 63 y.o.  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 Started on RT (Kinard) with goal of starting chemo 12/30 (mohammed) presented with worsening sob & hypoxic resp failure, CT scan showing progressive RML/RLL atelectasis & multiple left-sided  segmental and subsegmental PE  She completed 2 RT treatments under dr Sondra Come, able to lie supine  denies CP, c/o dyspnea x 1 wk  Pt c/o SOB with exertion, had radiation today, O2 saturations 78 on 3 liters of oxygen. she is not on oxygen at home     Hospital Course: SIGNIFICANT EVENTS:  12/29 Admited  12/30 Oncology, radiation oncology consulted  1/6 First chemo  STUDIES:  CT chest 12/29 >> multiple Lt sided segmental/subsegmental PE's, malignant obstruction Rt upper lobe PA, no change Rt suprahilar mass with complete obstruction of Rt mainstem bronchus, small Rt pleural effusion  Doppler legs 12/30 >> Rt peroneal vein DVT  Bronchoscopy 12/31 >> R mainstem occlusion  1-7 MRI brain negative LINES / TUBES:  Rt PICC 1/01 >> 1-7 exchanged for single lumen PICC>> CULTURES:  Sputum 12/31 >>> neg  ANTIBIOTICS:  Cefepime 12/30>> 1/03  Vancomycin 12/30>> 1/03  Augmentin >01/03> 1-7  ASSESSMENT / PLAN:  Acute respiratory failure - resolved.  NSCLC.  Chemo / radiation therapy  MRI brain negative R mainstem bronchial obstruction.  RML / RLL lung collapse.  Post obstructive pneumonia.  D/c Augmentin ( completed 7 days )  Acute PE/DVT.  Lovenox  Suspected COPD.   Prednisone 40 and taper Duonev  HTN.  Coreg.  Anxietty.  Depression.  Pain. Wellbutrin  Ativan PRN  D/c Morphine  Start Oxycodone        Labs at discharge Lab Results  Component Value Date   CREATININE 0.77 03/16/2013   BUN 19 03/16/2013   NA 135* 03/16/2013   K 4.2 03/16/2013   CL 99 03/16/2013   CO2 24 03/16/2013   Lab Results  Component Value Date   WBC 8.1 03/16/2013   HGB 13.6 03/16/2013   HCT 42.8 03/16/2013   MCV 87.3 03/16/2013   PLT 221 03/16/2013   Lab Results  Component Value Date   ALT 89* 03/07/2013   AST 45* 03/07/2013   ALKPHOS 150* 03/07/2013   BILITOT 0.8 03/07/2013   Lab Results  Component Value Date   INR 1.12 02/20/2013    Current radiology studies Mr Meagan Garcia SE Contrast  03/15/2013   CLINICAL DATA:  New diagnosis lung cancer. Evaluate for brain metastases.  EXAM: MRI HEAD WITHOUT AND WITH CONTRAST  TECHNIQUE: Multiplanar, multiecho pulse sequences of the brain and surrounding structures were obtained without and with intravenous contrast.  CONTRAST:  64mL MULTIHANCE GADOBENATE DIMEGLUMINE 529 MG/ML IV SOLN  COMPARISON:  None.  FINDINGS: Bone marrow signal in the visualized upper cervical spine is mildly heterogeneous with diminished T1 signal noted, particularly in the dens.  There is no evidence of acute infarct. There are a few small foci of T2 hyperintensity within the white matter of the frontal lobes, nonspecific but may reflect minimal chronic small vessel ischemic disease. Ventricles and sulci  are within normal limits for age. There is no mass, midline shift, intracranial hemorrhage, or extra-axial fluid collection. There is no abnormal enhancement. Calvarium is normal in signal. Major intracranial vascular flow voids are unremarkable. Orbits are normal. Paranasal sinuses and mastoid air cells are clear.  IMPRESSION: 1. No evidence of intracranial metastases. 2. Mildly heterogeneous and diminished bone marrow signal in the visualized upper cervical spine,  nonspecific. This may be degenerative in nature, although metastatic disease cannot be completely excluded.   Electronically Signed   By: Logan Bores   On: 03/15/2013 17:55    Disposition:  01-Home or Self Care  Discharge Orders   Future Appointments Provider Department Dept Phone   03/17/2013 11:05 AM [facility] [place] [phone]   03/18/2013 11:10 AM [facility] [place] [phone]   03/21/2013 11:00 AM [facility] [place] [facility] [phone]   03/21/2013 11:15 AM [facility] [place] [facility] [phone]   03/21/2013 11:45 AM Carlton Adam, PA-C [place] [phone]   03/21/2013 12:45 PM [place] [facility] [phone]   03/22/2013 11:10 AM [facility] [place] [facility] [phone]   03/23/2013 11:10 AM [facility] [place] [facility] [phone]   03/23/2013 1:45 PM Tanda Rockers, MD [facility] [phone]   03/24/2013 11:00 AM [facility] [place] [facility] [phone]   03/25/2013 11:00 AM [facility] [place] [facility] [phone]   03/28/2013 10:15 AM [facility] [place] [facility] [phone]   03/28/2013 11:00 AM [facility] [phone]   03/28/2013 2:30 PM [place] [facility] [phone]   03/29/2013 11:00 AM [id number] [phone]   03/30/2013 11:00 AM [facility] [place] [facility] [phone]   03/31/2013 11:00 AM [facility] [place] [phone]   04/01/2013 11:00 AM [facility] [place] [facility] [phone]   04/04/2013 10:45 AM [facility] [place] [facility] [phone]   04/04/2013 11:00 AM [facility] [place] [facility] [phone]   04/04/2013 11:30 AM [facility] [place] [facility] [phone]   04/05/2013 11:00 AM [facility] [place] [phone]   04/06/2013 9:00 AM Cassandria Anger, MD [facility] [place] [phone]   04/06/2013 11:00 AM [facility] [place] [employer] [phone]   04/07/2013 11:00 AM [facility] [place] [facility] [phone]   04/08/2013 11:00 AM [facility] [place] [facility] [phone]   04/11/2013 11:00 AM [facility] [place] [phone]   04/12/2013 11:00 AM [facility] [place] [phone]   04/13/2013 11:00 AM [facility] [place] [phone]   04/14/2013 11:00 AM [facility] [place] [phone]   04/15/2013 11:00 AM [facility] [place] [phone]   04/18/2013 11:00 AM [facility] [place] [phone]   04/19/2013 11:00 AM [facility] [place] [phone]   04/20/2013 11:00 AM [facility] [place] [phone]  04/21/2013 11:00 AM [facility] [place] [facility] [phone]   Future Orders Complete By Expires   Discharge patient  As directed    Comments:     Discharge after PICC line  converted to single lumen.       Medication List    STOP taking these medications       spironolactone 25 MG tablet  Commonly known as:  ALDACTONE      TAKE these medications       albuterol 108 (90 BASE) MCG/ACT inhaler  Commonly known as:  PROVENTIL HFA;VENTOLIN HFA  Inhale 2 puffs into the lungs every 6 (six) hours as needed for wheezing or shortness of breath.     buPROPion 150 MG 12 hr tablet  Commonly known as:  WELLBUTRIN SR  Take 150 mg by mouth daily.     carvedilol 6.25 MG tablet  Commonly known as:  COREG  Take 1 tablet (6.25 mg total) by mouth 2 (two) times daily with a meal.     chlorpheniramine-HYDROcodone 10-8 MG/5ML Lqcr  Commonly known as:  TUSSIONEX PENNKINETIC ER  Take 5 mLs by mouth every 12 (twelve) hours as needed.     cholecalciferol 1000 UNITS tablet  Commonly known as:  VITAMIN D  Take 1 tablet (1,000 Units total) by mouth daily.     enoxaparin 100 MG/ML injection  Commonly known as:  LOVENOX  Inject 1 mL (100 mg total) into the skin every 12 (twelve) hours.     fluticasone 50 MCG/ACT nasal spray  Commonly known as:  FLONASE  Place 2 sprays into the nose daily.     hyaluronate sodium Gel  Apply 1 application topically 2 (two) times daily. Apply to chest area being treated with radiation after rad tx and bedtime when skin becomes itchy or irritation/erythema  And on weekedns as well     loratadine 10 MG tablet  Commonly known as:  CLARITIN  Take 1 tablet (10 mg total) by mouth daily.     LORazepam 1 MG tablet  Commonly known as:  ATIVAN  TAKE ONE TABLET BY MOUTH TWICE DAILY     oxyCODONE 5 MG immediate release tablet  Commonly known as:  Oxy IR/ROXICODONE  Take 1 tablet (5 mg total) by mouth every 6 (six) hours as needed for severe pain.     predniSONE 10 MG tablet  Commonly known as:  DELTASONE  Take 4 tabs  daily with food x 4 days, then 3 tabs daily x 4 days, then 2 tabs daily x 4 days, then 1 tab daily x4 days then stop. #40      prochlorperazine 10 MG tablet  Commonly known as:  COMPAZINE  Take 1 tablet (10 mg total) by mouth every 6 (six) hours as needed for nausea or vomiting.     tiotropium 18 MCG inhalation capsule  Commonly known as:  SPIRIVA HANDIHALER  Place 1 capsule (18 mcg total) into inhaler and inhale daily.           Follow-up Information   Follow up with Christinia Gully, MD On 03/23/2013. (1:45 pm)    Specialty:  Pulmonary Disease   Contact information:   [address] [place] [address] [phone]       Follow up with Eilleen Kempf., MD. (make and appointment for near future.)    Specialty:  Oncology   Contact information:   [address] [place] [address] [phone]  Follow up with [name], MD. (See in next two weeks)    Specialty:  Internal Medicine   Contact information:   [address] [place] [address] [phone]        Discharged Condition: fair  Time spent on discharge greater than 60 minutes.  Vital signs at Discharge. Temp:  [97.4 F (36.3 C)-98.1 F (36.7 C)] 98.1 F (36.7 C) (01/07 0507) Pulse Rate:  [96-112] 112 (01/07 0507) Resp:  [16] 16 (01/07 0507) BP: (117-127)/(61-74) 117/74 mmHg (01/07 0507) SpO2:  [92 %-95 %] 93 % (01/07 0737) Office follow up Special Information or instructions.  She will follow up with Dr. Melvyn Novas for treatment of COPD and follow for treatment of lung cancer. Taper prednisone, she has completed abx. Receiving chemotherapy and RTX therapy as outpatient.  Signed: Richardson Landry Minor ACNP [facility] PCCM Pager [phone] till 3 pm If no answer page [phone] 03/16/2013, 1:26 PM  STAFF NOTE Home today   Dr. Brand Males, M.D., [facility].C.P Pulmonary and Critical Care Medicine Staff Physician [place] [facility] Pager: [phone], If no answer or between  15:00h - 7:00h: call [phone]  03/16/2013 2:52 PM

## 2013-03-16 NOTE — Progress Notes (Signed)
Patient c/o upset stomach and nausea. Called [phone] and spoke to MD on call. Orders received for maalox.

## 2013-03-16 NOTE — Plan of Care (Signed)
Problem: Phase II Progression Outcomes Goal: Dyspnea controlled w/progressive activity Outcome: Progressing Still SOB with exertion

## 2013-03-16 NOTE — Plan of Care (Signed)
Problem: Discharge Progression Outcomes Goal: Activity appropriate for discharge plan Outcome: Completed/Met Date Met:  03/16/13 Ambulated in hallway around 2030 on 1/6 with family

## 2013-03-16 NOTE — Progress Notes (Signed)
Patient was stable at time of discharge. Reviewed discharge education with patient and husband. Their questions about the prednisone regimen, heparin flushes for PICC, & coreg dose changes were clarified and they verbalized understanding.  Prescriptions were given to family.

## 2013-03-16 NOTE — Plan of Care (Signed)
Problem: Discharge Progression Outcomes Goal: Tolerating diet Outcome: Progressing C/o soreness in throat. Needs carafate ordered.

## 2013-03-17 ENCOUNTER — Ambulatory Visit
Admission: RE | Admit: 2013-03-17 | Discharge: 2013-03-17 | Disposition: A | Discharge status: Home / Self Care | Payer: BC Managed Care – PPO | Source: Ambulatory Visit | Attending: Radiation Oncology | Admitting: Radiation Oncology

## 2013-03-18 ENCOUNTER — Other Ambulatory Visit: Payer: Self-pay | Admitting: *Deleted

## 2013-03-18 ENCOUNTER — Ambulatory Visit
Admission: RE | Admit: 2013-03-18 | Discharge: 2013-03-18 | Disposition: A | Discharge status: Home / Self Care | Payer: BC Managed Care – PPO | Source: Ambulatory Visit | Attending: Radiation Oncology | Admitting: Radiation Oncology

## 2013-03-18 ENCOUNTER — Ambulatory Visit (HOSPITAL_BASED_OUTPATIENT_CLINIC_OR_DEPARTMENT_OTHER): Payer: BC Managed Care – PPO

## 2013-03-18 ENCOUNTER — Telehealth: Payer: Self-pay | Admitting: Oncology

## 2013-03-18 ENCOUNTER — Telehealth: Payer: Self-pay | Admitting: *Deleted

## 2013-03-18 VITALS — BP 145/71 | HR 110 | Temp 97.5°F

## 2013-03-18 VITALS — BP 128/75 | HR 98 | Temp 97.5°F | Ht 65.0 in | Wt 209.2 lb

## 2013-03-18 DIAGNOSIS — C3400 Malignant neoplasm of unspecified main bronchus: Secondary | ICD-10-CM

## 2013-03-18 DIAGNOSIS — C3491 Malignant neoplasm of unspecified part of right bronchus or lung: Secondary | ICD-10-CM

## 2013-03-18 DIAGNOSIS — Z452 Encounter for adjustment and management of vascular access device: Secondary | ICD-10-CM

## 2013-03-18 MED ORDER — HEPARIN SOD (PORK) LOCK FLUSH 100 UNIT/ML IV SOLN
500.0000 [IU] | Freq: Once | INTRAVENOUS | Status: AC
  Administered 2013-03-18: 250 [IU] via INTRAVENOUS
  Filled 2013-03-18: qty 5

## 2013-03-18 MED ORDER — SODIUM CHLORIDE 0.9 % IJ SOLN
10.0000 mL | INTRAMUSCULAR | Status: DC | PRN
  Administered 2013-03-18: 10 mL via INTRAVENOUS
  Filled 2013-03-18: qty 10

## 2013-03-18 NOTE — Patient Instructions (Signed)
Peripherally Inserted Central Catheter (PICC) Home Guide A peripherally inserted central catheter (PICC) is a long, thin, flexible tube that is inserted into a vein in the upper arm. It is a form of intravenous (IV) access. It is considered to be a "central" line because the tip of the PICC ends in a large vein in your chest. This large vein is called the superior vena cava (SVC). The PICC tip ends in the SVC because there is a lot of blood flow in the SVC. This allows medicines and IV fluids to be quickly distributed throughout the body. The PICC is inserted using a sterile technique by a specially trained nurse or physician. After the PICC is inserted, a chest X-ray is done to be sure it is in the correct place.  A PICC may be placed for different reasons, such as:  To give medicines and liquid nutrition that can only be given through a central line. Examples are:  Certain antibiotic treatments.  Chemotherapy.  Total parenteral nutrition (TPN).  To take frequent blood samples.  To give IV fluids and blood products.  If there is difficulty placing a peripheral intravenous (PIV) catheter. If taken care of properly, a PICC can remain in place for several months. A PICC can also allow patients to go home early. Medicine and PICC care can be managed at home by a family member or home healthcare team. RISKS AND COMPLICATIONS Possible problems with a PICC can occasionally occur. This may include:  A clot (thrombus) forming in or at the tip of the PICC. This can cause the PICC to become clogged. A "clot-busting" medicine called tissue plasminogen activator (tPA) can be inserted into the PICC to help break up the clot.  Inflammation of the vein (phlebitis) in which the PICC is placed. Signs of inflammation may include redness, pain at the insertion site, red streaks, or being able to feel a "cord" in the vein where the PICC is located.  Infection in the PICC or at the insertion site. Signs of  infection may include fever, chills, redness, swelling, or pus drainage from the PICC insertion site.  PICC movement (malposition). The PICC tip may migrate from its original position due to excessive physical activity, forceful coughing, sneezing, or vomiting.  A break or cut in the PICC. It is important to not use scissors near the PICC.  Nerve or tendon irritation or injury during PICC insertion. HOME CARE INSTRUCTIONS Activity  You may bend your arm and move it freely. If your PICC is near or at the bend of your elbow, avoid activity with repeated motion at the elbow.  Avoid lifting heavy objects as instructed by your caregiver.  Avoid using a crutch with the arm on the same side as your PICC. You may need to use a walker. PICC Dressing  Keep your PICC bandage (dressing) clean and dry to prevent infection.  Ask your caregiver when you may shower. Ask your caregiver to teach you how to wrap the PICC when you do take a shower.  Do not bathe, swim, or use hot tubs when you have a PICC.  Change the PICC dressing as instructed by your caregiver.  Change your PICC dressing if it becomes loose or wet. General PICC Care  Check the PICC insertion site daily for leakage, redness, swelling, or pain.  Flush the PICC as directed by your caregiver. Let your caregiver know right away if the PICC is difficult to flush or does not flush. Do not use force   to flush the PICC.  Do not use a syringe that is less than 10 mLs to flush the PICC.  Never pull or tug on the PICC.  Avoid blood pressure checks on the arm with the PICC.  Keep your PICC identification card with you at all times.  Do not take the PICC out yourself. Only a trained clinical professional should remove the PICC. SEEK IMMEDIATE MEDICAL CARE IF:  Your PICC is accidently pulled all the way out. If this happens, cover the insertion site with a bandage or gauze dressing. Do not throw the PICC away. Your caregiver will need to  inspect it.  Your PICC was tugged or pulled and has partially come out. Do not  push the PICC back in.  There is any type of drainage, redness, or swelling where the PICC enters the skin.  You cannot flush the PICC, it is difficult to flush, or the PICC leaks around the insertion site when it is flushed.  You hear a "flushing" sound when the PICC is flushed.  You have pain, discomfort, or numbness in your arm, shoulder, or jaw on the same side as the PICC .  You feel your heart "racing" or skipping beats.  You notice a hole or tear in the PICC.  You develop chills or a fever. MAKE SURE YOU:   Understand these instructions.  Will watch your condition.  Will get help right away if you are not doing well or get worse. Document Released: 08/31/2002 Document Revised: 05/19/2011 Document Reviewed: 07/01/2010 Exit[employer] Patient Information 2014 [employer].  

## 2013-03-18 NOTE — Telephone Encounter (Signed)
Spoke with Serafina Royals, PT yesterday regarding pt.  We decided a follow up on how she was feeling was appropriate.  I called pt today.  We spoke about follow with PT.  Pt would like to try PT, she felt like it may help.  I will speak with Dr. Julien Nordmann and make referral

## 2013-03-18 NOTE — Telephone Encounter (Signed)
PT. HAS COUGHED UP BRIGHT RED BLOOD THREE TIMES. DR.WENTWORTH WOULD LIKE DR.MOHAMED TO CALL HER. DR.WENTWORTH'S EXTENSION #[id number] WAS GIVEN TO DR.MOHAMED. HE STATES WILL CALL [place]. NOTIFIED KAREN.

## 2013-03-18 NOTE — Telephone Encounter (Signed)
Called Meagan Garcia and let her know to not give herself lovenox today and to restart it tomorrow on her regular schedule.  Advised her that if she is having a large amount of blood to go to the ER or to call the Med Onc doctor on call for Dr. Earlie Server.

## 2013-03-18 NOTE — Progress Notes (Signed)
Weekly Management Note Current Dose:19.8 Gy  Projected Dose:63 Gy   Narrative:  The patient presents for routine under treatment assessment.  CBCT/MVCT images/Port film x-rays were reviewed.  The chart was checked. Pt asked to be seen for hemoptysis.  3 episodes ysterday and 2 this morning. Bright red blood with small amout of mucous when she coughs.  Taking Lovenox for PE.  Prior to Lovenox (started in hospital), was coughing up some dark clots but now is more bright red. No chest pain. No dizziness or fainting.   Physical Findings:  Unchanged  Vitals:  Filed Vitals:   03/18/13 1143  BP: 128/75  Pulse: 98  Temp: 97.5 F (36.4 C)   Weight:  Wt Readings from Last 3 Encounters:  03/18/13 209 lb 3.2 oz (94.892 kg)  03/12/13 220 lb (99.791 kg)  03/15/13 206 lb 14.4 oz (93.849 kg)   Lab Results  Component Value Date   WBC 8.1 03/16/2013   HGB 13.6 03/16/2013   HCT 42.8 03/16/2013   MCV 87.3 03/16/2013   PLT 221 03/16/2013   Lab Results  Component Value Date   CREATININE 0.77 03/16/2013   BUN 19 03/16/2013   NA 135* 03/16/2013   K 4.2 03/16/2013   CL 99 03/16/2013   CO2 24 03/16/2013     Impression:  New hemoptysis. Stable vitals.  Plan:  Continue treatment as planned. Discussed with Dr. Julien Nordmann. Hold Lovenox today and restart tomorrow. If large amounts of blood call med onc on call or come to ER.

## 2013-03-18 NOTE — Progress Notes (Addendum)
Meagan Garcia presents to the clinic stating she has been coughing up blood.  She has had 10 fractions to her right lung.  She says it started late yesterday.  It happened twice during the night and this morning after radiation.  She reports that she was coughing up brown sputum.  She is on Lovenox bid.  She denies shortness of breath.  She has an oxygen saturation of 97% on room air.  She has audible wheezes/gurgling.  Patient states she has not used her albuterol inhaler.  She denies pain and in fact feels better today.  She is on a prednisone taper and is currently taking 40 mg a day.

## 2013-03-20 NOTE — Progress Notes (Signed)
  Radiation Oncology         [phone] ________________________________  Name: Meagan Garcia MRN: [id number]  Date: 03/18/2013  DOB: 1951-02-14  Simulation Verification Note  Status: outpatient  NARRATIVE: The patient was brought to the treatment unit and placed in the planned treatment position. The clinical setup was verified. Then port films were obtained and uploaded to the radiation oncology medical record software.  The treatment beams were carefully compared against the planned radiation fields. The position location and shape of the radiation fields was reviewed. They targeted volume of tissue appears to be appropriately covered by the radiation beams. Organs at risk appear to be excluded as planned.  Based on my personal review, I approved the simulation verification. The patient's treatment will proceed as planned.  -----------------------------------  Blair Promise, PhD, MD

## 2013-03-21 ENCOUNTER — Ambulatory Visit
Admission: RE | Admit: 2013-03-21 | Discharge: 2013-03-21 | Disposition: A | Discharge status: Home / Self Care | Payer: BC Managed Care – PPO | Source: Ambulatory Visit | Attending: Radiation Oncology | Admitting: Radiation Oncology

## 2013-03-21 ENCOUNTER — Ambulatory Visit (HOSPITAL_BASED_OUTPATIENT_CLINIC_OR_DEPARTMENT_OTHER): Payer: BC Managed Care – PPO | Admitting: Physician Assistant

## 2013-03-21 ENCOUNTER — Other Ambulatory Visit (HOSPITAL_BASED_OUTPATIENT_CLINIC_OR_DEPARTMENT_OTHER): Payer: BC Managed Care – PPO

## 2013-03-21 ENCOUNTER — Encounter: Payer: Self-pay | Admitting: Physician Assistant

## 2013-03-21 ENCOUNTER — Ambulatory Visit (HOSPITAL_BASED_OUTPATIENT_CLINIC_OR_DEPARTMENT_OTHER): Payer: BC Managed Care – PPO

## 2013-03-21 ENCOUNTER — Telehealth: Payer: Self-pay | Admitting: Internal Medicine

## 2013-03-21 ENCOUNTER — Telehealth: Payer: Self-pay | Admitting: *Deleted

## 2013-03-21 VITALS — BP 130/53 | HR 105 | Temp 98.3°F | Resp 18 | Ht 65.0 in | Wt 212.4 lb

## 2013-03-21 DIAGNOSIS — Z5111 Encounter for antineoplastic chemotherapy: Secondary | ICD-10-CM

## 2013-03-21 DIAGNOSIS — C3400 Malignant neoplasm of unspecified main bronchus: Secondary | ICD-10-CM

## 2013-03-21 DIAGNOSIS — R062 Wheezing: Secondary | ICD-10-CM

## 2013-03-21 DIAGNOSIS — C3491 Malignant neoplasm of unspecified part of right bronchus or lung: Secondary | ICD-10-CM

## 2013-03-21 DIAGNOSIS — I2699 Other pulmonary embolism without acute cor pulmonale: Secondary | ICD-10-CM

## 2013-03-21 DIAGNOSIS — J9801 Acute bronchospasm: Secondary | ICD-10-CM

## 2013-03-21 DIAGNOSIS — Z7901 Long term (current) use of anticoagulants: Secondary | ICD-10-CM

## 2013-03-21 LAB — CBC WITH DIFFERENTIAL/PLATELET
BASO%: 0.1 % (ref 0.0–2.0)
Basophils Absolute: 0 10*3/uL (ref 0.0–0.1)
EOS%: 0.1 % (ref 0.0–7.0)
Eosinophils Absolute: 0 10*3/uL (ref 0.0–0.5)
HEMATOCRIT: 36.2 % (ref 34.8–46.6)
HEMOGLOBIN: 11.5 g/dL — AB (ref 11.6–15.9)
LYMPH%: 4.1 % — ABNORMAL LOW (ref 14.0–49.7)
MCH: 27.8 pg (ref 25.1–34.0)
MCHC: 31.8 g/dL (ref 31.5–36.0)
MCV: 87.4 fL (ref 79.5–101.0)
MONO#: 0.6 10*3/uL (ref 0.1–0.9)
MONO%: 5.8 % (ref 0.0–14.0)
NEUT#: 8.9 10*3/uL — ABNORMAL HIGH (ref 1.5–6.5)
NEUT%: 89.9 % — AB (ref 38.4–76.8)
Platelets: 153 10*3/uL (ref 145–400)
RBC: 4.14 10*6/uL (ref 3.70–5.45)
RDW: 15.2 % — ABNORMAL HIGH (ref 11.2–14.5)
WBC: 9.9 10*3/uL (ref 3.9–10.3)
lymph#: 0.4 10*3/uL — ABNORMAL LOW (ref 0.9–3.3)

## 2013-03-21 LAB — COMPREHENSIVE METABOLIC PANEL (CC13)
ALT: 101 U/L — ABNORMAL HIGH (ref 0–55)
ANION GAP: 11 meq/L (ref 3–11)
AST: 24 U/L (ref 5–34)
Albumin: 2.9 g/dL — ABNORMAL LOW (ref 3.5–5.0)
Alkaline Phosphatase: 99 U/L (ref 40–150)
BUN: 11.3 mg/dL (ref 7.0–26.0)
CO2: 22 mEq/L (ref 22–29)
CREATININE: 0.7 mg/dL (ref 0.6–1.1)
Calcium: 9.5 mg/dL (ref 8.4–10.4)
Chloride: 102 mEq/L (ref 98–109)
Glucose: 132 mg/dl (ref 70–140)
Potassium: 4 mEq/L (ref 3.5–5.1)
Sodium: 135 mEq/L — ABNORMAL LOW (ref 136–145)
TOTAL PROTEIN: 6.4 g/dL (ref 6.4–8.3)
Total Bilirubin: 0.43 mg/dL (ref 0.20–1.20)

## 2013-03-21 MED ORDER — ONDANSETRON 16 MG/50ML IVPB (CHCC)
16.0000 mg | Freq: Once | INTRAVENOUS | Status: AC
  Administered 2013-03-21: 16 mg via INTRAVENOUS

## 2013-03-21 MED ORDER — FAMOTIDINE IN NACL 20-0.9 MG/50ML-% IV SOLN
20.0000 mg | Freq: Once | INTRAVENOUS | Status: AC
  Administered 2013-03-21: 20 mg via INTRAVENOUS

## 2013-03-21 MED ORDER — HEPARIN SOD (PORK) LOCK FLUSH 100 UNIT/ML IV SOLN
500.0000 [IU] | Freq: Once | INTRAVENOUS | Status: AC | PRN
  Administered 2013-03-21: 250 [IU]
  Filled 2013-03-21: qty 5

## 2013-03-21 MED ORDER — SODIUM CHLORIDE 0.9 % IJ SOLN
10.0000 mL | INTRAMUSCULAR | Status: DC | PRN
  Administered 2013-03-21: 10 mL
  Filled 2013-03-21: qty 10

## 2013-03-21 MED ORDER — DIPHENHYDRAMINE HCL 50 MG/ML IJ SOLN
50.0000 mg | Freq: Once | INTRAMUSCULAR | Status: AC
  Administered 2013-03-21: 50 mg via INTRAVENOUS

## 2013-03-21 MED ORDER — SODIUM CHLORIDE 0.9 % IV SOLN
Freq: Once | INTRAVENOUS | Status: AC
  Administered 2013-03-21: 14:00:00 via INTRAVENOUS

## 2013-03-21 MED ORDER — FAMOTIDINE IN NACL 20-0.9 MG/50ML-% IV SOLN
INTRAVENOUS | Status: AC
  Filled 2013-03-21: qty 50

## 2013-03-21 MED ORDER — ALBUTEROL SULFATE (2.5 MG/3ML) 0.083% IN NEBU
2.5000 mg | INHALATION_SOLUTION | Freq: Once | RESPIRATORY_TRACT | Status: AC
  Administered 2013-03-21: 2.5 mg via RESPIRATORY_TRACT
  Filled 2013-03-21: qty 3

## 2013-03-21 MED ORDER — DIPHENHYDRAMINE HCL 50 MG/ML IJ SOLN
INTRAMUSCULAR | Status: AC
  Filled 2013-03-21: qty 1

## 2013-03-21 MED ORDER — DEXAMETHASONE SODIUM PHOSPHATE 20 MG/5ML IJ SOLN
INTRAMUSCULAR | Status: AC
  Filled 2013-03-21: qty 5

## 2013-03-21 MED ORDER — SODIUM CHLORIDE 0.9 % IV SOLN
45.0000 mg/m2 | Freq: Once | INTRAVENOUS | Status: AC
  Administered 2013-03-21: 96 mg via INTRAVENOUS
  Filled 2013-03-21: qty 16

## 2013-03-21 MED ORDER — DEXAMETHASONE SODIUM PHOSPHATE 20 MG/5ML IJ SOLN
20.0000 mg | Freq: Once | INTRAMUSCULAR | Status: AC
  Administered 2013-03-21: 20 mg via INTRAVENOUS

## 2013-03-21 MED ORDER — ONDANSETRON 16 MG/50ML IVPB (CHCC)
INTRAVENOUS | Status: AC
  Filled 2013-03-21: qty 16

## 2013-03-21 MED ORDER — CARBOPLATIN CHEMO INJECTION 450 MG/45ML
276.0000 mg | Freq: Once | INTRAVENOUS | Status: AC
  Administered 2013-03-21: 280 mg via INTRAVENOUS
  Filled 2013-03-21: qty 28

## 2013-03-21 NOTE — Telephone Encounter (Signed)
THERE WAS A DARK RED CLOT THEN BRIGHT RED BLOOD FOR FIVE MINUTES. INSTRUCTED PT. TO APPLY PRESSURE TO HER NOSE WITH A COLD MOIST CLOTH. PT. IS ON LOVENOX 100MG  EVERY 12 HOURS. INSTRUCTED PT. NOT TO TAKE THE LOVENOX. SHE VOICES UNDERSTANDING. PT. ALSO HAS AN APPOINTMENT TODAY FOR LAB AND SEE ADRENA JOHNSON,PA. THE NOSE BLEED FLOW IS SLOWING DOWN. VERBAL ORDER AND READ BACK TO DR.MOHAMED- CONTINUE THE ABOVE INSTRUCTIONS. KEEP SCHEDULED APPOINTMENT TODAY. NOTIFIED PT. OF DR.MOHAMED'S ORDERS. CONFIRMED PT.'S APPOINTMENT TIMES FOR LAB AT 11:15AM AND SEE ADRENA JOHNSON,PA AT 11:45AM. PT. VOICES UNDERSTANDING.

## 2013-03-21 NOTE — Patient Instructions (Signed)
[employer] Discharge Instructions for Patients Receiving Chemotherapy  Today you received the following chemotherapy agents: Taxol, Carboplatin  To help prevent nausea and vomiting after your treatment, we encourage you to take your nausea medication as prescribed.   If you develop nausea and vomiting that is not controlled by your nausea medication, call the clinic.   BELOW ARE SYMPTOMS THAT SHOULD BE REPORTED IMMEDIATELY:  *FEVER GREATER THAN 100.5 F  *CHILLS WITH OR WITHOUT FEVER  NAUSEA AND VOMITING THAT IS NOT CONTROLLED WITH YOUR NAUSEA MEDICATION  *UNUSUAL SHORTNESS OF BREATH  *UNUSUAL BRUISING OR BLEEDING  TENDERNESS IN MOUTH AND THROAT WITH OR WITHOUT PRESENCE OF ULCERS  *URINARY PROBLEMS  *BOWEL PROBLEMS  UNUSUAL RASH Items with * indicate a potential emergency and should be followed up as soon as possible.  Feel free to call the clinic you have any questions or concerns. The clinic phone number is [phone].

## 2013-03-21 NOTE — Patient Instructions (Addendum)
Subcutaneous Injection Using a Syringe A subcutaneous (SQ) injection is an injection of medicine given into the layer between the skin and the muscle. A needle is attached to a syringe with medicine in it. Some syringes will have a needle that is permanently attached and cannot be removed. SUPPLIES YOU WILL NEED:  Syringe.  Medicine prescribed by your caregiver.  Alcohol wipes.  Puncture proof sharps disposal container. HOW TO GIVE YOUR INJECTION  1. Wash your hands with soap and water. 2. Choose the place where you are going to give your injection. Give your injection in a different spot each time. You can give an injection in your abdomen, thigh, or the back of the arm. 3. Use an alcohol wipe to clean the area of the body to be injected. 4. Remove the plastic cover off of the needle. 5.  Pinch up about 1 inch of skin and hold it. 6. Put the needle straight into the skin (90-degree angle). Put the needle in as far as it will go (to the hub). The needle may need to be injected at a 45-degree angle in small adults or children with little fat. 7. When the needle is in, you can let go of your skin. 8. Push the plunger all the way down to inject the medicine. 9. Pull the needle straight out of your skin. 10. Press the alcohol wipe over the place where you gave your injection. Hold it there for a few seconds. Do not rub the area. 11. Do not put the plastic cover back on the needle. THROWING AWAY SUPPLIES Place your syringe and needle in a puncture proof sharps disposal container. Follow the disposal regulations for the area where you live. Document Released: 11/07/2010 Document Revised: 05/19/2011 Document Reviewed: 11/07/2010 [facility] Patient Information 2014 [place].  Continue her course of concurrent chemoradiation as scheduled Followup in 2 weeks

## 2013-03-21 NOTE — Telephone Encounter (Signed)
gv and printed appt sched an davs for pt for Jan.. °

## 2013-03-21 NOTE — Progress Notes (Addendum)
No images are attached to the encounter. No scans are attached to the encounter. No scans are attached to the encounter. [place] NOTE  [name], Meagan Garcia [facility] [place] [address]  DIAGNOSIS: Non-small cell cancer of right lung   Primary site: Lung (Right)   Staging method: AJCC 7th Edition   Clinical: Stage IIIB (T2b, N3, M0)   Summary: Stage IIIB (T2b, N3, M0)  PRIOR THERAPY: None  CURRENT THERAPY: Concurrent chemoradiation with weekly carboplatin for an AUC of 2 and paclitaxel at 45 mg per meter square for total of 6-7 weeks depending on the final dose of radiation  DISEASE STAGE: Non-small cell cancer of right lung   Primary site: Lung (Right)   Staging method: AJCC 7th Edition   Clinical: Stage IIIB (T2b, N3, M0)   Summary: Stage IIIB (T2b, N3, M0)  CHEMOTHERAPY INTENT: control/palliative  CURRENT # OF CHEMOTHERAPY CYCLES: 1  CURRENT ANTIEMETICS: Dexamethasone, Zofran, Compazine  CURRENT SMOKING STATUS: Former smoker, quit 02/19/1996  ORAL CHEMOTHERAPY AND CONSENT: n/a  CURRENT BISPHOSPHONATES USE: None  PAIN MANAGEMENT: oxycodone  NARCOTICS INDUCED CONSTIPATION: nine  LIVING WILL AND CODE STATUS: ?   INTERVAL HISTORY: Meagan Garcia 63 y.o. 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 Patient called the office this this morning complaining of a nosebleed that lasted approximately 20 minutes. She was able to apply direct pressure with resolution. She did not have any further episodes. She held her Lovenox injection and she was instructed this morning. She was diagnosed with a pulmonary embolism 03/07/2013 the CT angiography. The clot burden was described as small. She's been on Lovenox at 100 mg subcutaneously every 12 hours since  that time. She also reports cough that is productive of clear to bloody secretions. The bloody secretions can be upwards of 2 tablespoons to approximately 2 ounces in 24 hours or slightly more. She feels that she has less secretion production right after radiation therapy and more secretion production in the evenings. She denies any fever or chills. She is having problems with significant wheezing. She is currently on Spiriva as well as an albuterol inhaler. She states that she took 2 puffs of her albuterol inhaler 5 minutes prior to being placed in the examination room today. She also reports some constipation. She states this is been a long-standing problem and she takes over-the-counter stool softeners.  MEDICAL HISTORY: Past Medical History  Diagnosis Date  . Anxiety   . Depression   . Peripheral neuropathy     feet: unknown etiol  . Elevated glucose   . Allergic rhinitis   . GERD (gastroesophageal reflux disease)   . Complication of anesthesia w/tubal    got anest. "went crazy"   . Non-small cell lung cancer     ALLERGIES:  has No Known Allergies.  MEDICATIONS:  Current Outpatient Prescriptions  Medication Sig Dispense Refill  . albuterol (PROVENTIL HFA;VENTOLIN HFA) 108 (90 BASE) MCG/ACT inhaler Inhale 2 puffs into the lungs every 6 (six) hours as needed for wheezing or shortness of breath.  1 Inhaler  2  . buPROPion (WELLBUTRIN SR) 150 MG 12 hr tablet Take 150 mg by mouth daily.      . carvedilol (COREG) 6.25 MG tablet Take 1 tablet (6.25 mg total) by mouth 2 (two) times daily  with a meal.      . cholecalciferol (VITAMIN D) 1000 UNITS tablet Take 1 tablet (1,000 Units total) by mouth daily.  30 tablet  0  . enoxaparin (LOVENOX) 100 MG/ML injection Inject 1 mL (100 mg total) into the skin every 12 (twelve) hours.  20 Syringe  4  . LORazepam (ATIVAN) 1 MG tablet TAKE ONE TABLET BY MOUTH TWICE DAILY  180 tablet  0  . predniSONE (DELTASONE) 10 MG tablet Take 4 tabs  daily with food x 4  days, then 3 tabs daily x 4 days, then 2 tabs daily x 4 days, then 1 tab daily x4 days then stop. #40  40 tablet  0  . prochlorperazine (COMPAZINE) 10 MG tablet Take 1 tablet (10 mg total) by mouth every 6 (six) hours as needed for nausea or vomiting.  60 tablet  0  . tiotropium (SPIRIVA HANDIHALER) 18 MCG inhalation capsule Place 1 capsule (18 mcg total) into inhaler and inhale daily.  30 capsule  0  . chlorpheniramine-HYDROcodone (TUSSIONEX PENNKINETIC ER) 10-8 MG/5ML LQCR Take 5 mLs by mouth every 12 (twelve) hours as needed.  115 mL  0  . fluticasone (FLONASE) 50 MCG/ACT nasal spray Place 2 sprays into the nose daily.  48 g  3  . hyaluronate sodium (RADIAPLEXRX) GEL Apply 1 application topically 2 (two) times daily. Apply to chest area being treated with radiation after rad tx and bedtime when skin becomes itchy or irritation/erythema  And on weekedns as well      . loratadine (CLARITIN) 10 MG tablet Take 1 tablet (10 mg total) by mouth daily.  100 tablet  3  . oxyCODONE (OXY IR/ROXICODONE) 5 MG immediate release tablet Take 1 tablet (5 mg total) by mouth every 6 (six) hours as needed for severe pain.  30 tablet  0   No current facility-administered medications for this visit.   Facility-Administered Medications Ordered in Other Visits  Medication Dose Route Frequency Provider Last Rate Last Dose  . sodium chloride 0.9 % injection 10 mL  10 mL Intracatheter PRN Meagan Bears, Meagan Garcia   10 mL at 03/21/13 1615    SURGICAL HISTORY:  Past Surgical History  Procedure Laterality Date  . Abdominal hysterectomy      partial  . Tubal reversal    . Tubal ligation      x2  . Fibroid tumors    . Knee arthroscopy    . Foot surgery      cysts from both feet  . Video bronchoscopy Bilateral 02/21/2013    Procedure: VIDEO BRONCHOSCOPY WITHOUT FLUORO;  Surgeon: Meagan Fudge, Meagan Garcia;  Location: [facility];  Service: Cardiopulmonary;  Laterality: Bilateral;    REVIEW OF SYSTEMS:   Constitutional: positive for fatigue Eyes: negative Ears, nose, mouth, throat, and face: positive for epistaxis Respiratory: positive for cough, hemoptysis and wheezing Cardiovascular: negative Gastrointestinal: positive for constipation Genitourinary:negative Integument/breast: negative Hematologic/lymphatic: negative Musculoskeletal:negative Neurological: negative Behavioral/Psych: negative Endocrine: negative Allergic/Immunologic: negative   PHYSICAL EXAMINATION: General appearance: alert, cooperative, appears stated age, mild distress and audilble wheezing Head: Normocephalic, without obvious abnormality, atraumatic Neck: no adenopathy, no carotid bruit, no JVD, supple, symmetrical, trachea midline and thyroid not enlarged, symmetric, no tenderness/mass/nodules Lymph nodes: Cervical, supraclavicular, and axillary nodes normal. Resp: wheezes anterior - right and posterior - Upper lung fields Back: symmetric, no curvature. ROM normal. No CVA tenderness. Cardio: regular rate and rhythm, S1, S2 normal, no murmur, click, rub or gallop GI: soft, non-tender; bowel sounds normal; no masses,  no organomegaly and Areas of ecchymosis in various stages of healing scattered about the abdomen also in various sizes, from the subcutaneous Lovenox injections Extremities: extremities normal, atraumatic, no cyanosis or edema Neurologic: Alert and oriented X 3, normal strength and tone. Normal symmetric reflexes. Normal coordination and gait Examination the nose: There is dried blood in both nares without any active bleeding  ECOG PERFORMANCE STATUS: 1 - Symptomatic but completely ambulatory  Blood pressure 130/53, pulse 105, temperature 98.3 F (36.8 C), temperature source Oral, resp. rate 18, height 5\' 5"  (1.651 m), weight 212 lb 6.4 oz (96.344 kg), SpO2 96.00%.  LABORATORY DATA: Lab Results  Component Value Date   WBC 9.9 03/21/2013   HGB 11.5* 03/21/2013   HCT 36.2 03/21/2013   MCV 87.4  03/21/2013   PLT 153 03/21/2013      Chemistry      Component Value Date/Time   NA 135* 03/21/2013 1145   NA 135* 03/16/2013 0510   K 4.0 03/21/2013 1145   K 4.2 03/16/2013 0510   CL 99 03/16/2013 0510   CO2 22 03/21/2013 1145   CO2 24 03/16/2013 0510   BUN 11.3 03/21/2013 1145   BUN 19 03/16/2013 0510   CREATININE 0.7 03/21/2013 1145   CREATININE 0.77 03/16/2013 0510      Component Value Date/Time   CALCIUM 9.5 03/21/2013 1145   CALCIUM 9.5 03/16/2013 0510   ALKPHOS 99 03/21/2013 1145   ALKPHOS 150* 03/07/2013 1210   AST 24 03/21/2013 1145   AST 45* 03/07/2013 1210   ALT 101* 03/21/2013 1145   ALT 89* 03/07/2013 1210   BILITOT 0.43 03/21/2013 1145   BILITOT 0.8 03/07/2013 1210       RADIOGRAPHIC STUDIES:  Meagan Chest 2 Garcia  03/14/2013   CLINICAL DATA:  History of lung cancer, followup atelectasis  EXAM: CHEST  2 Garcia  COMPARISON:  03/09/2013  FINDINGS: The cardiac shadow is stable. The left lung remains clear. A right-sided PICC line is again identified in satisfactory position. Persistent elevation of the right hemidiaphragm with consolidation of the right lung base is noted. A small pleural effusion is noted as well. The overall appearance is stable from the prior exam.  IMPRESSION: No significant change from the prior study.   Electronically Signed   By: Meagan Garcia M.D.   On: 03/14/2013 08:04   Ct Angio Chest Pe W/cm &/or Wo Cm  03/07/2013   CLINICAL DATA:  Newly diagnosed right-sided lung cancer post initiation of chemotherapy and radiation therapy, now with worsening dyspnea upon exertion - evaluate for pulmonary embolism  EXAM: CT ANGIOGRAPHY CHEST WITH CONTRAST  TECHNIQUE: Multidetector CT imaging of the chest was performed using the standard protocol during bolus administration of intravenous contrast. Multiplanar CT image reconstructions including MIPs were obtained to evaluate the vascular anatomy.  CONTRAST:  119mL OMNIPAQUE IOHEXOL 350 MG/ML SOLN  COMPARISON:  Chest CT -02/18/2013   FINDINGS: Vascular Findings:  There is suboptimal opacification of the pulmonary arterial system with the main pulmonary artery measuring 214 Hounsfield units. There are nonocclusive filling defects within multiple left-sided segmental and subsegmental pulmonary arteries (representative left upper lobe - images 69 and 91; lingula - image 115; left lower lobe - images 119 and 136). There is malignant obstruction of the right upper lobe pulmonary artery (image 111, series 6). Otherwise, there are no discrete filling defects within the right-sided pulmonary arterial tree. The overall clot burden is deemed small in volume. Normal caliber of the main pulmonary artery. There  is no CT evidence of right-sided heart strain. There is no reflux of contrast into the hepatic venous system.  Normal heart size. No pericardial effusion. A normal caliber of the thoracic aorta. Bovine configuration of the aortic arch. The imaged branches of the aortic arch are widely patent throughout their imaged course. No definite thoracic aortic dissection or periaortic stranding.  Review of the MIP images confirms the above findings.   ----------------------------------------------------------------------------------  Nonvascular Findings:  No definitive evidence of pulmonary infarction.  The known right suprahilar mass is grossly unchanged to minimally increased in size in the interval, currently measuring approximately 5.0 x 6.2 cm (as measured Ing greatest oblique axial dimension (image 31, series 7), previously, 4.4 x 6.8 cm and, and currently approximately 8.1 cm in greatest craniocaudal dimension (coronal image 56, series 8), previously, 7.8 cm,  though note, exact measurements are difficult secondary to the irregular shape of this mass. This right super hilar mass now results in complete obstruction of the right mainstem bronchus with near complete atelectasis of the right middle and lower lobes with partial atelectasis of the remaining  right upper lobe. There is worsening interlobular septal thickening within the aerated right upper lobe. Interval development of a small right-sided pleural effusion.  There is worsening mediastinal lymphadenopathy with index prevascular lymph node now measuring approximately 1.7 cm in greatest oblique axial dimension (image 27, series 4, previously, a 0.8 cm. Worsening left infrahilar lymphadenopathy with index nodal conglomeration measuring approximate 1.7 cm (image 37, series 4, previously, 1.1 cm. Nodal adenopathy continues to extend and surrounded the right lower lobe bronchus (image 40, series 4, grossly unchanged.  Worsening interlobular septal thickening within the left lung, most conspicuous within the bilateral lung bases.  Limited early arterial phase evaluation of the upper abdomen is normal.  There is a persistent slightly mottled appearance of the thoracic spine, likely secondary to patient's obesity. No discrete aggressive osseous abnormalities. Normal appearance of the thyroid gland.  IMPRESSION: 1. The examination is positive for pulmonary embolism involving multiple segmental and subsegmental arteries of all lobes of the left lung. Overall, the clot burden is deemed small. No definitive CT evidence of right-sided heart strain or pulmonary infarction. 2. Overall findings suggestive of worsening metastatic bronchogenic carcinoma with suspected minimal increase in right suprahilar mass and associated mediastinal adenopathy, now with near complete obstruction of the right middle and lower lobes. Worsening interlobular septal thickening within the residual aerated right upper lobe is nonspecific though could be indicative of lymphangitic spread of tumor. Continued attention on follow-up is recommended. 3. Interval development of small right-sided pleural effusion. 4. Worsening interlobular septal thickening within the contralateral left lung, nonspecific though could be indicative of pulmonary edema,  though again, lymphangitic spread of tumor is not excluded. Critical Value/emergent results were called by telephone at the time of interpretation on 03/07/2013 at 2:26 PM to Meagan Garcia , who verbally acknowledged these results.   Electronically Signed   By: Meagan Garcia M.D.   On: 03/07/2013 14:32   Meagan Garcia SA Contrast  03/15/2013   CLINICAL DATA:  New diagnosis lung cancer. Evaluate for brain metastases.  EXAM: MRI HEAD WITHOUT AND WITH CONTRAST  TECHNIQUE: Multiplanar, multiecho pulse sequences of the brain and surrounding structures were obtained without and with intravenous contrast.  CONTRAST:  75mL MULTIHANCE GADOBENATE DIMEGLUMINE 529 MG/ML IV SOLN  COMPARISON:  None.  FINDINGS: Bone marrow signal in the visualized upper cervical spine is mildly heterogeneous with diminished T1 signal noted, particularly  in the dens.  There is no evidence of acute infarct. There are a few small foci of T2 hyperintensity within the white matter of the frontal lobes, nonspecific but may reflect minimal chronic small vessel ischemic disease. Ventricles and sulci are within normal limits for age. There is no mass, midline shift, intracranial hemorrhage, or extra-axial fluid collection. There is no abnormal enhancement. Calvarium is normal in signal. Major intracranial vascular flow voids are unremarkable. Orbits are normal. Paranasal sinuses and mastoid air cells are clear.  IMPRESSION: 1. No evidence of intracranial metastases. 2. Mildly heterogeneous and diminished bone marrow signal in the visualized upper cervical spine, nonspecific. This may be degenerative in nature, although metastatic disease cannot be completely excluded.   Electronically Signed   By: Meagan Garcia   On: 03/15/2013 17:55   Meagan Garcia  03/09/2013   CLINICAL DATA:  Atelectasis.  EXAM: PORTABLE CHEST - 1 Garcia  COMPARISON:  03/08/2013.  FINDINGS: Persistent atelectasis of the right lung is noted. Underlying pleural effusion cannot be  excluded. There is shift of the mediastinum and heart from left to right. Left lung is clear. Cardiac silhouette cannot be evaluated. Pulmonary vascularity is normal. No acute osseous abnormality.  IMPRESSION: Persistent atelectasis right lung. Underlying pleural effusion cannot be excluded.   Electronically Signed   By: Meagan Moores  Garcia   On: 03/09/2013 06:49   Meagan Garcia  03/08/2013   CLINICAL DATA:  Atelectasis  EXAM: PORTABLE CHEST - 1 Garcia  COMPARISON:  Prior chest x-ray and CT PE study 03/07/2013  FINDINGS: Interval development of whiteout of the right hemi thorax with left-to-right shift of the cardiac and mediastinal structures. There is abrupt cut off of the right main stem bronchus. The left lung remains well it simply well aerated although there is diffuse interstitial prominence which remains unchanged compared to the recent prior imaging. No acute osseous abnormality. No pneumothorax.  IMPRESSION: Interval obstruction of the right main stem bronchus resulting in complete atelectasis of the right lung. Resultant volume loss with left-to-right shift of the cardiac and mediastinal structures.  Given the rapid interval change, mucous plugging in the narrowed right main stem bronchus is the most likely etiology.  These results were called by telephone at the time of interpretation on 03/08/2013 at 8:15 AM to Meagan Garcia, who verbally acknowledged these results.   Electronically Signed   By: Meagan Garcia M.D.   On: 03/08/2013 08:15   Meagan Garcia  03/07/2013   CLINICAL DATA:  Shortness of breath and difficulty breathing  EXAM: PORTABLE CHEST - 1 Garcia  COMPARISON:  02/18/2013  FINDINGS: There is again noted fullness in the hilum and right suprahilar at/ peritracheal region consistent with the given history of centrally obstructing mass lesion. Increasing consolidation is noted in the right lower lobe. The left lung is clear.  IMPRESSION: Persistent changes consistent with the  given clinical history. There is increasing right basilar consolidation identified likely related to post obstructive changes.   Electronically Signed   By: Meagan Garcia M.D.   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 She is currently undergoing a course of concurrent chemoradiation. She was also diagnosed in December 2014 with pulmonary  embolism and is on treatment with Lovenox at 100 mg subcutaneously given every 12 hours. Due to the epistaxis as well as the subcutaneous ecchymosis on the abdomen (although some of this may be due to technique and (the patient's Lovenox dose will be decreased to 150 mg subcutaneously once daily. She will ultimately need a prescription for 150 mg syringes. Patient will decide whether she wants this to go to her current pharmacy or to the [facility]. She has significant upper airway tightness with significant wheezing and we will give her an albuterol nebulizer treatment in the infusion area today. She has a hospital followup appointment with Dr. Melvyn Novas on 03/23/2013. The patient was discussed with and also seen by Dr. Julien Nordmann. She will continue on her course of concurrent chemoradiation and followup in 2 weeks for another symptom management visit.    Meagan Garcia, Meagan Bolger Garcia, Meagan Garcia     All questions were answered. The patient knows to call the clinic with any problems, questions or concerns. We can certainly see the patient much sooner if necessary.  Addendum:  Hematology/Oncology Attending: I had a face-to-face encounter with the patient. I recommended her care plan. This is a very pleasant 63 years old [place] American female who was recently diagnosed  with a stage IIIB non-small cell lung cancer currently undergoing concurrent chemoradiation with weekly carboplatin and paclitaxel is status post 1 dose of chemotherapy in 2 weeks of radiation. The patient was recently admitted to [facility] with significant dyspnea and was found to have collapse of the right lower lobe as well as pulmonary embolism involving the left lung. She was started on Lovenox 1 mg/kg subcutaneously twice a day. She had several episodes of epistaxis and we had to hold her Lovenox several times. I recommended for the patient to decrease the dose of Lovenox to 1.5 mg/kg once daily.  She will continue her course of concurrent chemoradiation today as scheduled. The patient would come back for followup visit in 2 weeks for reevaluation and management any adverse effect of her chemotherapy. She was advised to call immediately if she has any concerning symptoms in the interval. Meagan Kempf., Meagan Garcia 03/22/2013

## 2013-03-22 ENCOUNTER — Other Ambulatory Visit: Payer: Self-pay

## 2013-03-22 ENCOUNTER — Ambulatory Visit
Admission: RE | Admit: 2013-03-22 | Discharge: 2013-03-22 | Disposition: A | Discharge status: Home / Self Care | Payer: BC Managed Care – PPO | Source: Ambulatory Visit | Attending: Radiation Oncology | Admitting: Radiation Oncology

## 2013-03-22 VITALS — BP 134/73 | HR 100 | Temp 98.3°F | Ht 65.0 in | Wt 211.5 lb

## 2013-03-22 DIAGNOSIS — C3491 Malignant neoplasm of unspecified part of right bronchus or lung: Secondary | ICD-10-CM

## 2013-03-22 MED ORDER — SUCRALFATE 1 GM/10ML PO SUSP: 1.0000 g | Freq: Three times a day (TID) | ORAL | Status: DC

## 2013-03-22 NOTE — Progress Notes (Signed)
[place]     Rexene Edison, M.D. [place], [place] [address]               Blair Promise, M.D., Ph.D. Phone: [phone]      [place] A. Tammi Klippel, M.D. Fax: [phone]      Jodelle Gross, M.D., Ph.D.         Thea Silversmith, M.D.         Wyvonnia Lora, M.D Weekly Treatment Management Note  Name: Meagan Garcia     MRN: [id number]        CSN: [id number] Date: 03/22/2013      DOB: 09-17-1950  CC: Meagan Kehr, MD         [place]    Status: Outpatient  Diagnosis: The encounter diagnosis was Non-small cell cancer of right lung.  Current Dose: 23.4 Gy  Current Fraction: 13  Planned Dose: 63 Gy  Narrative: Meagan Garcia was seen today for weekly treatment management. The chart was checked and CBCT  were reviewed. She is doing reasonably well this week. She was discharged from the hospital on January 7. She does have some esophageal symptoms and I've given her prescription for Carafate suspension.  She also has oxycodone for pain control.  She denies any further hemoptysis.  She uses supplemental oxygen at night and with exertion.  The patient's lung has opened up some and she therefore had to undergo re simulation last week with a new plan  Review of patient's allergies indicates no known allergies. Current Outpatient Prescriptions  Medication Sig Dispense Refill  . albuterol (PROVENTIL HFA;VENTOLIN HFA) 108 (90 BASE) MCG/ACT inhaler Inhale 2 puffs into the lungs every 6 (six) hours as needed for wheezing or shortness of breath.  1 Inhaler  2  . buPROPion (WELLBUTRIN SR) 150 MG 12 hr tablet Take 150 mg by mouth daily.      . cholecalciferol (VITAMIN D) 1000 UNITS tablet Take 1 tablet (1,000 Units total) by mouth daily.  30 tablet  0  . enoxaparin (LOVENOX) 100 MG/ML injection Inject 1 mL (100 mg total) into the skin every 12 (twelve) hours.  20 Syringe  4  . fluticasone (FLONASE) 50 MCG/ACT nasal spray Place 2  sprays into the nose daily.  48 g  3  . hyaluronate sodium (RADIAPLEXRX) GEL Apply 1 application topically 2 (two) times daily. Apply to chest area being treated with radiation after rad tx and bedtime when skin becomes itchy or irritation/erythema  And on weekedns as well      . oxyCODONE (OXY IR/ROXICODONE) 5 MG immediate release tablet Take 1 tablet (5 mg total) by mouth every 6 (six) hours as needed for severe pain.  30 tablet  0  . predniSONE (DELTASONE) 10 MG tablet Take 4 tabs  daily with food x 4 days, then 3 tabs daily x 4 days, then 2 tabs daily x 4 days, then 1 tab daily x4 days then stop. #40  40 tablet  0  . tiotropium (SPIRIVA HANDIHALER) 18 MCG inhalation capsule Place 1 capsule (18 mcg total) into inhaler and inhale daily.  30 capsule  0  . carvedilol (COREG) 6.25 MG tablet Take 1 tablet (6.25 mg total) by mouth 2 (two) times daily with a meal.      . chlorpheniramine-HYDROcodone (TUSSIONEX PENNKINETIC ER) 10-8 MG/5ML LQCR Take 5 mLs by mouth every 12 (twelve) hours as needed.  115 mL  0  .  loratadine (CLARITIN) 10 MG tablet Take 1 tablet (10 mg total) by mouth daily.  100 tablet  3  . LORazepam (ATIVAN) 1 MG tablet TAKE ONE TABLET BY MOUTH TWICE DAILY  180 tablet  0  . prochlorperazine (COMPAZINE) 10 MG tablet Take 1 tablet (10 mg total) by mouth every 6 (six) hours as needed for nausea or vomiting.  60 tablet  0  . sucralfate (CARAFATE) 1 GM/10ML suspension Take 10 mLs (1 g total) by mouth 4 (four) times daily -  with meals and at bedtime.  420 mL  0   No current facility-administered medications for this encounter.   Labs:  Lab Results  Component Value Date   WBC 9.9 03/21/2013   HGB 11.5* 03/21/2013   HCT 36.2 03/21/2013   MCV 87.4 03/21/2013   PLT 153 03/21/2013   Lab Results  Component Value Date   CREATININE 0.7 03/21/2013   BUN 11.3 03/21/2013   NA 135* 03/21/2013   K 4.0 03/21/2013   CL 99 03/16/2013   CO2 22 03/21/2013   Lab Results  Component Value Date   ALT 101*  03/21/2013   AST 24 03/21/2013   PHOS 2.4 03/12/2013   BILITOT 0.43 03/21/2013    Physical Examination:  Filed Vitals:   03/22/13 1132  BP: 134/73  Pulse: 100  Temp: 98.3 F (36.8 C)    Wt Readings from Last 3 Encounters:  03/22/13 211 lb 8 oz (95.936 kg)  03/21/13 212 lb 6.4 oz (96.344 kg)  03/18/13 209 lb 3.2 oz (94.892 kg)    No secondary infection in the oral cavity Lungs - Normal respiratory effort, chest expands symmetrically. Lungs are clear to auscultation, no crackles, some wheezes.  Heart has regular rhythm and rate  Abdomen is soft and non tender with normal bowel sounds  Assessment:  Patient tolerating treatments well  Plan: Continue treatment per original radiation prescription

## 2013-03-22 NOTE — Progress Notes (Addendum)
Meagan Garcia has had 13 fractions to her right lung.  She denies pain.  She continues to cough up blood in the mornings and also nose bleeds.  Her lovenox has been decreased, starting today, to 150 mg daily.  She has occasional sore, dry throat and indigestion in her mid sternal areat and is eating softer foods.  She has shortness of breath with activity.  She does have fatigue.  Her skin is intact in the treatment area.  She had chemotherapy yesterday.

## 2013-03-23 ENCOUNTER — Encounter: Payer: Self-pay | Admitting: Pharmacist

## 2013-03-23 ENCOUNTER — Ambulatory Visit
Admission: RE | Admit: 2013-03-23 | Discharge: 2013-03-23 | Disposition: A | Discharge status: Home / Self Care | Payer: BC Managed Care – PPO | Source: Ambulatory Visit | Attending: Radiation Oncology | Admitting: Radiation Oncology

## 2013-03-23 ENCOUNTER — Encounter: Payer: Self-pay | Admitting: Internal Medicine

## 2013-03-23 ENCOUNTER — Ambulatory Visit (INDEPENDENT_AMBULATORY_CARE_PROVIDER_SITE_OTHER): Payer: BC Managed Care – PPO | Admitting: Internal Medicine

## 2013-03-23 ENCOUNTER — Ambulatory Visit (HOSPITAL_BASED_OUTPATIENT_CLINIC_OR_DEPARTMENT_OTHER): Payer: BC Managed Care – PPO

## 2013-03-23 VITALS — BP 135/67 | HR 95 | Temp 97.8°F

## 2013-03-23 VITALS — BP 108/74 | HR 97 | Temp 98.2°F | Ht 63.0 in | Wt 215.0 lb

## 2013-03-23 DIAGNOSIS — Z452 Encounter for adjustment and management of vascular access device: Secondary | ICD-10-CM

## 2013-03-23 DIAGNOSIS — C3400 Malignant neoplasm of unspecified main bronchus: Secondary | ICD-10-CM

## 2013-03-23 DIAGNOSIS — J45909 Unspecified asthma, uncomplicated: Secondary | ICD-10-CM

## 2013-03-23 DIAGNOSIS — I10 Essential (primary) hypertension: Secondary | ICD-10-CM

## 2013-03-23 MED ORDER — SODIUM CHLORIDE 0.9 % IJ SOLN
10.0000 mL | INTRAMUSCULAR | Status: DC | PRN
  Administered 2013-03-23: 10 mL via INTRAVENOUS
  Filled 2013-03-23: qty 10

## 2013-03-23 MED ORDER — HEPARIN SOD (PORK) LOCK FLUSH 100 UNIT/ML IV SOLN
500.0000 [IU] | Freq: Once | INTRAVENOUS | Status: AC
  Administered 2013-03-23: 250 [IU] via INTRAVENOUS
  Filled 2013-03-23: qty 5

## 2013-03-23 NOTE — Progress Notes (Signed)
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 Started on RT ([facility]) with goal of starting chemo 12/30 ([facility]) presented with worsening sob & hypoxic resp failure, CT scan showing progressive RML/RLL atelectasis & multiple left-sided  segmental and subsegmental PE      03/23/2013 1st [facility] office visit/ Borghild Thaker post disharge f/u  cc doe x 50 yards but sometimes sob at rest and difficulty lying flat, better right side down, assoc with cough most productive of sev tbsp slt bloody mucus esp in am but overall feels at least 25%  better  p started RT Mar 02 2013 still on 3 prednisone daily,  Better sob p  saba x sev hours but only using sev times a day   No obvious day to day or daytime variabilty or assoc  cp or chest tightness, subjective wheeze overt sinus or hb symptoms. No unusual exp hx or h/o childhood pna/ asthma or knowledge of premature birth.   Also denies any obvious fluctuation of symptoms with weather or environmental changes or other aggravating or alleviating factors except as outlined above   Current Medications, Allergies, Complete Past Medical History, Past Surgical History, Family History, and Social History were reviewed in [employer] record.  ROS  The following are not active complaints unless bolded sore throat, dysphagia since started RT, dental problems, itching, sneezing,  nasal congestion or  excess/ purulent secretions, ear ache,   fever, chills, sweats, unintended wt loss, pleuritic or exertional cp, hemoptysis,  orthopnea pnd or leg swelling, presyncope, palpitations, heartburn, abdominal pain, anorexia, nausea, vomiting, diarrhea  or change in bowel or urinary habits, change in stools or urine, dysuria,hematuria,  rash, arthralgias, visual complaints, headache, numbness weakness or ataxia or problems with walking or coordination,  change in mood/affect or memory.             Review of Systems     Objective:   Physical Exam    amb bf nad with upper airway sounding "wheeze"  Wt Readings from Last 3 Encounters:  03/23/13 215 lb (97.523 kg)  03/22/13 211 lb 8 oz (95.936 kg)  03/21/13 212 lb 6.4 oz (96.344 kg)     HEENT: nl dentition, turbinates, and orophanx. Nl external ear canals without cough reflex   NECK :  without JVD/Nodes/TM/ nl carotid upstrokes bilaterally   LUNGS: no acc muscle use, clear to A and P bilaterally without cough on insp or exp maneuvers   CV:  RRR  no s3 or murmur or increase in P2, no edema   ABD:  soft and nontender with nl excursion in the supine position. No bruits or organomegaly, bowel sounds nl  MS:  warm without deformities, calf tenderness, cyanosis or clubbing  SKIN: warm and dry without lesions    NEURO:  alert, approp, no deficits     CXR 03/14/13 The cardiac shadow is stable. The left lung remains clear. A  right-sided PICC line  is again identified in satisfactory position.  Persistent elevation of the right hemidiaphragm with consolidation  of the right lung base is noted. A small pleural effusion is noted  as well. The overall appearance is stable from the prior exam.  Assessment:

## 2013-03-23 NOTE — Assessment & Plan Note (Addendum)
DDX of  difficult airways managment all start with A and  include Adherence, Ace Inhibitors, Acid Reflux, Active Sinus Disease, Alpha 1 Antitripsin deficiency, Anxiety masquerading as Airways dz,  ABPA,  allergy(esp in young), Aspiration (esp in elderly), Adverse effects of DPI,  Active smokers, plus two Bs  = Bronchiectasis and Beta blocker use..and one C= CHF  Adherence is always the initial "prime suspect" and is a multilayered concern that requires a "trust but verify" approach in every patient - starting with knowing how to use medications, especially inhalers, correctly, keeping up with refills and understanding the fundamental difference between maintenance and prns vs those medications only taken for a very short course and then stopped and not refilled.  - The proper method of use, as well as anticipated side effects, of a metered-dose inhaler are discussed and demonstrated to the patient. Improved effectiveness after extensive coaching during this visit to a level of approximately  75%  So start symbicort 160 2bid and see if breathing improves and saba dependency resolves  ? B blocker effect > try off coreg and on bystolic (see hbp)

## 2013-03-23 NOTE — Patient Instructions (Signed)
Stop carvediol and use bystolic 10 mg daily   Start symbicort 160 Take 2 puffs first thing in am and then another 2 puffs about 12 hours later.   Continue spiriva daily   Only use your albuterol (ventolin) as a rescue medication to be used if you can't catch your breath by resting or doing a relaxed purse lip breathing pattern.  - The less you use it, the better it will work when you need it. - Ok to use up to 2 puffs  every 4 hours if you must but call for immediate appointment if use goes up over your usual need - Don't leave home without it !!  (think of it like the spare tire for your car)   Please schedule a follow up office visit in 2  weeks, sooner if needed to see Dr Joya Gaskins

## 2013-03-23 NOTE — Progress Notes (Signed)
Pts son Cato Mulligan came by [facility] today and exchanged Enoxaparin 100 mg syringes (#12 syringes) for Lovenox 150 mg syringes (#12 syringes). His mothers dose of Lovenox was changed from 100 mg BID to 150 mg Daily as of yesterday. Kennith Center, Pharm.D., CPP 03/23/2013@10 :49 AM

## 2013-03-23 NOTE — Patient Instructions (Signed)
PICC Home Guide A peripherally inserted central catheter (PICC) is a long, thin, flexible tube that is inserted into a vein in the upper arm. It is a form of intravenous (IV) access. It is considered to be a "central" line because the tip of the PICC ends in a large vein in your chest. This large vein is called the superior vena cava (SVC). The PICC tip ends in the SVC because there is a lot of blood flow in the SVC. This allows medicines and IV fluids to be quickly distributed throughout the body. The PICC is inserted using a sterile technique by a specially trained nurse or physician. After the PICC is inserted, a chest X-ray exam is done to be sure it is in the correct place.  A PICC may be placed for different reasons, such as:  To give medicines and liquid nutrition that can only be given through a central line. Examples are:  Certain antibiotic treatments.  Chemotherapy.  Total parenteral nutrition (TPN).  To take frequent blood samples.  To give IV fluids and blood products.  If there is difficulty placing a peripheral intravenous (PIV) catheter. If taken care of properly, a PICC can remain in place for several months. A PICC can also allow a person to go home from the hospital early. Medicine and PICC care can be managed at home by a family member or home health care team. WHAT PROBLEMS CAN HAPPEN WHEN I HAVE A PICC? Problems with a PICC can occasionally occur. These may include:  A blood clot (thrombus) forming in or at the tip of the PICC. This can cause the PICC to become clogged. A clot-dissolving medicine called tissue plasminogen activator (tPA) can be given through the PICC to help break up the clot.  Inflammation of the vein (phlebitis) in which the PICC is placed. Signs of inflammation may include redness, pain at the insertion site, red streaks, or being able to feel a "cord" in the vein where the PICC is located.  Infection in the PICC or at the insertion site. Signs of  infection may include fever, chills, redness, swelling, or pus drainage from the PICC insertion site.  PICC movement (malposition). The PICC tip may move from its original position due to excessive physical activity, forceful coughing, sneezing, or vomiting.  A break or cut in the PICC. It is important to not use scissors near the PICC.  Nerve or tendon irritation or injury during PICC insertion. WHAT SHOULD I KEEP IN MIND ABOUT ACTIVITIES WHEN I HAVE A PICC?  You may bend your arm and move it freely. If your PICC is near or at the bend of your elbow, avoid activity with repeated motion at the elbow.  Rest at home for the remainder of the day following PICC line insertion.  Avoid lifting heavy objects as instructed by your health care provider.  Avoid using a crutch with the arm on the same side as your PICC. You may need to use a walker. WHAT SHOULD I KNOW ABOUT MY PICC DRESSING?  Keep your PICC bandage (dressing) clean and dry to prevent infection.  Ask your health care provider when you may shower. Ask your health care provider to teach you how to wrap the PICC when you do take a shower.  Change the PICC dressing as instructed by your health care provider.  Change your PICC dressing if it becomes loose or wet. WHAT SHOULD I KNOW ABOUT PICC CARE?  Check the PICC insertion site daily for   leakage, redness, swelling, or pain.  Do not take a bath, swim, or use hot tubs when you have a PICC. Cover PICC line with clear plastic wrap and tape to keep it dry while showering.  Flush the PICC as directed by your health care provider. Let your health care provider know right away if the PICC is difficult to flush or does not flush. Do not use force to flush the PICC.  Do not use a syringe that is less than 10 mL to flush the PICC.  Never pull or tug on the PICC.  Avoid blood pressure checks on the arm with the PICC.  Keep your PICC identification card with you at all times.  Do not  take the PICC out yourself. Only a trained clinical professional should remove the PICC. SEEK IMMEDIATE MEDICAL CARE IF:  Your PICC is accidently pulled all the way out. If this happens, cover the insertion site with a bandage or gauze dressing. Do not throw the PICC away. Your health care provider will need to inspect it.  Your PICC was tugged or pulled and has partially come out. Do not  push the PICC back in.  There is any type of drainage, redness, or swelling where the PICC enters the skin.  You cannot flush the PICC, it is difficult to flush, or the PICC leaks around the insertion site when it is flushed.  You hear a "flushing" sound when the PICC is flushed.  You have pain, discomfort, or numbness in your arm, shoulder, or jaw on the same side as the PICC.  You feel your heart "racing" or skipping beats.  You notice a hole or tear in the PICC.  You develop chills or a fever. MAKE SURE YOU:   Understand these instructions.  Will watch your condition.  Will get help right away if you are not doing well or get worse. Document Released: 08/31/2002 Document Revised: 12/15/2012 Document Reviewed: 11/01/2012 [employer] Patient Information 2014 [employer].  

## 2013-03-24 ENCOUNTER — Encounter: Payer: Self-pay | Admitting: Internal Medicine

## 2013-03-24 ENCOUNTER — Ambulatory Visit
Admission: RE | Admit: 2013-03-24 | Discharge: 2013-03-24 | Disposition: A | Discharge status: Home / Self Care | Payer: BC Managed Care – PPO | Source: Ambulatory Visit | Attending: Radiation Oncology | Admitting: Radiation Oncology

## 2013-03-25 ENCOUNTER — Ambulatory Visit
Admission: RE | Admit: 2013-03-25 | Discharge: 2013-03-25 | Disposition: A | Discharge status: Home / Self Care | Payer: BC Managed Care – PPO | Source: Ambulatory Visit | Attending: Radiation Oncology | Admitting: Radiation Oncology

## 2013-03-25 ENCOUNTER — Ambulatory Visit (HOSPITAL_BASED_OUTPATIENT_CLINIC_OR_DEPARTMENT_OTHER): Payer: BC Managed Care – PPO

## 2013-03-25 DIAGNOSIS — C3400 Malignant neoplasm of unspecified main bronchus: Secondary | ICD-10-CM

## 2013-03-25 DIAGNOSIS — Z452 Encounter for adjustment and management of vascular access device: Secondary | ICD-10-CM

## 2013-03-25 MED ORDER — HEPARIN SOD (PORK) LOCK FLUSH 100 UNIT/ML IV SOLN
500.0000 [IU] | Freq: Once | INTRAVENOUS | Status: AC
  Administered 2013-03-25: 250 [IU] via INTRAVENOUS
  Filled 2013-03-25: qty 5

## 2013-03-25 MED ORDER — SODIUM CHLORIDE 0.9 % IJ SOLN
10.0000 mL | INTRAMUSCULAR | Status: DC | PRN
  Administered 2013-03-25: 10 mL via INTRAVENOUS
  Filled 2013-03-25: qty 10

## 2013-03-25 NOTE — Patient Instructions (Signed)
PICC Home Guide A peripherally inserted central catheter (PICC) is a long, thin, flexible tube that is inserted into a vein in the upper arm. It is a form of intravenous (IV) access. It is considered to be a "central" line because the tip of the PICC ends in a large vein in your chest. This large vein is called the superior vena cava (SVC). The PICC tip ends in the SVC because there is a lot of blood flow in the SVC. This allows medicines and IV fluids to be quickly distributed throughout the body. The PICC is inserted using a sterile technique by a specially trained nurse or physician. After the PICC is inserted, a chest X-ray exam is done to be sure it is in the correct place.  A PICC may be placed for different reasons, such as:  To give medicines and liquid nutrition that can only be given through a central line. Examples are:  Certain antibiotic treatments.  Chemotherapy.  Total parenteral nutrition (TPN).  To take frequent blood samples.  To give IV fluids and blood products.  If there is difficulty placing a peripheral intravenous (PIV) catheter. If taken care of properly, a PICC can remain in place for several months. A PICC can also allow a person to go home from the hospital early. Medicine and PICC care can be managed at home by a family member or home health care team. WHAT PROBLEMS CAN HAPPEN WHEN I HAVE A PICC? Problems with a PICC can occasionally occur. These may include:  A blood clot (thrombus) forming in or at the tip of the PICC. This can cause the PICC to become clogged. A clot-dissolving medicine called tissue plasminogen activator (tPA) can be given through the PICC to help break up the clot.  Inflammation of the vein (phlebitis) in which the PICC is placed. Signs of inflammation may include redness, pain at the insertion site, red streaks, or being able to feel a "cord" in the vein where the PICC is located.  Infection in the PICC or at the insertion site. Signs of  infection may include fever, chills, redness, swelling, or pus drainage from the PICC insertion site.  PICC movement (malposition). The PICC tip may move from its original position due to excessive physical activity, forceful coughing, sneezing, or vomiting.  A break or cut in the PICC. It is important to not use scissors near the PICC.  Nerve or tendon irritation or injury during PICC insertion. WHAT SHOULD I KEEP IN MIND ABOUT ACTIVITIES WHEN I HAVE A PICC?  You may bend your arm and move it freely. If your PICC is near or at the bend of your elbow, avoid activity with repeated motion at the elbow.  Rest at home for the remainder of the day following PICC line insertion.  Avoid lifting heavy objects as instructed by your health care provider.  Avoid using a crutch with the arm on the same side as your PICC. You may need to use a walker. WHAT SHOULD I KNOW ABOUT MY PICC DRESSING?  Keep your PICC bandage (dressing) clean and dry to prevent infection.  Ask your health care provider when you may shower. Ask your health care provider to teach you how to wrap the PICC when you do take a shower.  Change the PICC dressing as instructed by your health care provider.  Change your PICC dressing if it becomes loose or wet. WHAT SHOULD I KNOW ABOUT PICC CARE?  Check the PICC insertion site daily for   leakage, redness, swelling, or pain.  Do not take a bath, swim, or use hot tubs when you have a PICC. Cover PICC line with clear plastic wrap and tape to keep it dry while showering.  Flush the PICC as directed by your health care provider. Let your health care provider know right away if the PICC is difficult to flush or does not flush. Do not use force to flush the PICC.  Do not use a syringe that is less than 10 mL to flush the PICC.  Never pull or tug on the PICC.  Avoid blood pressure checks on the arm with the PICC.  Keep your PICC identification card with you at all times.  Do not  take the PICC out yourself. Only a trained clinical professional should remove the PICC. SEEK IMMEDIATE MEDICAL CARE IF:  Your PICC is accidently pulled all the way out. If this happens, cover the insertion site with a bandage or gauze dressing. Do not throw the PICC away. Your health care provider will need to inspect it.  Your PICC was tugged or pulled and has partially come out. Do not  push the PICC back in.  There is any type of drainage, redness, or swelling where the PICC enters the skin.  You cannot flush the PICC, it is difficult to flush, or the PICC leaks around the insertion site when it is flushed.  You hear a "flushing" sound when the PICC is flushed.  You have pain, discomfort, or numbness in your arm, shoulder, or jaw on the same side as the PICC.  You feel your heart "racing" or skipping beats.  You notice a hole or tear in the PICC.  You develop chills or a fever. MAKE SURE YOU:   Understand these instructions.  Will watch your condition.  Will get help right away if you are not doing well or get worse. Document Released: 08/31/2002 Document Revised: 12/15/2012 Document Reviewed: 11/01/2012 [employer] Patient Information 2014 [employer].  

## 2013-03-25 NOTE — Assessment & Plan Note (Signed)
Strongly prefer in this setting: Bystolic, the most beta -1  selective Beta blocker available in sample form, with bisoprolol the most selective generic choice  on the market.   Start bystolic 10 mg daily and regroup in 2 weeks

## 2013-03-28 ENCOUNTER — Ambulatory Visit
Admission: RE | Admit: 2013-03-28 | Discharge: 2013-03-28 | Disposition: A | Discharge status: Home / Self Care | Payer: BC Managed Care – PPO | Source: Ambulatory Visit | Attending: Radiation Oncology | Admitting: Radiation Oncology

## 2013-03-28 ENCOUNTER — Ambulatory Visit (HOSPITAL_BASED_OUTPATIENT_CLINIC_OR_DEPARTMENT_OTHER): Payer: BC Managed Care – PPO

## 2013-03-28 ENCOUNTER — Other Ambulatory Visit (HOSPITAL_BASED_OUTPATIENT_CLINIC_OR_DEPARTMENT_OTHER): Payer: BC Managed Care – PPO

## 2013-03-28 VITALS — BP 133/77 | HR 103 | Temp 97.4°F | Resp 20

## 2013-03-28 DIAGNOSIS — C3491 Malignant neoplasm of unspecified part of right bronchus or lung: Secondary | ICD-10-CM

## 2013-03-28 DIAGNOSIS — C3400 Malignant neoplasm of unspecified main bronchus: Secondary | ICD-10-CM

## 2013-03-28 DIAGNOSIS — Z5111 Encounter for antineoplastic chemotherapy: Secondary | ICD-10-CM

## 2013-03-28 LAB — CBC WITH DIFFERENTIAL/PLATELET
BASO%: 0.5 % (ref 0.0–2.0)
BASOS ABS: 0 10*3/uL (ref 0.0–0.1)
EOS ABS: 0 10*3/uL (ref 0.0–0.5)
EOS%: 0 % (ref 0.0–7.0)
HEMATOCRIT: 35 % (ref 34.8–46.6)
HEMOGLOBIN: 10.9 g/dL — AB (ref 11.6–15.9)
LYMPH%: 8.4 % — ABNORMAL LOW (ref 14.0–49.7)
MCH: 27.6 pg (ref 25.1–34.0)
MCHC: 31.1 g/dL — ABNORMAL LOW (ref 31.5–36.0)
MCV: 88.6 fL (ref 79.5–101.0)
MONO#: 0.5 10*3/uL (ref 0.1–0.9)
MONO%: 11.4 % (ref 0.0–14.0)
NEUT#: 3.5 10*3/uL (ref 1.5–6.5)
NEUT%: 79.7 % — ABNORMAL HIGH (ref 38.4–76.8)
Platelets: 165 10*3/uL (ref 145–400)
RBC: 3.95 10*6/uL (ref 3.70–5.45)
RDW: 16.4 % — ABNORMAL HIGH (ref 11.2–14.5)
WBC: 4.4 10*3/uL (ref 3.9–10.3)
lymph#: 0.4 10*3/uL — ABNORMAL LOW (ref 0.9–3.3)
nRBC: 0 % (ref 0–0)

## 2013-03-28 LAB — COMPREHENSIVE METABOLIC PANEL (CC13)
ALK PHOS: 81 U/L (ref 40–150)
ALT: 59 U/L — ABNORMAL HIGH (ref 0–55)
AST: 19 U/L (ref 5–34)
Albumin: 2.9 g/dL — ABNORMAL LOW (ref 3.5–5.0)
Anion Gap: 10 mEq/L (ref 3–11)
BUN: 11.2 mg/dL (ref 7.0–26.0)
CO2: 23 meq/L (ref 22–29)
Calcium: 9.5 mg/dL (ref 8.4–10.4)
Chloride: 105 mEq/L (ref 98–109)
Creatinine: 0.7 mg/dL (ref 0.6–1.1)
GLUCOSE: 100 mg/dL (ref 70–140)
POTASSIUM: 4 meq/L (ref 3.5–5.1)
Sodium: 138 mEq/L (ref 136–145)
Total Bilirubin: 0.33 mg/dL (ref 0.20–1.20)
Total Protein: 6 g/dL — ABNORMAL LOW (ref 6.4–8.3)

## 2013-03-28 LAB — TECHNOLOGIST REVIEW

## 2013-03-28 MED ORDER — SODIUM CHLORIDE 0.9 % IV SOLN
276.0000 mg | Freq: Once | INTRAVENOUS | Status: AC
  Administered 2013-03-28: 280 mg via INTRAVENOUS
  Filled 2013-03-28: qty 28

## 2013-03-28 MED ORDER — DEXAMETHASONE SODIUM PHOSPHATE 20 MG/5ML IJ SOLN
INTRAMUSCULAR | Status: AC
  Filled 2013-03-28: qty 5

## 2013-03-28 MED ORDER — DIPHENHYDRAMINE HCL 50 MG/ML IJ SOLN
INTRAMUSCULAR | Status: AC
  Filled 2013-03-28: qty 1

## 2013-03-28 MED ORDER — HEPARIN SOD (PORK) LOCK FLUSH 100 UNIT/ML IV SOLN
250.0000 [IU] | Freq: Once | INTRAVENOUS | Status: AC | PRN
  Administered 2013-03-28: 250 [IU]
  Filled 2013-03-28: qty 5

## 2013-03-28 MED ORDER — FAMOTIDINE IN NACL 20-0.9 MG/50ML-% IV SOLN
INTRAVENOUS | Status: AC
  Filled 2013-03-28: qty 50

## 2013-03-28 MED ORDER — PACLITAXEL CHEMO INJECTION 300 MG/50ML
45.0000 mg/m2 | Freq: Once | INTRAVENOUS | Status: AC
  Administered 2013-03-28: 96 mg via INTRAVENOUS
  Filled 2013-03-28: qty 16

## 2013-03-28 MED ORDER — DEXAMETHASONE SODIUM PHOSPHATE 20 MG/5ML IJ SOLN
20.0000 mg | Freq: Once | INTRAMUSCULAR | Status: AC
  Administered 2013-03-28: 20 mg via INTRAVENOUS

## 2013-03-28 MED ORDER — ONDANSETRON 16 MG/50ML IVPB (CHCC)
INTRAVENOUS | Status: AC
  Filled 2013-03-28: qty 16

## 2013-03-28 MED ORDER — SODIUM CHLORIDE 0.9 % IJ SOLN
10.0000 mL | INTRAMUSCULAR | Status: DC | PRN
  Administered 2013-03-28: 10 mL
  Filled 2013-03-28: qty 10

## 2013-03-28 MED ORDER — DIPHENHYDRAMINE HCL 50 MG/ML IJ SOLN
50.0000 mg | Freq: Once | INTRAMUSCULAR | Status: AC
  Administered 2013-03-28: 50 mg via INTRAVENOUS

## 2013-03-28 MED ORDER — HEPARIN SOD (PORK) LOCK FLUSH 100 UNIT/ML IV SOLN
500.0000 [IU] | Freq: Once | INTRAVENOUS | Status: AC | PRN
  Filled 2013-03-28: qty 5

## 2013-03-28 MED ORDER — SODIUM CHLORIDE 0.9 % IV SOLN
Freq: Once | INTRAVENOUS | Status: AC
  Administered 2013-03-28: 11:00:00 via INTRAVENOUS

## 2013-03-28 MED ORDER — ONDANSETRON 16 MG/50ML IVPB (CHCC)
16.0000 mg | Freq: Once | INTRAVENOUS | Status: AC
  Administered 2013-03-28: 16 mg via INTRAVENOUS

## 2013-03-28 MED ORDER — FAMOTIDINE IN NACL 20-0.9 MG/50ML-% IV SOLN
20.0000 mg | Freq: Once | INTRAVENOUS | Status: AC
  Administered 2013-03-28: 20 mg via INTRAVENOUS

## 2013-03-28 NOTE — Patient Instructions (Signed)
[facility] Discharge Instructions for Patients Receiving Chemotherapy  Today you received the following chemotherapy agents Taxol/Carboplatin To help prevent nausea and vomiting after your treatment, we encourage you to take your nausea medication as prescribed.  If you develop nausea and vomiting that is not controlled by your nausea medication, call the clinic.   BELOW ARE SYMPTOMS THAT SHOULD BE REPORTED IMMEDIATELY:  *FEVER GREATER THAN 100.5 F  *CHILLS WITH OR WITHOUT FEVER  NAUSEA AND VOMITING THAT IS NOT CONTROLLED WITH YOUR NAUSEA MEDICATION  *UNUSUAL SHORTNESS OF BREATH  *UNUSUAL BRUISING OR BLEEDING  TENDERNESS IN MOUTH AND THROAT WITH OR WITHOUT PRESENCE OF ULCERS  *URINARY PROBLEMS  *BOWEL PROBLEMS  UNUSUAL RASH Items with * indicate a potential emergency and should be followed up as soon as possible.  Feel free to call the clinic you have any questions or concerns. The clinic phone number is [phone].    

## 2013-03-29 ENCOUNTER — Ambulatory Visit
Admission: RE | Admit: 2013-03-29 | Discharge: 2013-03-29 | Disposition: A | Discharge status: Home / Self Care | Payer: BC Managed Care – PPO | Source: Ambulatory Visit | Attending: Radiation Oncology | Admitting: Radiation Oncology

## 2013-03-29 ENCOUNTER — Ambulatory Visit: Payer: BC Managed Care – PPO | Attending: Internal Medicine | Admitting: Physical Therapy

## 2013-03-29 VITALS — BP 112/61 | HR 89 | Temp 98.1°F | Ht 63.0 in | Wt 210.4 lb

## 2013-03-29 DIAGNOSIS — C3490 Malignant neoplasm of unspecified part of unspecified bronchus or lung: Secondary | ICD-10-CM | POA: Insufficient documentation

## 2013-03-29 DIAGNOSIS — C3491 Malignant neoplasm of unspecified part of right bronchus or lung: Secondary | ICD-10-CM

## 2013-03-29 DIAGNOSIS — R5381 Other malaise: Secondary | ICD-10-CM | POA: Insufficient documentation

## 2013-03-29 DIAGNOSIS — IMO0001 Reserved for inherently not codable concepts without codable children: Secondary | ICD-10-CM | POA: Insufficient documentation

## 2013-03-29 DIAGNOSIS — R29898 Other symptoms and signs involving the musculoskeletal system: Secondary | ICD-10-CM | POA: Insufficient documentation

## 2013-03-29 NOTE — Progress Notes (Signed)
Meagan Garcia has had 18 fractions to her right lung.  She denies pain today.  She reports that she has not coughed up any blood this week.  She reports that she has a dry cough and coughs up dark mucus occasionally.  She reports that her shortness of breath has improved and it is easier for her to walk.  Her oxygen sat today was 100% on room air.  She does have a slight sore throat.  She reports that the carafate made her sick to her stomach so she is not taking it.  She reports a good appetite.  She has lsot 5 lbs since last week but says she is wearing less clothing today.   She is not using radiaplex gel yet.  The skin on her right chest and upper back is intact.

## 2013-03-29 NOTE — Progress Notes (Signed)
[place]     Rexene Edison, M.D. [place], [place] [address]               Blair Promise, M.D., Ph.D. Phone: [phone]      [place] A. Tammi Klippel, M.D. Fax: [phone]      Jodelle Gross, M.D., Ph.D.         Thea Silversmith, M.D.         Wyvonnia Lora, M.D Weekly Treatment Management Note  Name: Meagan Garcia     MRN: [id number]        CSN: [id number] Date: 03/29/2013      DOB: [birth year]  CC: [name], MD         [place]    Status: Outpatient  Diagnosis: The encounter diagnosis was Non-small cell cancer of right lung.  Current Dose: 32.4 Gy  Current Fraction: 18  Planned Dose: 63 Gy  Narrative: Meagan Garcia was seen today for weekly treatment management. The chart was checked and CBCT  were reviewed. She continues to show improvement. Her breathing is much better. She denies any swallowing difficulties. She has some mild fatigue. She denies any further hemoptysis.  She is accompanied by her husband and brother today. She could not tolerate Carafate as it made her nauseated.  Review of patient's allergies indicates no known allergies.  Current Outpatient Prescriptions  Medication Sig Dispense Refill  . albuterol (PROVENTIL HFA;VENTOLIN HFA) 108 (90 BASE) MCG/ACT inhaler Inhale 2 puffs into the lungs every 6 (six) hours as needed for wheezing or shortness of breath.  1 Inhaler  2  . budesonide-formoterol (SYMBICORT) 160-4.5 MCG/ACT inhaler Inhale 2 puffs into the lungs 2 (two) times daily.      Marland Kitchen buPROPion (WELLBUTRIN SR) 150 MG 12 hr tablet Take 150 mg by mouth daily.      . cholecalciferol (VITAMIN D) 1000 UNITS tablet Take 1 tablet (1,000 Units total) by mouth daily.  30 tablet  0  . enoxaparin (LOVENOX) 150 MG/ML injection Inject 150 mg into the skin daily.      . fluticasone (FLONASE) 50 MCG/ACT nasal spray Place 2 sprays into the nose daily.  48 g  3  . LORazepam (ATIVAN) 1 MG tablet TAKE ONE  TABLET BY MOUTH ONCE DAILY      . predniSONE (DELTASONE) 10 MG tablet Take 4 tabs  daily with food x 4 days, then 3 tabs daily x 4 days, then 2 tabs daily x 4 days, then 1 tab daily x4 days then stop. #40  40 tablet  0  . tiotropium (SPIRIVA HANDIHALER) 18 MCG inhalation capsule Place 1 capsule (18 mcg total) into inhaler and inhale daily.  30 capsule  0  . carvedilol (COREG) 6.25 MG tablet Take 1 tablet (6.25 mg total) by mouth 2 (two) times daily with a meal.      . hyaluronate sodium (RADIAPLEXRX) GEL Apply 1 application topically 2 (two) times daily. Apply to chest area being treated with radiation after rad tx and bedtime when skin becomes itchy or irritation/erythema  And on weekedns as well      . oxyCODONE (OXY IR/ROXICODONE) 5 MG immediate release tablet Take 1 tablet (5 mg total) by mouth every 6 (six) hours as needed for severe pain.  30 tablet  0  . prochlorperazine (COMPAZINE) 10 MG tablet Take 1 tablet (10 mg total) by mouth every 6 (six) hours as needed for nausea or  vomiting.  60 tablet  0  . sucralfate (CARAFATE) 1 GM/10ML suspension Take 10 mLs (1 g total) by mouth 4 (four) times daily -  with meals and at bedtime.  420 mL  0   No current facility-administered medications for this encounter.   Labs:  Lab Results  Component Value Date   WBC 4.4 03/28/2013   HGB 10.9* 03/28/2013   HCT 35.0 03/28/2013   MCV 88.6 03/28/2013   PLT 165 03/28/2013   Lab Results  Component Value Date   CREATININE 0.7 03/28/2013   BUN 11.2 03/28/2013   NA 138 03/28/2013   K 4.0 03/28/2013   CL 99 03/16/2013   CO2 23 03/28/2013   Lab Results  Component Value Date   ALT 59* 03/28/2013   AST 19 03/28/2013   PHOS 2.4 03/12/2013   BILITOT 0.33 03/28/2013    Physical Examination:  Filed Vitals:   03/29/13 1214  BP: 112/61  Pulse: 89  Temp: 98.1 F (36.7 C)    Wt Readings from Last 3 Encounters:  03/29/13 210 lb 6.4 oz (95.437 kg)  03/23/13 215 lb (97.523 kg)  03/22/13 211 lb 8 oz (95.936 kg)      Lungs - Normal respiratory effort, chest expands symmetrically. Lungs are clear to auscultation, no crackles or wheezes.  Heart has regular rhythm and rate  Abdomen is soft and non tender with normal bowel sounds  Assessment:  Patient tolerating treatments well  Plan: Continue treatment per original radiation prescription

## 2013-03-30 ENCOUNTER — Ambulatory Visit
Admission: RE | Admit: 2013-03-30 | Discharge: 2013-03-30 | Disposition: A | Discharge status: Home / Self Care | Payer: BC Managed Care – PPO | Source: Ambulatory Visit | Attending: Radiation Oncology | Admitting: Radiation Oncology

## 2013-03-30 ENCOUNTER — Ambulatory Visit (HOSPITAL_BASED_OUTPATIENT_CLINIC_OR_DEPARTMENT_OTHER): Payer: BC Managed Care – PPO

## 2013-03-30 VITALS — BP 116/55 | HR 90 | Temp 98.0°F

## 2013-03-30 DIAGNOSIS — C3400 Malignant neoplasm of unspecified main bronchus: Secondary | ICD-10-CM

## 2013-03-30 DIAGNOSIS — Z95828 Presence of other vascular implants and grafts: Secondary | ICD-10-CM

## 2013-03-30 DIAGNOSIS — Z452 Encounter for adjustment and management of vascular access device: Secondary | ICD-10-CM

## 2013-03-30 MED ORDER — HEPARIN SOD (PORK) LOCK FLUSH 100 UNIT/ML IV SOLN
500.0000 [IU] | Freq: Once | INTRAVENOUS | Status: AC
  Administered 2013-03-30: 250 [IU] via INTRAVENOUS
  Filled 2013-03-30: qty 5

## 2013-03-30 MED ORDER — SODIUM CHLORIDE 0.9 % IJ SOLN
10.0000 mL | INTRAMUSCULAR | Status: DC | PRN
  Administered 2013-03-30: 10 mL via INTRAVENOUS
  Filled 2013-03-30: qty 10

## 2013-03-30 NOTE — Patient Instructions (Signed)
PICC Home Guide A peripherally inserted central catheter (PICC) is a long, thin, flexible tube that is inserted into a vein in the upper arm. It is a form of intravenous (IV) access. It is considered to be a "central" line because the tip of the PICC ends in a large vein in your chest. This large vein is called the superior vena cava (SVC). The PICC tip ends in the SVC because there is a lot of blood flow in the SVC. This allows medicines and IV fluids to be quickly distributed throughout the body. The PICC is inserted using a sterile technique by a specially trained nurse or physician. After the PICC is inserted, a chest X-ray exam is done to be sure it is in the correct place.  A PICC may be placed for different reasons, such as:  To give medicines and liquid nutrition that can only be given through a central line. Examples are:  Certain antibiotic treatments.  Chemotherapy.  Total parenteral nutrition (TPN).  To take frequent blood samples.  To give IV fluids and blood products.  If there is difficulty placing a peripheral intravenous (PIV) catheter. If taken care of properly, a PICC can remain in place for several months. A PICC can also allow a person to go home from the hospital early. Medicine and PICC care can be managed at home by a family member or home health care team. WHAT PROBLEMS CAN HAPPEN WHEN I HAVE A PICC? Problems with a PICC can occasionally occur. These may include:  A blood clot (thrombus) forming in or at the tip of the PICC. This can cause the PICC to become clogged. A clot-dissolving medicine called tissue plasminogen activator (tPA) can be given through the PICC to help break up the clot.  Inflammation of the vein (phlebitis) in which the PICC is placed. Signs of inflammation may include redness, pain at the insertion site, red streaks, or being able to feel a "cord" in the vein where the PICC is located.  Infection in the PICC or at the insertion site. Signs of  infection may include fever, chills, redness, swelling, or pus drainage from the PICC insertion site.  PICC movement (malposition). The PICC tip may move from its original position due to excessive physical activity, forceful coughing, sneezing, or vomiting.  A break or cut in the PICC. It is important to not use scissors near the PICC.  Nerve or tendon irritation or injury during PICC insertion. WHAT SHOULD I KEEP IN MIND ABOUT ACTIVITIES WHEN I HAVE A PICC?  You may bend your arm and move it freely. If your PICC is near or at the bend of your elbow, avoid activity with repeated motion at the elbow.  Rest at home for the remainder of the day following PICC line insertion.  Avoid lifting heavy objects as instructed by your health care provider.  Avoid using a crutch with the arm on the same side as your PICC. You may need to use a walker. WHAT SHOULD I KNOW ABOUT MY PICC DRESSING?  Keep your PICC bandage (dressing) clean and dry to prevent infection.  Ask your health care provider when you may shower. Ask your health care provider to teach you how to wrap the PICC when you do take a shower.  Change the PICC dressing as instructed by your health care provider.  Change your PICC dressing if it becomes loose or wet. WHAT SHOULD I KNOW ABOUT PICC CARE?  Check the PICC insertion site daily for   leakage, redness, swelling, or pain.  Do not take a bath, swim, or use hot tubs when you have a PICC. Cover PICC line with clear plastic wrap and tape to keep it dry while showering.  Flush the PICC as directed by your health care provider. Let your health care provider know right away if the PICC is difficult to flush or does not flush. Do not use force to flush the PICC.  Do not use a syringe that is less than 10 mL to flush the PICC.  Never pull or tug on the PICC.  Avoid blood pressure checks on the arm with the PICC.  Keep your PICC identification card with you at all times.  Do not  take the PICC out yourself. Only a trained clinical professional should remove the PICC. SEEK IMMEDIATE MEDICAL CARE IF:  Your PICC is accidently pulled all the way out. If this happens, cover the insertion site with a bandage or gauze dressing. Do not throw the PICC away. Your health care provider will need to inspect it.  Your PICC was tugged or pulled and has partially come out. Do not  push the PICC back in.  There is any type of drainage, redness, or swelling where the PICC enters the skin.  You cannot flush the PICC, it is difficult to flush, or the PICC leaks around the insertion site when it is flushed.  You hear a "flushing" sound when the PICC is flushed.  You have pain, discomfort, or numbness in your arm, shoulder, or jaw on the same side as the PICC.  You feel your heart "racing" or skipping beats.  You notice a hole or tear in the PICC.  You develop chills or a fever. MAKE SURE YOU:   Understand these instructions.  Will watch your condition.  Will get help right away if you are not doing well or get worse. Document Released: 08/31/2002 Document Revised: 12/15/2012 Document Reviewed: 11/01/2012 [employer] Patient Information 2014 [employer].  

## 2013-03-31 ENCOUNTER — Ambulatory Visit
Admission: RE | Admit: 2013-03-31 | Discharge: 2013-03-31 | Disposition: A | Discharge status: Home / Self Care | Payer: BC Managed Care – PPO | Source: Ambulatory Visit | Attending: Radiation Oncology | Admitting: Radiation Oncology

## 2013-04-01 ENCOUNTER — Ambulatory Visit
Admission: RE | Admit: 2013-04-01 | Discharge: 2013-04-01 | Disposition: A | Discharge status: Home / Self Care | Payer: BC Managed Care – PPO | Source: Ambulatory Visit | Attending: Radiation Oncology | Admitting: Radiation Oncology

## 2013-04-01 ENCOUNTER — Ambulatory Visit (HOSPITAL_BASED_OUTPATIENT_CLINIC_OR_DEPARTMENT_OTHER): Payer: BC Managed Care – PPO

## 2013-04-01 VITALS — BP 107/63 | HR 89 | Temp 98.6°F

## 2013-04-01 DIAGNOSIS — C3400 Malignant neoplasm of unspecified main bronchus: Secondary | ICD-10-CM

## 2013-04-01 DIAGNOSIS — Z452 Encounter for adjustment and management of vascular access device: Secondary | ICD-10-CM

## 2013-04-01 MED ORDER — SODIUM CHLORIDE 0.9 % IJ SOLN
10.0000 mL | INTRAMUSCULAR | Status: DC | PRN
  Administered 2013-04-01: 10 mL via INTRAVENOUS
  Filled 2013-04-01: qty 10

## 2013-04-01 MED ORDER — HEPARIN SOD (PORK) LOCK FLUSH 100 UNIT/ML IV SOLN
500.0000 [IU] | Freq: Once | INTRAVENOUS | Status: AC
  Administered 2013-04-01: 250 [IU] via INTRAVENOUS
  Filled 2013-04-01: qty 5

## 2013-04-04 ENCOUNTER — Telehealth: Payer: Self-pay | Admitting: *Deleted

## 2013-04-04 ENCOUNTER — Other Ambulatory Visit: Payer: BC Managed Care – PPO

## 2013-04-04 ENCOUNTER — Other Ambulatory Visit (HOSPITAL_BASED_OUTPATIENT_CLINIC_OR_DEPARTMENT_OTHER): Payer: BC Managed Care – PPO

## 2013-04-04 ENCOUNTER — Encounter: Payer: Self-pay | Admitting: Internal Medicine

## 2013-04-04 ENCOUNTER — Telehealth: Payer: Self-pay | Admitting: Internal Medicine

## 2013-04-04 ENCOUNTER — Ambulatory Visit (HOSPITAL_BASED_OUTPATIENT_CLINIC_OR_DEPARTMENT_OTHER): Payer: BC Managed Care – PPO | Admitting: Internal Medicine

## 2013-04-04 ENCOUNTER — Ambulatory Visit (HOSPITAL_BASED_OUTPATIENT_CLINIC_OR_DEPARTMENT_OTHER): Payer: BC Managed Care – PPO

## 2013-04-04 ENCOUNTER — Ambulatory Visit
Admission: RE | Admit: 2013-04-04 | Discharge: 2013-04-04 | Disposition: A | Discharge status: Home / Self Care | Payer: BC Managed Care – PPO | Source: Ambulatory Visit | Attending: Radiation Oncology | Admitting: Radiation Oncology

## 2013-04-04 VITALS — BP 132/66 | HR 102 | Temp 98.1°F | Resp 18 | Ht 63.0 in | Wt 206.1 lb

## 2013-04-04 DIAGNOSIS — C3400 Malignant neoplasm of unspecified main bronchus: Secondary | ICD-10-CM

## 2013-04-04 DIAGNOSIS — Z5111 Encounter for antineoplastic chemotherapy: Secondary | ICD-10-CM

## 2013-04-04 DIAGNOSIS — C3491 Malignant neoplasm of unspecified part of right bronchus or lung: Secondary | ICD-10-CM

## 2013-04-04 LAB — COMPREHENSIVE METABOLIC PANEL (CC13)
ALBUMIN: 3 g/dL — AB (ref 3.5–5.0)
ALK PHOS: 72 U/L (ref 40–150)
ALT: 53 U/L (ref 0–55)
AST: 24 U/L (ref 5–34)
Anion Gap: 10 mEq/L (ref 3–11)
BUN: 6 mg/dL — ABNORMAL LOW (ref 7.0–26.0)
CALCIUM: 8.7 mg/dL (ref 8.4–10.4)
CHLORIDE: 103 meq/L (ref 98–109)
CO2: 24 mEq/L (ref 22–29)
Creatinine: 0.8 mg/dL (ref 0.6–1.1)
Glucose: 90 mg/dl (ref 70–140)
Potassium: 4 mEq/L (ref 3.5–5.1)
Sodium: 137 mEq/L (ref 136–145)
Total Bilirubin: 0.5 mg/dL (ref 0.20–1.20)
Total Protein: 6.4 g/dL (ref 6.4–8.3)

## 2013-04-04 LAB — CBC WITH DIFFERENTIAL/PLATELET
BASO%: 0.6 % (ref 0.0–2.0)
Basophils Absolute: 0 10*3/uL (ref 0.0–0.1)
EOS%: 0 % (ref 0.0–7.0)
Eosinophils Absolute: 0 10*3/uL (ref 0.0–0.5)
HEMATOCRIT: 34.2 % — AB (ref 34.8–46.6)
HGB: 10.7 g/dL — ABNORMAL LOW (ref 11.6–15.9)
LYMPH%: 12.6 % — ABNORMAL LOW (ref 14.0–49.7)
MCH: 27.8 pg (ref 25.1–34.0)
MCHC: 31.3 g/dL — ABNORMAL LOW (ref 31.5–36.0)
MCV: 88.8 fL (ref 79.5–101.0)
MONO#: 0.6 10*3/uL (ref 0.1–0.9)
MONO%: 16.5 % — ABNORMAL HIGH (ref 0.0–14.0)
NEUT#: 2.4 10*3/uL (ref 1.5–6.5)
NEUT%: 70.3 % (ref 38.4–76.8)
Platelets: 223 10*3/uL (ref 145–400)
RBC: 3.85 10*6/uL (ref 3.70–5.45)
RDW: 17.5 % — ABNORMAL HIGH (ref 11.2–14.5)
WBC: 3.3 10*3/uL — AB (ref 3.9–10.3)
lymph#: 0.4 10*3/uL — ABNORMAL LOW (ref 0.9–3.3)
nRBC: 0 % (ref 0–0)

## 2013-04-04 LAB — TECHNOLOGIST REVIEW

## 2013-04-04 MED ORDER — FAMOTIDINE IN NACL 20-0.9 MG/50ML-% IV SOLN
20.0000 mg | Freq: Once | INTRAVENOUS | Status: AC
  Administered 2013-04-04: 20 mg via INTRAVENOUS

## 2013-04-04 MED ORDER — SODIUM CHLORIDE 0.9 % IJ SOLN
10.0000 mL | INTRAMUSCULAR | Status: DC | PRN
  Administered 2013-04-04: 10 mL
  Filled 2013-04-04: qty 10

## 2013-04-04 MED ORDER — ONDANSETRON 16 MG/50ML IVPB (CHCC)
INTRAVENOUS | Status: AC
  Filled 2013-04-04: qty 16

## 2013-04-04 MED ORDER — SODIUM CHLORIDE 0.9 % IV SOLN
Freq: Once | INTRAVENOUS | Status: AC
  Administered 2013-04-04: 12:00:00 via INTRAVENOUS

## 2013-04-04 MED ORDER — FAMOTIDINE IN NACL 20-0.9 MG/50ML-% IV SOLN
INTRAVENOUS | Status: AC
  Filled 2013-04-04: qty 50

## 2013-04-04 MED ORDER — SODIUM CHLORIDE 0.9 % IV SOLN
45.0000 mg/m2 | Freq: Once | INTRAVENOUS | Status: AC
  Administered 2013-04-04: 96 mg via INTRAVENOUS
  Filled 2013-04-04: qty 16

## 2013-04-04 MED ORDER — HEPARIN SOD (PORK) LOCK FLUSH 100 UNIT/ML IV SOLN
250.0000 [IU] | Freq: Once | INTRAVENOUS | Status: AC | PRN
  Administered 2013-04-04: 250 [IU]
  Filled 2013-04-04: qty 5

## 2013-04-04 MED ORDER — SODIUM CHLORIDE 0.9 % IV SOLN
276.0000 mg | Freq: Once | INTRAVENOUS | Status: AC
  Administered 2013-04-04: 280 mg via INTRAVENOUS
  Filled 2013-04-04: qty 28

## 2013-04-04 MED ORDER — DEXAMETHASONE SODIUM PHOSPHATE 20 MG/5ML IJ SOLN
20.0000 mg | Freq: Once | INTRAMUSCULAR | Status: AC
  Administered 2013-04-04: 20 mg via INTRAVENOUS

## 2013-04-04 MED ORDER — DIPHENHYDRAMINE HCL 50 MG/ML IJ SOLN
INTRAMUSCULAR | Status: AC
  Filled 2013-04-04: qty 1

## 2013-04-04 MED ORDER — DEXAMETHASONE SODIUM PHOSPHATE 20 MG/5ML IJ SOLN
INTRAMUSCULAR | Status: AC
  Filled 2013-04-04: qty 5

## 2013-04-04 MED ORDER — ONDANSETRON 16 MG/50ML IVPB (CHCC)
16.0000 mg | Freq: Once | INTRAVENOUS | Status: AC
  Administered 2013-04-04: 16 mg via INTRAVENOUS

## 2013-04-04 MED ORDER — DIPHENHYDRAMINE HCL 50 MG/ML IJ SOLN
50.0000 mg | Freq: Once | INTRAMUSCULAR | Status: AC
  Administered 2013-04-04: 50 mg via INTRAVENOUS

## 2013-04-04 NOTE — Telephone Encounter (Signed)
Per staff message and POF I have scheduled appts. advised scheduler to move lab aappt  JMW

## 2013-04-04 NOTE — Progress Notes (Signed)
Put [employer] form on nurse's desk.

## 2013-04-04 NOTE — Progress Notes (Signed)
[place] [phone]   [phone]  OFFICE PROGRESS NOTE  [name], MD [address] [facility] [place] [address]  DIAGNOSIS: Stage IIIB (T2b, N3, M0) non-small cell lung cancer, invasive squamous cell carcinoma diagnosed in December of 2014. Primary site: Lung (Right)  Staging method: AJCC 7th Edition  Clinical: Stage IIIB (T2b, N3, M0)  Summary: Stage IIIB (T2b, N3, M0)   PRIOR THERAPY: None   CURRENT THERAPY: Concurrent chemoradiation with weekly carboplatin for an AUC of 2 and paclitaxel at 45 mg per meter square for total of 6-7 weeks depending on the final dose of radiation. She status post 3 cycles.  DISEASE STAGE:  Non-small cell cancer of right lung  Primary site: Lung (Right)  Staging method: AJCC 7th Edition  Clinical: Stage IIIB (T2b, N3, M0)  Summary: Stage IIIB (T2b, N3, M0)  CHEMOTHERAPY INTENT: control/palliative  CURRENT # OF CHEMOTHERAPY CYCLES: 4 CURRENT ANTIEMETICS: Dexamethasone, Zofran, Compazine  CURRENT SMOKING STATUS: Former smoker, quit 02/19/1996  ORAL CHEMOTHERAPY AND CONSENT: n/a  CURRENT BISPHOSPHONATES USE: None  PAIN MANAGEMENT: oxycodone  NARCOTICS INDUCED CONSTIPATION: nine  LIVING WILL AND CODE STATUS: ?   INTERVAL HISTORY: Meagan Garcia 63 y.o. female returns to the clinic today for followup visit accompanied by her brother. The patient is tolerating her current treatment with concurrent chemoradiation fairly well with no significant adverse effects. She denied having any fever or chills. She has no nausea or vomiting. The patient denied having any significant weight loss or night sweats. She has no chest pain but has shortness breath with exertion with mild cough productive of dark sputum and no hemoptysis. The patient is here today to start cycle #4 of her concurrent chemoradiation.  MEDICAL HISTORY: Past Medical History  Diagnosis Date  . Anxiety   . Depression     . Peripheral neuropathy     feet: unknown etiol  . Elevated glucose   . Allergic rhinitis   . GERD (gastroesophageal reflux disease)   . Complication of anesthesia w/tubal    got anest. "went crazy"   . Non-small cell lung cancer     ALLERGIES:  has No Known Allergies.  MEDICATIONS:  Current Outpatient Prescriptions  Medication Sig Dispense Refill  . albuterol (PROVENTIL HFA;VENTOLIN HFA) 108 (90 BASE) MCG/ACT inhaler Inhale 2 puffs into the lungs every 6 (six) hours as needed for wheezing or shortness of breath.  1 Inhaler  2  . budesonide-formoterol (SYMBICORT) 160-4.5 MCG/ACT inhaler Inhale 2 puffs into the lungs 2 (two) times daily.      Marland Kitchen buPROPion (WELLBUTRIN SR) 150 MG 12 hr tablet Take 150 mg by mouth daily.      . cholecalciferol (VITAMIN D) 1000 UNITS tablet Take 1 tablet (1,000 Units total) by mouth daily.  30 tablet  0  . enoxaparin (LOVENOX) 150 MG/ML injection Inject 150 mg into the skin daily.      . fluticasone (FLONASE) 50 MCG/ACT nasal spray Place 2 sprays into the nose daily.  48 g  3  . hyaluronate sodium (RADIAPLEXRX) GEL Apply 1 application topically 2 (two) times daily. Apply to chest area being treated with radiation after rad tx and bedtime when skin becomes itchy or irritation/erythema  And on weekedns as well      . LORazepam (ATIVAN) 1 MG tablet TAKE ONE TABLET BY MOUTH ONCE DAILY      . nebivolol (BYSTOLIC) 10 MG tablet Take 10 mg  by mouth daily.      Marland Kitchen tiotropium (SPIRIVA HANDIHALER) 18 MCG inhalation capsule Place 1 capsule (18 mcg total) into inhaler and inhale daily.  30 capsule  0  . oxyCODONE (OXY IR/ROXICODONE) 5 MG immediate release tablet Take 1 tablet (5 mg total) by mouth every 6 (six) hours as needed for severe pain.  30 tablet  0  . prochlorperazine (COMPAZINE) 10 MG tablet Take 1 tablet (10 mg total) by mouth every 6 (six) hours as needed for nausea or vomiting.  60 tablet  0  . sucralfate (CARAFATE) 1 GM/10ML suspension Take 10 mLs (1 g total)  by mouth 4 (four) times daily -  with meals and at bedtime.  420 mL  0   No current facility-administered medications for this visit.    SURGICAL HISTORY:  Past Surgical History  Procedure Laterality Date  . Abdominal hysterectomy      partial  . Tubal reversal    . Tubal ligation      x2  . Fibroid tumors    . Knee arthroscopy    . Foot surgery      cysts from both feet  . Video bronchoscopy Bilateral 02/21/2013    Procedure: VIDEO BRONCHOSCOPY WITHOUT FLUORO;  Surgeon: Doree Fudge, MD;  Location: [facility];  Service: Cardiopulmonary;  Laterality: Bilateral;    REVIEW OF SYSTEMS:  A comprehensive review of systems was negative except for: Respiratory: positive for cough and dyspnea on exertion   PHYSICAL EXAMINATION: General appearance: alert, cooperative and no distress Head: Normocephalic, without obvious abnormality, atraumatic Neck: no adenopathy, no JVD, supple, symmetrical, trachea midline and thyroid not enlarged, symmetric, no tenderness/mass/nodules Lymph nodes: Cervical, supraclavicular, and axillary nodes normal. Resp: clear to auscultation bilaterally Back: symmetric, no curvature. ROM normal. No CVA tenderness. Cardio: regular rate and rhythm, S1, S2 normal, no murmur, click, rub or gallop GI: soft, non-tender; bowel sounds normal; no masses,  no organomegaly Extremities: extremities normal, atraumatic, no cyanosis or edema  ECOG PERFORMANCE STATUS: 1 - Symptomatic but completely ambulatory  Blood pressure 132/66, pulse 102, temperature 98.1 F (36.7 C), resp. rate 18, height 5\' 3"  (1.6 m), weight 206 lb 1.6 oz (93.486 kg), SpO2 99.00%.  LABORATORY DATA: Lab Results  Component Value Date   WBC 3.3* 04/04/2013   HGB 10.7* 04/04/2013   HCT 34.2* 04/04/2013   MCV 88.8 04/04/2013   PLT 223 04/04/2013      Chemistry      Component Value Date/Time   NA 138 03/28/2013 1002   NA 135* 03/16/2013 0510   K 4.0 03/28/2013 1002   K 4.2 03/16/2013 0510   CL  99 03/16/2013 0510   CO2 23 03/28/2013 1002   CO2 24 03/16/2013 0510   BUN 11.2 03/28/2013 1002   BUN 19 03/16/2013 0510   CREATININE 0.7 03/28/2013 1002   CREATININE 0.77 03/16/2013 0510      Component Value Date/Time   CALCIUM 9.5 03/28/2013 1002   CALCIUM 9.5 03/16/2013 0510   ALKPHOS 81 03/28/2013 1002   ALKPHOS 150* 03/07/2013 1210   AST 19 03/28/2013 1002   AST 45* 03/07/2013 1210   ALT 59* 03/28/2013 1002   ALT 89* 03/07/2013 1210   BILITOT 0.33 03/28/2013 1002   BILITOT 0.8 03/07/2013 1210       RADIOGRAPHIC STUDIES: Dg Chest 2 View  03/14/2013   CLINICAL DATA:  History of lung cancer, followup atelectasis  EXAM: CHEST  2 VIEW  COMPARISON:  03/09/2013  FINDINGS: The cardiac  shadow is stable. The left lung remains clear. A right-sided PICC line is again identified in satisfactory position. Persistent elevation of the right hemidiaphragm with consolidation of the right lung base is noted. A small pleural effusion is noted as well. The overall appearance is stable from the prior exam.  IMPRESSION: No significant change from the prior study.   Electronically Signed   By: Inez Catalina M.D.   On: 03/14/2013 08:04   Dg Chest Port 1 View  03/09/2013   CLINICAL DATA:  Atelectasis.  EXAM: PORTABLE CHEST - 1 VIEW  COMPARISON:  03/08/2013.  FINDINGS: Persistent atelectasis of the right lung is noted. Underlying pleural effusion cannot be excluded. There is shift of the mediastinum and heart from left to right. Left lung is clear. Cardiac silhouette cannot be evaluated. Pulmonary vascularity is normal. No acute osseous abnormality.  IMPRESSION: Persistent atelectasis right lung. Underlying pleural effusion cannot be excluded.   Electronically Signed   By: Marcello Moores  Register   On: 03/09/2013 06:49   Dg Chest Port 1 View  03/08/2013   CLINICAL DATA:  Atelectasis  EXAM: PORTABLE CHEST - 1 VIEW  COMPARISON:  Prior chest x-ray and CT PE study 03/07/2013  FINDINGS: Interval development of whiteout of the right hemi  thorax with left-to-right shift of the cardiac and mediastinal structures. There is abrupt cut off of the right main stem bronchus. The left lung remains well it simply well aerated although there is diffuse interstitial prominence which remains unchanged compared to the recent prior imaging. No acute osseous abnormality. No pneumothorax.  IMPRESSION: Interval obstruction of the right main stem bronchus resulting in complete atelectasis of the right lung. Resultant volume loss with left-to-right shift of the cardiac and mediastinal structures.  Given the rapid interval change, mucous plugging in the narrowed right main stem bronchus is the most likely etiology.  These results were called by telephone at the time of interpretation on 03/08/2013 at 8:15 AM to Dr. Joya Gaskins, who verbally acknowledged these results.   Electronically Signed   By: Jacqulynn Cadet M.D.   On: 03/08/2013 08:15   Dg Chest Port 1 View  03/07/2013   CLINICAL DATA:  Shortness of breath and difficulty breathing  EXAM: PORTABLE CHEST - 1 VIEW  COMPARISON:  02/18/2013  FINDINGS: There is again noted fullness in the hilum and right suprahilar at/ peritracheal region consistent with the given history of centrally obstructing mass lesion. Increasing consolidation is noted in the right lower lobe. The left lung is clear.  IMPRESSION: Persistent changes consistent with the given clinical history. There is increasing right basilar consolidation identified likely related to post obstructive changes.   Electronically Signed   By: Inez Catalina M.D.   On: 03/07/2013 12:41    ASSESSMENT AND PLAN: This is a very pleasant 63 years old [place] American female with a stage IIIB non-small cell lung cancer currently undergoing a course of concurrent chemoradiation with weekly carboplatin and paclitaxel status post 3 cycles. The patient is feeling fine with no specific complaints except for shortness of breath with exertion. She is tolerating her treatment  fairly well. I recommended for the patient to proceed with cycle #4 today as scheduled. She would come back for followup visit in 2 weeks for evaluation and management any adverse effect of her treatment. She was advised to call immediately she has any concerning symptoms in the interval.  The patient voices understanding of current disease status and treatment options and is in agreement with the current  care plan.  All questions were answered. The patient knows to call the clinic with any problems, questions or concerns. We can certainly see the patient much sooner if necessary.  Disclaimer: This note was dictated with voice recognition software. Similar sounding words can inadvertently be transcribed and may not be corrected upon review.

## 2013-04-04 NOTE — Telephone Encounter (Signed)
gave pt appt for lab,md and emailed Sharyn Lull regarding chemo for February 2015

## 2013-04-04 NOTE — Patient Instructions (Signed)
[facility] Discharge Instructions for Patients Receiving Chemotherapy  Today you received the following chemotherapy agents Taxol/Carboplatin To help prevent nausea and vomiting after your treatment, we encourage you to take your nausea medication as prescribed.  If you develop nausea and vomiting that is not controlled by your nausea medication, call the clinic.   BELOW ARE SYMPTOMS THAT SHOULD BE REPORTED IMMEDIATELY:  *FEVER GREATER THAN 100.5 F  *CHILLS WITH OR WITHOUT FEVER  NAUSEA AND VOMITING THAT IS NOT CONTROLLED WITH YOUR NAUSEA MEDICATION  *UNUSUAL SHORTNESS OF BREATH  *UNUSUAL BRUISING OR BLEEDING  TENDERNESS IN MOUTH AND THROAT WITH OR WITHOUT PRESENCE OF ULCERS  *URINARY PROBLEMS  *BOWEL PROBLEMS  UNUSUAL RASH Items with * indicate a potential emergency and should be followed up as soon as possible.  Feel free to call the clinic you have any questions or concerns. The clinic phone number is [phone].    

## 2013-04-04 NOTE — Patient Instructions (Signed)
Followup visit in 2 weeks. 

## 2013-04-05 ENCOUNTER — Telehealth: Payer: Self-pay | Admitting: Internal Medicine

## 2013-04-05 ENCOUNTER — Ambulatory Visit
Admission: RE | Admit: 2013-04-05 | Discharge: 2013-04-05 | Disposition: A | Discharge status: Home / Self Care | Payer: BC Managed Care – PPO | Source: Ambulatory Visit | Attending: Radiation Oncology | Admitting: Radiation Oncology

## 2013-04-05 ENCOUNTER — Other Ambulatory Visit: Payer: Self-pay | Admitting: *Deleted

## 2013-04-05 VITALS — BP 125/70 | HR 101 | Temp 97.6°F | Ht 63.0 in | Wt 208.3 lb

## 2013-04-05 DIAGNOSIS — C3491 Malignant neoplasm of unspecified part of right bronchus or lung: Secondary | ICD-10-CM

## 2013-04-05 DIAGNOSIS — I2699 Other pulmonary embolism without acute cor pulmonale: Secondary | ICD-10-CM

## 2013-04-05 MED ORDER — ENOXAPARIN SODIUM 150 MG/ML ~~LOC~~ SOLN: 150.0000 mg | SUBCUTANEOUS | Status: DC

## 2013-04-05 NOTE — Progress Notes (Signed)
[facility]     Rexene Edison, M.D. [place], [place] [address]               Blair Promise, M.D., Ph.D. Phone: [phone]      [place] A. Tammi Klippel, M.D. Fax: [phone]      Jodelle Gross, M.D., Ph.D.         Thea Silversmith, M.D.         Wyvonnia Lora, M.D Weekly Treatment Management Note  Name: Meagan Garcia     MRN: [id number]        CSN: [id number] Date: 04/05/2013      DOB: 06/25/1950  CC: [name], MD         [place]    Status: Outpatient  Diagnosis: The encounter diagnosis was Non-small cell cancer of right lung.  Current Dose: 41.4 Gy  Current Fraction: 23  Planned Dose: 63 Gy  Narrative: [name] was seen today for weekly treatment management. The chart was checked and CBCT  were reviewed. She is doing well. She has minimal discomfort with swallowing. She is able to eat solid foods at this time. Her breathing is much improved. She denies any itching in the radiation portals.  Review of patient's allergies indicates no known allergies. Current Outpatient Prescriptions  Medication Sig Dispense Refill  . albuterol (PROVENTIL HFA;VENTOLIN HFA) 108 (90 BASE) MCG/ACT inhaler Inhale 2 puffs into the lungs every 6 (six) hours as needed for wheezing or shortness of breath.  1 Inhaler  2  . budesonide-formoterol (SYMBICORT) 160-4.5 MCG/ACT inhaler Inhale 2 puffs into the lungs 2 (two) times daily.      Marland Kitchen buPROPion (WELLBUTRIN SR) 150 MG 12 hr tablet Take 150 mg by mouth daily.      . cholecalciferol (VITAMIN D) 1000 UNITS tablet Take 1 tablet (1,000 Units total) by mouth daily.  30 tablet  0  . enoxaparin (LOVENOX) 150 MG/ML injection Inject 150 mg into the skin daily.      . fluticasone (FLONASE) 50 MCG/ACT nasal spray Place 2 sprays into the nose daily.  48 g  3  . LORazepam (ATIVAN) 1 MG tablet TAKE ONE TABLET BY MOUTH ONCE DAILY      . nebivolol (BYSTOLIC) 10 MG tablet Take 10 mg by mouth  daily.      . prochlorperazine (COMPAZINE) 10 MG tablet Take 1 tablet (10 mg total) by mouth every 6 (six) hours as needed for nausea or vomiting.  60 tablet  0  . tiotropium (SPIRIVA HANDIHALER) 18 MCG inhalation capsule Place 1 capsule (18 mcg total) into inhaler and inhale daily.  30 capsule  0  . hyaluronate sodium (RADIAPLEXRX) GEL Apply 1 application topically 2 (two) times daily. Apply to chest area being treated with radiation after rad tx and bedtime when skin becomes itchy or irritation/erythema  And on weekedns as well      . oxyCODONE (OXY IR/ROXICODONE) 5 MG immediate release tablet Take 1 tablet (5 mg total) by mouth every 6 (six) hours as needed for severe pain.  30 tablet  0  . sucralfate (CARAFATE) 1 GM/10ML suspension Take 10 mLs (1 g total) by mouth 4 (four) times daily -  with meals and at bedtime.  420 mL  0   No current facility-administered medications for this encounter.   Labs:  Lab Results  Component Value Date   WBC 3.3* 04/04/2013   HGB 10.7* 04/04/2013  HCT 34.2* 04/04/2013   MCV 88.8 04/04/2013   PLT 223 04/04/2013   Lab Results  Component Value Date   CREATININE 0.8 04/04/2013   BUN 6.0* 04/04/2013   NA 137 04/04/2013   K 4.0 04/04/2013   CL 99 03/16/2013   CO2 24 04/04/2013   Lab Results  Component Value Date   ALT 53 04/04/2013   AST 24 04/04/2013   PHOS 2.4 03/12/2013   BILITOT 0.50 04/04/2013    Physical Examination:  Filed Vitals:   04/05/13 1127  BP: 125/70  Pulse: 101  Temp: 97.6 F (36.4 C)    Wt Readings from Last 3 Encounters:  04/05/13 208 lb 4.8 oz (94.484 kg)  04/04/13 206 lb 1.6 oz (93.486 kg)  03/29/13 210 lb 6.4 oz (95.437 kg)     Lungs - Normal respiratory effort, chest expands symmetrically. Lungs are clear to auscultation, no crackles or wheezes.  Heart has regular rhythm and rate  Abdomen is soft and non tender with normal bowel sounds  Assessment:  Patient tolerating treatments well  Plan: Continue treatment per original  radiation prescription along with radiosensitizing chemotherapy

## 2013-04-05 NOTE — Progress Notes (Addendum)
Meagan Garcia has had 23 fractions to her right lung.  She denies pain.  She reports that she has occasional coughed up blood.  She is taking lovenox 150 units daily.  She reports that her shortness of breath is improving.  Her oxygen saturation today is 98% on room air.  She reports a sore throat but says she is able to eat what she wants.  She reports that the Carafate made her sick to her stomach.  She had chemotherapy yesterday.  Her skin is intact on her right chest and right upper back.

## 2013-04-05 NOTE — Telephone Encounter (Signed)
Talked to pt and gave her appt for February lab,md and chemo, advised pt to get appt calendar tomorrow

## 2013-04-06 ENCOUNTER — Ambulatory Visit: Admission: RE | Admit: 2013-04-06 | Payer: BC Managed Care – PPO | Source: Ambulatory Visit

## 2013-04-06 ENCOUNTER — Encounter: Payer: Self-pay | Admitting: Internal Medicine

## 2013-04-06 ENCOUNTER — Ambulatory Visit (HOSPITAL_BASED_OUTPATIENT_CLINIC_OR_DEPARTMENT_OTHER): Payer: BC Managed Care – PPO

## 2013-04-06 ENCOUNTER — Ambulatory Visit (INDEPENDENT_AMBULATORY_CARE_PROVIDER_SITE_OTHER): Payer: BC Managed Care – PPO | Admitting: Internal Medicine

## 2013-04-06 VITALS — BP 120/66 | HR 80 | Temp 97.7°F | Resp 16 | Wt 211.0 lb

## 2013-04-06 VITALS — BP 125/61 | HR 95 | Temp 97.7°F

## 2013-04-06 DIAGNOSIS — C3491 Malignant neoplasm of unspecified part of right bronchus or lung: Secondary | ICD-10-CM

## 2013-04-06 DIAGNOSIS — F411 Generalized anxiety disorder: Secondary | ICD-10-CM

## 2013-04-06 DIAGNOSIS — F329 Major depressive disorder, single episode, unspecified: Secondary | ICD-10-CM

## 2013-04-06 DIAGNOSIS — C3400 Malignant neoplasm of unspecified main bronchus: Secondary | ICD-10-CM

## 2013-04-06 DIAGNOSIS — R0789 Other chest pain: Secondary | ICD-10-CM

## 2013-04-06 DIAGNOSIS — Z452 Encounter for adjustment and management of vascular access device: Secondary | ICD-10-CM

## 2013-04-06 DIAGNOSIS — J189 Pneumonia, unspecified organism: Secondary | ICD-10-CM

## 2013-04-06 DIAGNOSIS — F3289 Other specified depressive episodes: Secondary | ICD-10-CM

## 2013-04-06 DIAGNOSIS — R072 Precordial pain: Secondary | ICD-10-CM

## 2013-04-06 DIAGNOSIS — C3490 Malignant neoplasm of unspecified part of unspecified bronchus or lung: Secondary | ICD-10-CM

## 2013-04-06 DIAGNOSIS — I10 Essential (primary) hypertension: Secondary | ICD-10-CM

## 2013-04-06 MED ORDER — OMEPRAZOLE 40 MG PO CPDR: 40.0000 mg | DELAYED_RELEASE_CAPSULE | Freq: Every day | ORAL | Status: DC

## 2013-04-06 MED ORDER — NEBIVOLOL HCL 10 MG PO TABS: 10.0000 mg | ORAL_TABLET | Freq: Every day | ORAL | Status: AC

## 2013-04-06 MED ORDER — TRIAZOLAM 0.25 MG PO TABS: 0.2500 mg | ORAL_TABLET | Freq: Every evening | ORAL | Status: DC | PRN

## 2013-04-06 MED ORDER — BUDESONIDE-FORMOTEROL FUMARATE 160-4.5 MCG/ACT IN AERO: 2.0000 | INHALATION_SPRAY | Freq: Two times a day (BID) | RESPIRATORY_TRACT | Status: AC

## 2013-04-06 MED ORDER — ALBUTEROL SULFATE HFA 108 (90 BASE) MCG/ACT IN AERS: 2.0000 | INHALATION_SPRAY | Freq: Four times a day (QID) | RESPIRATORY_TRACT | Status: AC | PRN

## 2013-04-06 MED ORDER — TIOTROPIUM BROMIDE MONOHYDRATE 18 MCG IN CAPS: 18.0000 ug | ORAL_CAPSULE | Freq: Every day | RESPIRATORY_TRACT | Status: AC

## 2013-04-06 MED ORDER — SODIUM CHLORIDE 0.9 % IJ SOLN
10.0000 mL | INTRAMUSCULAR | Status: DC | PRN
  Administered 2013-04-06: 10 mL via INTRAVENOUS
  Filled 2013-04-06: qty 10

## 2013-04-06 MED ORDER — HEPARIN SOD (PORK) LOCK FLUSH 100 UNIT/ML IV SOLN
500.0000 [IU] | Freq: Once | INTRAVENOUS | Status: AC
  Administered 2013-04-06: 250 [IU] via INTRAVENOUS
  Filled 2013-04-06: qty 5

## 2013-04-06 NOTE — Assessment & Plan Note (Signed)
1/15 poss XRT esophagitis

## 2013-04-06 NOTE — Assessment & Plan Note (Signed)
Continue with current prescription therapy as reflected on the Med list.  

## 2013-04-06 NOTE — Progress Notes (Signed)
Faxed disability form to [employer] @ [phone].

## 2013-04-06 NOTE — Assessment & Plan Note (Addendum)
DIAGNOSIS: Stage IIIB (T2b, N3, M0) non-small cell lung cancer, invasive squamous cell carcinoma diagnosed in December of 2014.  Primary site: Lung (Right)  Staging method: AJCC 7th Edition  Clinical: Stage IIIB (T2b, N3, M0)  Summary: Stage IIIB (T2b, N3, M0)  A complex case - discussed w/pt and her son >45 min  Chemo+XRT

## 2013-04-06 NOTE — Patient Instructions (Signed)
PICC Home Guide A peripherally inserted central catheter (PICC) is a long, thin, flexible tube that is inserted into a vein in the upper arm. It is a form of intravenous (IV) access. It is considered to be a "central" line because the tip of the PICC ends in a large vein in your chest. This large vein is called the superior vena cava (SVC). The PICC tip ends in the SVC because there is a lot of blood flow in the SVC. This allows medicines and IV fluids to be quickly distributed throughout the body. The PICC is inserted using a sterile technique by a specially trained nurse or physician. After the PICC is inserted, a chest X-ray exam is done to be sure it is in the correct place.  A PICC may be placed for different reasons, such as:  To give medicines and liquid nutrition that can only be given through a central line. Examples are:  Certain antibiotic treatments.  Chemotherapy.  Total parenteral nutrition (TPN).  To take frequent blood samples.  To give IV fluids and blood products.  If there is difficulty placing a peripheral intravenous (PIV) catheter. If taken care of properly, a PICC can remain in place for several months. A PICC can also allow a person to go home from the hospital early. Medicine and PICC care can be managed at home by a family member or home health care team. WHAT PROBLEMS CAN HAPPEN WHEN I HAVE A PICC? Problems with a PICC can occasionally occur. These may include:  A blood clot (thrombus) forming in or at the tip of the PICC. This can cause the PICC to become clogged. A clot-dissolving medicine called tissue plasminogen activator (tPA) can be given through the PICC to help break up the clot.  Inflammation of the vein (phlebitis) in which the PICC is placed. Signs of inflammation may include redness, pain at the insertion site, red streaks, or being able to feel a "cord" in the vein where the PICC is located.  Infection in the PICC or at the insertion site. Signs of  infection may include fever, chills, redness, swelling, or pus drainage from the PICC insertion site.  PICC movement (malposition). The PICC tip may move from its original position due to excessive physical activity, forceful coughing, sneezing, or vomiting.  A break or cut in the PICC. It is important to not use scissors near the PICC.  Nerve or tendon irritation or injury during PICC insertion. WHAT SHOULD I KEEP IN MIND ABOUT ACTIVITIES WHEN I HAVE A PICC?  You may bend your arm and move it freely. If your PICC is near or at the bend of your elbow, avoid activity with repeated motion at the elbow.  Rest at home for the remainder of the day following PICC line insertion.  Avoid lifting heavy objects as instructed by your health care provider.  Avoid using a crutch with the arm on the same side as your PICC. You may need to use a walker. WHAT SHOULD I KNOW ABOUT MY PICC DRESSING?  Keep your PICC bandage (dressing) clean and dry to prevent infection.  Ask your health care provider when you may shower. Ask your health care provider to teach you how to wrap the PICC when you do take a shower.  Change the PICC dressing as instructed by your health care provider.  Change your PICC dressing if it becomes loose or wet. WHAT SHOULD I KNOW ABOUT PICC CARE?  Check the PICC insertion site daily for   leakage, redness, swelling, or pain.  Do not take a bath, swim, or use hot tubs when you have a PICC. Cover PICC line with clear plastic wrap and tape to keep it dry while showering.  Flush the PICC as directed by your health care provider. Let your health care provider know right away if the PICC is difficult to flush or does not flush. Do not use force to flush the PICC.  Do not use a syringe that is less than 10 mL to flush the PICC.  Never pull or tug on the PICC.  Avoid blood pressure checks on the arm with the PICC.  Keep your PICC identification card with you at all times.  Do not  take the PICC out yourself. Only a trained clinical professional should remove the PICC. SEEK IMMEDIATE MEDICAL CARE IF:  Your PICC is accidently pulled all the way out. If this happens, cover the insertion site with a bandage or gauze dressing. Do not throw the PICC away. Your health care provider will need to inspect it.  Your PICC was tugged or pulled and has partially come out. Do not  push the PICC back in.  There is any type of drainage, redness, or swelling where the PICC enters the skin.  You cannot flush the PICC, it is difficult to flush, or the PICC leaks around the insertion site when it is flushed.  You hear a "flushing" sound when the PICC is flushed.  You have pain, discomfort, or numbness in your arm, shoulder, or jaw on the same side as the PICC.  You feel your heart "racing" or skipping beats.  You notice a hole or tear in the PICC.  You develop chills or a fever. MAKE SURE YOU:   Understand these instructions.  Will watch your condition.  Will get help right away if you are not doing well or get worse. Document Released: 08/31/2002 Document Revised: 12/15/2012 Document Reviewed: 11/01/2012 [employer] Patient Information 2014 [employer].  

## 2013-04-06 NOTE — Progress Notes (Signed)
Pre visit review using our clinic review tool, if applicable. No additional management support is needed unless otherwise documented below in the visit note. 

## 2013-04-06 NOTE — Progress Notes (Signed)
Subjective:    Patient ID: Meagan Garcia, female    DOB: [birth year], 63 y.o.   MRN: [id number]  HPI  DIAGNOSIS: Stage IIIB (T2b, N3, M0) non-small cell lung cancer, invasive squamous cell carcinoma diagnosed in December of 2014.  Primary site: Lung (Right)  Staging method: AJCC 7th Edition  Clinical: Stage IIIB (T2b, N3, M0)  Summary: Stage IIIB (T2b, N3, M0) CURRENT THERAPY: Concurrent chemoradiation with weekly carboplatin for an AUC of 2 and paclitaxel at 45 mg per meter square for total of 6-7 weeks depending on the final dose of radiation. XRT  C/o CP w/swallowing, acid. C/o insomnia  Review of Systems  Constitutional: Negative for chills, activity change, appetite change, fatigue and unexpected weight change.  HENT: Negative for congestion, mouth sores and sinus pressure.   Eyes: Negative for visual disturbance.  Respiratory: Positive for shortness of breath and wheezing. Negative for cough and chest tightness.   Cardiovascular: Negative for chest pain.  Gastrointestinal: Negative for nausea and abdominal pain.  Genitourinary: Negative for frequency, difficulty urinating and vaginal pain.  Musculoskeletal: Negative for back pain and gait problem.  Skin: Negative for pallor and rash.  Neurological: Negative for dizziness, tremors, weakness, numbness and headaches.  Psychiatric/Behavioral: Negative for suicidal ideas, confusion and sleep disturbance. The patient is nervous/anxious.    Wt Readings from Last 3 Encounters:  04/06/13 211 lb (95.709 kg)  04/05/13 208 lb 4.8 oz (94.484 kg)  04/04/13 206 lb 1.6 oz (93.486 kg)   BP Readings from Last 3 Encounters:  04/06/13 120/66  04/05/13 125/70  04/04/13 132/66        Objective:   Physical Exam  Constitutional: She appears well-developed. No distress.  Obese  NAD  HENT:  Head: Normocephalic.  Right Ear: External ear normal.  Left Ear: External ear normal.  Nose: Nose normal.  Mouth/Throat: Oropharynx is clear  and moist.  Eyes: Conjunctivae are normal. Pupils are equal, round, and reactive to light. Right eye exhibits no discharge. Left eye exhibits no discharge.  Neck: Normal range of motion. Neck supple. No JVD present. No tracheal deviation present. No thyromegaly present.  Cardiovascular: Normal rate, regular rhythm and normal heart sounds.   Pulmonary/Chest: No stridor. No respiratory distress. She has no wheezes.  Abdominal: Soft. Bowel sounds are normal. She exhibits no distension and no mass. There is no tenderness. There is no rebound and no guarding.  Musculoskeletal: She exhibits no edema and no tenderness.  Lymphadenopathy:    She has no cervical adenopathy.  Neurological: She displays normal reflexes. No cranial nerve deficit. She exhibits normal muscle tone. Coordination normal.  Skin: No rash noted. She is not diaphoretic. No erythema.  Psychiatric: She has a normal mood and affect. Her behavior is normal. Judgment and thought content normal.    Lab Results  Component Value Date   WBC 3.3* 04/04/2013   HGB 10.7* 04/04/2013   HCT 34.2* 04/04/2013   PLT 223 04/04/2013   GLUCOSE 90 04/04/2013   CHOL 201* 08/20/2009   TRIG 88.0 08/20/2009   HDL 41.50 08/20/2009   LDLDIRECT 149.7 08/20/2009   ALT 53 04/04/2013   AST 24 04/04/2013   NA 137 04/04/2013   K 4.0 04/04/2013   CL 99 03/16/2013   CREATININE 0.8 04/04/2013   BUN 6.0* 04/04/2013   CO2 24 04/04/2013   TSH 1.76 08/20/2009   INR 1.12 02/20/2013   HGBA1C 5.6 06/24/2007          Assessment & Plan:

## 2013-04-07 ENCOUNTER — Ambulatory Visit
Admission: RE | Admit: 2013-04-07 | Discharge: 2013-04-07 | Disposition: A | Discharge status: Home / Self Care | Payer: BC Managed Care – PPO | Source: Ambulatory Visit | Attending: Radiation Oncology | Admitting: Radiation Oncology

## 2013-04-08 ENCOUNTER — Ambulatory Visit
Admission: RE | Admit: 2013-04-08 | Discharge: 2013-04-08 | Disposition: A | Discharge status: Home / Self Care | Payer: BC Managed Care – PPO | Source: Ambulatory Visit | Attending: Radiation Oncology | Admitting: Radiation Oncology

## 2013-04-08 ENCOUNTER — Ambulatory Visit (HOSPITAL_BASED_OUTPATIENT_CLINIC_OR_DEPARTMENT_OTHER): Payer: BC Managed Care – PPO

## 2013-04-08 ENCOUNTER — Encounter: Payer: Self-pay | Admitting: Critical Care Medicine

## 2013-04-08 ENCOUNTER — Ambulatory Visit (INDEPENDENT_AMBULATORY_CARE_PROVIDER_SITE_OTHER): Payer: BC Managed Care – PPO | Admitting: Critical Care Medicine

## 2013-04-08 VITALS — BP 112/72 | HR 99 | Temp 98.6°F | Ht 63.0 in | Wt 208.0 lb

## 2013-04-08 VITALS — BP 102/56 | HR 93 | Temp 98.9°F

## 2013-04-08 DIAGNOSIS — C3400 Malignant neoplasm of unspecified main bronchus: Secondary | ICD-10-CM

## 2013-04-08 DIAGNOSIS — C3491 Malignant neoplasm of unspecified part of right bronchus or lung: Secondary | ICD-10-CM

## 2013-04-08 DIAGNOSIS — I2699 Other pulmonary embolism without acute cor pulmonale: Secondary | ICD-10-CM

## 2013-04-08 DIAGNOSIS — Z87891 Personal history of nicotine dependence: Secondary | ICD-10-CM

## 2013-04-08 DIAGNOSIS — J9819 Other pulmonary collapse: Secondary | ICD-10-CM

## 2013-04-08 DIAGNOSIS — Z452 Encounter for adjustment and management of vascular access device: Secondary | ICD-10-CM

## 2013-04-08 DIAGNOSIS — C3490 Malignant neoplasm of unspecified part of unspecified bronchus or lung: Secondary | ICD-10-CM

## 2013-04-08 MED ORDER — HEPARIN SOD (PORK) LOCK FLUSH 100 UNIT/ML IV SOLN
500.0000 [IU] | Freq: Once | INTRAVENOUS | Status: AC
  Administered 2013-04-08: 250 [IU] via INTRAVENOUS
  Filled 2013-04-08: qty 5

## 2013-04-08 MED ORDER — SODIUM CHLORIDE 0.9 % IJ SOLN
10.0000 mL | INTRAMUSCULAR | Status: DC | PRN
  Administered 2013-04-08: 10 mL via INTRAVENOUS
  Filled 2013-04-08: qty 10

## 2013-04-08 MED ORDER — HYDROCODONE-HOMATROPINE 5-1.5 MG/5ML PO SYRP: 5.0000 mL | ORAL_SOLUTION | Freq: Four times a day (QID) | ORAL | Status: DC | PRN

## 2013-04-08 NOTE — Patient Instructions (Signed)
PICC Home Guide A peripherally inserted central catheter (PICC) is a long, thin, flexible tube that is inserted into a vein in the upper arm. It is a form of intravenous (IV) access. It is considered to be a "central" line because the tip of the PICC ends in a large vein in your chest. This large vein is called the superior vena cava (SVC). The PICC tip ends in the SVC because there is a lot of blood flow in the SVC. This allows medicines and IV fluids to be quickly distributed throughout the body. The PICC is inserted using a sterile technique by a specially trained nurse or physician. After the PICC is inserted, a chest X-ray exam is done to be sure it is in the correct place.  A PICC may be placed for different reasons, such as:  To give medicines and liquid nutrition that can only be given through a central line. Examples are:  Certain antibiotic treatments.  Chemotherapy.  Total parenteral nutrition (TPN).  To take frequent blood samples.  To give IV fluids and blood products.  If there is difficulty placing a peripheral intravenous (PIV) catheter. If taken care of properly, a PICC can remain in place for several months. A PICC can also allow a person to go home from the hospital early. Medicine and PICC care can be managed at home by a family member or home health care team. WHAT PROBLEMS CAN HAPPEN WHEN I HAVE A PICC? Problems with a PICC can occasionally occur. These may include:  A blood clot (thrombus) forming in or at the tip of the PICC. This can cause the PICC to become clogged. A clot-dissolving medicine called tissue plasminogen activator (tPA) can be given through the PICC to help break up the clot.  Inflammation of the vein (phlebitis) in which the PICC is placed. Signs of inflammation may include redness, pain at the insertion site, red streaks, or being able to feel a "cord" in the vein where the PICC is located.  Infection in the PICC or at the insertion site. Signs of  infection may include fever, chills, redness, swelling, or pus drainage from the PICC insertion site.  PICC movement (malposition). The PICC tip may move from its original position due to excessive physical activity, forceful coughing, sneezing, or vomiting.  A break or cut in the PICC. It is important to not use scissors near the PICC.  Nerve or tendon irritation or injury during PICC insertion. WHAT SHOULD I KEEP IN MIND ABOUT ACTIVITIES WHEN I HAVE A PICC?  You may bend your arm and move it freely. If your PICC is near or at the bend of your elbow, avoid activity with repeated motion at the elbow.  Rest at home for the remainder of the day following PICC line insertion.  Avoid lifting heavy objects as instructed by your health care provider.  Avoid using a crutch with the arm on the same side as your PICC. You may need to use a walker. WHAT SHOULD I KNOW ABOUT MY PICC DRESSING?  Keep your PICC bandage (dressing) clean and dry to prevent infection.  Ask your health care provider when you may shower. Ask your health care provider to teach you how to wrap the PICC when you do take a shower.  Change the PICC dressing as instructed by your health care provider.  Change your PICC dressing if it becomes loose or wet. WHAT SHOULD I KNOW ABOUT PICC CARE?  Check the PICC insertion site daily for   leakage, redness, swelling, or pain.  Do not take a bath, swim, or use hot tubs when you have a PICC. Cover PICC line with clear plastic wrap and tape to keep it dry while showering.  Flush the PICC as directed by your health care provider. Let your health care provider know right away if the PICC is difficult to flush or does not flush. Do not use force to flush the PICC.  Do not use a syringe that is less than 10 mL to flush the PICC.  Never pull or tug on the PICC.  Avoid blood pressure checks on the arm with the PICC.  Keep your PICC identification card with you at all times.  Do not  take the PICC out yourself. Only a trained clinical professional should remove the PICC. SEEK IMMEDIATE MEDICAL CARE IF:  Your PICC is accidently pulled all the way out. If this happens, cover the insertion site with a bandage or gauze dressing. Do not throw the PICC away. Your health care provider will need to inspect it.  Your PICC was tugged or pulled and has partially come out. Do not  push the PICC back in.  There is any type of drainage, redness, or swelling where the PICC enters the skin.  You cannot flush the PICC, it is difficult to flush, or the PICC leaks around the insertion site when it is flushed.  You hear a "flushing" sound when the PICC is flushed.  You have pain, discomfort, or numbness in your arm, shoulder, or jaw on the same side as the PICC.  You feel your heart "racing" or skipping beats.  You notice a hole or tear in the PICC.  You develop chills or a fever. MAKE SURE YOU:   Understand these instructions.  Will watch your condition.  Will get help right away if you are not doing well or get worse. Document Released: 08/31/2002 Document Revised: 12/15/2012 Document Reviewed: 11/01/2012 [employer] Patient Information 2014 [employer].  

## 2013-04-08 NOTE — Progress Notes (Signed)
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 Started on RT (Kinard) with goal of starting chemo 12/30 (mohammed) presented with worsening sob & hypoxic resp failure, CT scan showing progressive RML/RLL atelectasis & multiple left-sided  segmental and subsegmental PE      03/24/13 1st [facility] office visit/ Wert post disharge f/u  cc doe x 50 yards but sometimes sob at rest and difficulty lying flat, better right side down, assoc with cough most productive of sev tbsp slt bloody mucus esp in am but overall feels at least 25%  better  p started RT Mar 02 2013 still on 3 prednisone daily,  Better sob p  saba x sev hours but only using sev times a day   No obvious day to day or daytime variabilty or assoc  cp or chest tightness, subjective wheeze overt sinus or hb symptoms. No unusual exp hx or h/o childhood pna/ asthma or knowledge of premature birth.   Also denies any obvious fluctuation of symptoms with weather or environmental changes or other aggravating or alleviating factors except as outlined above    04/08/2013 Chief Complaint  Patient presents with  . 2 Week Follow Up    Breathing is slightly improved. Reports DOE, coughing with production of white mucus. Denies chest tightness or wheezing at this time.  Now is better. No oxygen. Still on XRT, 04/22/13.  Also on chemoRx: mohamed/kinard.  Notes some mucus.  No real blood.  No chest  pain.  No edema.  No cigs On full dose lovenox 150mg  /d and tol well  Current Medications, Allergies, Complete Past Medical History, Past Surgical History, Family History, and Social History were reviewed in [employer] record.  ROS  The following are not active complaints unless bolded sore throat, dysphagia since started RT, dental problems, itching, sneezing,  nasal congestion or excess/ purulent secretions, ear ache,   fever, chills, sweats, unintended wt loss, pleuritic or exertional cp, hemoptysis,  orthopnea pnd or leg swelling, presyncope, palpitations, heartburn, abdominal pain, anorexia, nausea, vomiting, diarrhea  or change in bowel or urinary habits, change in stools or urine, dysuria,hematuria,  rash, arthralgias, visual complaints, headache, numbness weakness or ataxia or problems with walking or coordination,  change in mood/affect or memory.             Review of Systems      Objective:   Physical Exam     amb bf nad with upper airway sounding "wheeze"  Wt Readings from Last 3 Encounters:  04/08/13 208 lb (94.348 kg)  04/06/13 211 lb (95.709 kg)  04/05/13 208 lb 4.8 oz (94.484 kg)     HEENT: nl dentition, turbinates, and orophanx. Nl external ear canals without cough reflex   NECK :  without JVD/Nodes/TM/ nl carotid upstrokes bilaterally   LUNGS: no acc muscle use, clear to A and P bilaterally without cough on insp or exp maneuvers   CV:  RRR  no s3 or murmur or increase in P2, no edema   ABD:  soft and nontender with nl excursion in the supine position. No bruits or organomegaly, bowel sounds nl  MS:  warm without deformities, calf tenderness, cyanosis or clubbing  SKIN: warm and dry without lesions    NEURO:  alert, approp, no deficits     CXR 03/14/13 The cardiac shadow is stable. The left lung remains clear. A  right-sided PICC line is again identified in satisfactory position.  Persistent elevation of the right  hemidiaphragm with consolidation  of the right lung base is noted. A small pleural effusion is noted  as well. The overall appearance is stable from the prior exam.  Assessment:        Pulmonary embolism Acute pulmonary embolism in L lung and R lung collapse d/t lung ca and endobronchial lesion Plan Cont lovenox/coumadin Did not tolerate xarelto  Pulmonary collapse R lung collapse d/t lung CA non small cell Plan Per rad and med onc  TOBACCO USE, QUIT Hx of tobacco use, now off   Non-small cell cancer of right lung 1/14: DIAGNOSIS: Stage IIIB (T2b, N3, M0) non-small cell lung cancer, invasive squamous cell carcinoma diagnosed in December of 2014.  Primary site: Lung (Right)  Staging method: AJCC 7th Edition  Clinical: Stage IIIB (T2b, N3, M0)  Summary: Stage IIIB (T2b, N3, M0)  Rx per Onc    Updated Medication List Outpatient Encounter Prescriptions as of 04/08/2013  Medication Sig  . albuterol (PROVENTIL HFA;VENTOLIN HFA) 108 (90 BASE) MCG/ACT inhaler Inhale 2 puffs into the lungs every 6 (six) hours as needed for wheezing or shortness of breath.  . budesonide-formoterol (SYMBICORT) 160-4.5 MCG/ACT inhaler Inhale 2 puffs into the lungs 2 (two) times daily.  Marland Kitchen buPROPion (WELLBUTRIN SR) 150 MG 12 hr tablet Take 150 mg by mouth daily.  . cholecalciferol (VITAMIN D) 1000 UNITS tablet Take 1 tablet (1,000 Units total) by mouth daily.  Marland Kitchen enoxaparin (LOVENOX) 150 MG/ML injection Inject 1 mL (150 mg total) into the skin daily.  . fluticasone (FLONASE) 50 MCG/ACT nasal spray Place 2 sprays into the nose daily.  . hyaluronate sodium (RADIAPLEXRX) GEL Apply 1 application topically 2 (two) times daily. Apply to chest area being treated with radiation after rad tx and bedtime when skin becomes itchy or irritation/erythema  And on weekedns as well  . LORazepam (ATIVAN) 1 MG tablet TAKE ONE TABLET BY MOUTH ONCE DAILY  . nebivolol (BYSTOLIC) 10 MG tablet Take 1 tablet (10 mg total) by mouth  daily.  Marland Kitchen omeprazole (PRILOSEC) 40 MG capsule Take 1 capsule (40 mg total) by mouth daily.  Marland Kitchen oxyCODONE (OXY IR/ROXICODONE) 5 MG immediate release tablet Take 1 tablet (5 mg total) by mouth every 6 (six) hours as needed for severe pain.  Marland Kitchen prochlorperazine (COMPAZINE) 10 MG tablet Take 1 tablet (10 mg total) by mouth every 6 (six) hours as needed for nausea or vomiting.  . tiotropium (SPIRIVA HANDIHALER) 18 MCG inhalation capsule Place 1 capsule (18 mcg total) into inhaler and inhale daily.  . triazolam (HALCION) 0.25 MG tablet Take 1 tablet (0.25 mg total) by mouth at bedtime as needed for sleep.  Marland Kitchen HYDROcodone-homatropine (HYCODAN) 5-1.5 MG/5ML syrup Take 5 mLs by mouth every 6 (six) hours as needed for cough.  . [DISCONTINUED] sucralfate (CARAFATE) 1 GM/10ML suspension Take 10 mLs (1 g total) by mouth 4 (four) times daily -  with meals and at bedtime.  . [DISCONTINUED] sodium chloride 0.9 %  injection 10 mL

## 2013-04-08 NOTE — Patient Instructions (Signed)
Stay on spiriva /symbicort Use hydromet cough syrup as needed Return 2 months

## 2013-04-09 NOTE — Assessment & Plan Note (Signed)
Acute pulmonary embolism in L lung and R lung collapse d/t lung ca and endobronchial lesion Plan Cont lovenox/coumadin Did not tolerate xarelto

## 2013-04-09 NOTE — Assessment & Plan Note (Signed)
R lung collapse d/t lung CA non small cell Plan Per rad and med onc

## 2013-04-09 NOTE — Assessment & Plan Note (Signed)
Hx of tobacco use, now off

## 2013-04-09 NOTE — Assessment & Plan Note (Signed)
1/14: DIAGNOSIS: Stage IIIB (T2b, N3, M0) non-small cell lung cancer, invasive squamous cell carcinoma diagnosed in December of 2014.  Primary site: Lung (Right)  Staging method: AJCC 7th Edition  Clinical: Stage IIIB (T2b, N3, M0)  Summary: Stage IIIB (T2b, N3, M0)  Rx per Onc

## 2013-04-11 ENCOUNTER — Ambulatory Visit
Admission: RE | Admit: 2013-04-11 | Discharge: 2013-04-11 | Disposition: A | Discharge status: Home / Self Care | Payer: BC Managed Care – PPO | Source: Ambulatory Visit | Attending: Radiation Oncology | Admitting: Radiation Oncology

## 2013-04-11 ENCOUNTER — Telehealth: Payer: Self-pay | Admitting: *Deleted

## 2013-04-11 ENCOUNTER — Other Ambulatory Visit (HOSPITAL_BASED_OUTPATIENT_CLINIC_OR_DEPARTMENT_OTHER): Payer: BC Managed Care – PPO

## 2013-04-11 ENCOUNTER — Ambulatory Visit (HOSPITAL_BASED_OUTPATIENT_CLINIC_OR_DEPARTMENT_OTHER): Payer: BC Managed Care – PPO

## 2013-04-11 VITALS — BP 111/59 | HR 100 | Temp 98.7°F | Resp 18

## 2013-04-11 DIAGNOSIS — C3491 Malignant neoplasm of unspecified part of right bronchus or lung: Secondary | ICD-10-CM

## 2013-04-11 DIAGNOSIS — C3400 Malignant neoplasm of unspecified main bronchus: Secondary | ICD-10-CM

## 2013-04-11 DIAGNOSIS — Z5111 Encounter for antineoplastic chemotherapy: Secondary | ICD-10-CM

## 2013-04-11 LAB — COMPREHENSIVE METABOLIC PANEL (CC13)
ALK PHOS: 66 U/L (ref 40–150)
ALT: 35 U/L (ref 0–55)
AST: 17 U/L (ref 5–34)
Albumin: 3 g/dL — ABNORMAL LOW (ref 3.5–5.0)
Anion Gap: 10 mEq/L (ref 3–11)
BILIRUBIN TOTAL: 0.36 mg/dL (ref 0.20–1.20)
BUN: 6.7 mg/dL — ABNORMAL LOW (ref 7.0–26.0)
CO2: 23 mEq/L (ref 22–29)
Calcium: 9.5 mg/dL (ref 8.4–10.4)
Chloride: 103 mEq/L (ref 98–109)
Creatinine: 0.8 mg/dL (ref 0.6–1.1)
GLUCOSE: 93 mg/dL (ref 70–140)
Potassium: 3.9 mEq/L (ref 3.5–5.1)
SODIUM: 136 meq/L (ref 136–145)
Total Protein: 6.3 g/dL — ABNORMAL LOW (ref 6.4–8.3)

## 2013-04-11 LAB — CBC WITH DIFFERENTIAL/PLATELET
BASO%: 0.9 % (ref 0.0–2.0)
Basophils Absolute: 0 10*3/uL (ref 0.0–0.1)
EOS%: 0.3 % (ref 0.0–7.0)
Eosinophils Absolute: 0 10*3/uL (ref 0.0–0.5)
HCT: 33.9 % — ABNORMAL LOW (ref 34.8–46.6)
HGB: 10.7 g/dL — ABNORMAL LOW (ref 11.6–15.9)
LYMPH%: 19.5 % (ref 14.0–49.7)
MCH: 28.1 pg (ref 25.1–34.0)
MCHC: 31.6 g/dL (ref 31.5–36.0)
MCV: 89 fL (ref 79.5–101.0)
MONO#: 0.3 10*3/uL (ref 0.1–0.9)
MONO%: 10.2 % (ref 0.0–14.0)
NEUT#: 2.3 10*3/uL (ref 1.5–6.5)
NEUT%: 69.1 % (ref 38.4–76.8)
PLATELETS: 173 10*3/uL (ref 145–400)
RBC: 3.81 10*6/uL (ref 3.70–5.45)
RDW: 18.4 % — ABNORMAL HIGH (ref 11.2–14.5)
WBC: 3.3 10*3/uL — ABNORMAL LOW (ref 3.9–10.3)
lymph#: 0.7 10*3/uL — ABNORMAL LOW (ref 0.9–3.3)

## 2013-04-11 MED ORDER — ONDANSETRON 16 MG/50ML IVPB (CHCC)
INTRAVENOUS | Status: AC
  Filled 2013-04-11: qty 16

## 2013-04-11 MED ORDER — FAMOTIDINE IN NACL 20-0.9 MG/50ML-% IV SOLN
20.0000 mg | Freq: Once | INTRAVENOUS | Status: AC
  Administered 2013-04-11: 20 mg via INTRAVENOUS

## 2013-04-11 MED ORDER — DEXAMETHASONE SODIUM PHOSPHATE 20 MG/5ML IJ SOLN
20.0000 mg | Freq: Once | INTRAMUSCULAR | Status: AC
  Administered 2013-04-11: 20 mg via INTRAVENOUS

## 2013-04-11 MED ORDER — DIPHENHYDRAMINE HCL 50 MG/ML IJ SOLN
INTRAMUSCULAR | Status: AC
  Filled 2013-04-11: qty 1

## 2013-04-11 MED ORDER — SODIUM CHLORIDE 0.9 % IV SOLN
276.0000 mg | Freq: Once | INTRAVENOUS | Status: AC
  Administered 2013-04-11: 280 mg via INTRAVENOUS
  Filled 2013-04-11: qty 28

## 2013-04-11 MED ORDER — SODIUM CHLORIDE 0.9 % IV SOLN
Freq: Once | INTRAVENOUS | Status: AC
  Administered 2013-04-11: 12:00:00 via INTRAVENOUS

## 2013-04-11 MED ORDER — SODIUM CHLORIDE 0.9 % IJ SOLN
10.0000 mL | INTRAMUSCULAR | Status: DC | PRN
  Administered 2013-04-11: 10 mL
  Filled 2013-04-11: qty 10

## 2013-04-11 MED ORDER — DIPHENHYDRAMINE HCL 50 MG/ML IJ SOLN
50.0000 mg | Freq: Once | INTRAMUSCULAR | Status: AC
  Administered 2013-04-11: 50 mg via INTRAVENOUS

## 2013-04-11 MED ORDER — DEXAMETHASONE SODIUM PHOSPHATE 20 MG/5ML IJ SOLN
INTRAMUSCULAR | Status: AC
  Filled 2013-04-11: qty 5

## 2013-04-11 MED ORDER — FAMOTIDINE IN NACL 20-0.9 MG/50ML-% IV SOLN
INTRAVENOUS | Status: AC
  Filled 2013-04-11: qty 50

## 2013-04-11 MED ORDER — SODIUM CHLORIDE 0.9 % IV SOLN
45.0000 mg/m2 | Freq: Once | INTRAVENOUS | Status: AC
  Administered 2013-04-11: 96 mg via INTRAVENOUS
  Filled 2013-04-11: qty 16

## 2013-04-11 MED ORDER — HEPARIN SOD (PORK) LOCK FLUSH 100 UNIT/ML IV SOLN
250.0000 [IU] | Freq: Once | INTRAVENOUS | Status: AC | PRN
  Administered 2013-04-11: 15:00:00
  Filled 2013-04-11: qty 5

## 2013-04-11 MED ORDER — ONDANSETRON 16 MG/50ML IVPB (CHCC)
16.0000 mg | Freq: Once | INTRAVENOUS | Status: AC
  Administered 2013-04-11: 16 mg via INTRAVENOUS

## 2013-04-11 NOTE — Telephone Encounter (Signed)
Pt advocate co-pay relief program form given to [facility] to review.  SLJ

## 2013-04-11 NOTE — Patient Instructions (Signed)
[facility] Discharge Instructions for Patients Receiving Chemotherapy  Today you received the following chemotherapy agents: Taxol/ Carboplatin  To help prevent nausea and vomiting after your treatment, we encourage you to take your nausea medication as needed.   If you develop nausea and vomiting that is not controlled by your nausea medication, call the clinic.   BELOW ARE SYMPTOMS THAT SHOULD BE REPORTED IMMEDIATELY:  *FEVER GREATER THAN 100.5 F  *CHILLS WITH OR WITHOUT FEVER  NAUSEA AND VOMITING THAT IS NOT CONTROLLED WITH YOUR NAUSEA MEDICATION  *UNUSUAL SHORTNESS OF BREATH  *UNUSUAL BRUISING OR BLEEDING  TENDERNESS IN MOUTH AND THROAT WITH OR WITHOUT PRESENCE OF ULCERS  *URINARY PROBLEMS  *BOWEL PROBLEMS  UNUSUAL RASH Items with * indicate a potential emergency and should be followed up as soon as possible.  Feel free to call the clinic you have any questions or concerns. The clinic phone number is [phone].    

## 2013-04-12 ENCOUNTER — Ambulatory Visit
Admission: RE | Admit: 2013-04-12 | Discharge: 2013-04-12 | Disposition: A | Discharge status: Home / Self Care | Payer: BC Managed Care – PPO | Source: Ambulatory Visit | Attending: Radiation Oncology | Admitting: Radiation Oncology

## 2013-04-12 ENCOUNTER — Ambulatory Visit: Payer: BC Managed Care – PPO | Attending: Internal Medicine | Admitting: Physical Therapy

## 2013-04-12 VITALS — BP 131/78 | HR 100 | Temp 97.5°F | Resp 20 | Ht 63.0 in | Wt 207.5 lb

## 2013-04-12 DIAGNOSIS — IMO0001 Reserved for inherently not codable concepts without codable children: Secondary | ICD-10-CM | POA: Insufficient documentation

## 2013-04-12 DIAGNOSIS — R29898 Other symptoms and signs involving the musculoskeletal system: Secondary | ICD-10-CM | POA: Insufficient documentation

## 2013-04-12 DIAGNOSIS — R5381 Other malaise: Secondary | ICD-10-CM | POA: Insufficient documentation

## 2013-04-12 DIAGNOSIS — C3490 Malignant neoplasm of unspecified part of unspecified bronchus or lung: Secondary | ICD-10-CM | POA: Insufficient documentation

## 2013-04-12 DIAGNOSIS — C3491 Malignant neoplasm of unspecified part of right bronchus or lung: Secondary | ICD-10-CM

## 2013-04-12 MED ORDER — HYDROCORTISONE ACETATE 25 MG RE SUPP: 25.0000 mg | Freq: Two times a day (BID) | RECTAL | Status: DC

## 2013-04-12 NOTE — Progress Notes (Signed)
Meagan Garcia has had 27 fractions to her right lung.  She denies pain.  She does have a cough that she says is about the same.  She occasionally coughs up dark sputum.  She is currently on lovenox 100 mg daily.  She denies shortness of breath.  She denies a sore throat.  She does reports a "raspy" voice.  She reports having to force herself to eat.  She has lost 1 lb since last week.  Her last chemotherapy infusion was yesterday.  She reports having problems with constipation and painful bowel movements due to her hemorrhoids.  Suggested over the counter stool softeners which she says do not help.  The skin on her right upper chest and right upper back has slight hyperpigmentation.  She is going to start using radiaplex gel.

## 2013-04-12 NOTE — Progress Notes (Signed)
  Radiation Oncology         [phone] ________________________________  Name: Meagan Garcia MRN: [id number]  Date: 04/12/2013  DOB: [birth year]  Weekly Radiation Therapy Management  Non-small cell cancer of right lung   Primary site: Lung (Right)   Staging method: AJCC 7th Edition   Clinical: Stage IIIB (T2b, N3, M0)   Summary: Stage IIIB (T2b, N3, M0)  Current Dose: 48.6 Gy     Planned Dose:  63 Gy  Narrative . . . . . . . . The patient presents for routine under treatment assessment.                                   The patient is without complaint except for continued problems with hemorrhoidal discomfort. Patient's tried over-the-counter medications without success. She's also used sitz baths.  I have given her a prescription for Anusol-HC suppositories to use.                                 Set-up films were reviewed.                                 The chart was checked. Physical Findings. . .  height is 5\' 3"  (1.6 m) and weight is 207 lb 8 oz (94.121 kg). Her temperature is 97.5 F (36.4 C). Her blood pressure is 131/78 and her pulse is 100. Her respiration is 20 and oxygen saturation is 100%. . Weight essentially stable.  No significant changes. Impression . . . . . . . The patient is tolerating radiation. Plan . . . . . . . . . . . . Continue treatment as planned.  ________________________________  -----------------------------------  Blair Promise, PhD, MD

## 2013-04-13 ENCOUNTER — Ambulatory Visit (HOSPITAL_BASED_OUTPATIENT_CLINIC_OR_DEPARTMENT_OTHER): Payer: BC Managed Care – PPO

## 2013-04-13 ENCOUNTER — Ambulatory Visit
Admission: RE | Admit: 2013-04-13 | Discharge: 2013-04-13 | Disposition: A | Discharge status: Home / Self Care | Payer: BC Managed Care – PPO | Source: Ambulatory Visit | Attending: Radiation Oncology | Admitting: Radiation Oncology

## 2013-04-13 VITALS — BP 122/66 | HR 100 | Temp 98.0°F

## 2013-04-13 DIAGNOSIS — C3400 Malignant neoplasm of unspecified main bronchus: Secondary | ICD-10-CM

## 2013-04-13 DIAGNOSIS — Z452 Encounter for adjustment and management of vascular access device: Secondary | ICD-10-CM

## 2013-04-13 MED ORDER — HEPARIN SOD (PORK) LOCK FLUSH 100 UNIT/ML IV SOLN
500.0000 [IU] | Freq: Once | INTRAVENOUS | Status: AC
Start: 2013-04-13 — End: 2013-04-13
  Administered 2013-04-13: 250 [IU] via INTRAVENOUS
  Filled 2013-04-13: qty 5

## 2013-04-13 MED ORDER — SODIUM CHLORIDE 0.9 % IJ SOLN
10.0000 mL | INTRAMUSCULAR | Status: DC | PRN
  Administered 2013-04-13: 10 mL via INTRAVENOUS
  Filled 2013-04-13: qty 10

## 2013-04-13 NOTE — Patient Instructions (Signed)
PICC Home Guide A peripherally inserted central catheter (PICC) is a long, thin, flexible tube that is inserted into a vein in the upper arm. It is a form of intravenous (IV) access. It is considered to be a "central" line because the tip of the PICC ends in a large vein in your chest. This large vein is called the superior vena cava (SVC). The PICC tip ends in the SVC because there is a lot of blood flow in the SVC. This allows medicines and IV fluids to be quickly distributed throughout the body. The PICC is inserted using a sterile technique by a specially trained nurse or physician. After the PICC is inserted, a chest X-ray exam is done to be sure it is in the correct place.  A PICC may be placed for different reasons, such as:  To give medicines and liquid nutrition that can only be given through a central line. Examples are:  Certain antibiotic treatments.  Chemotherapy.  Total parenteral nutrition (TPN).  To take frequent blood samples.  To give IV fluids and blood products.  If there is difficulty placing a peripheral intravenous (PIV) catheter. If taken care of properly, a PICC can remain in place for several months. A PICC can also allow a person to go home from the hospital early. Medicine and PICC care can be managed at home by a family member or home health care team. WHAT PROBLEMS CAN HAPPEN WHEN I HAVE A PICC? Problems with a PICC can occasionally occur. These may include:  A blood clot (thrombus) forming in or at the tip of the PICC. This can cause the PICC to become clogged. A clot-dissolving medicine called tissue plasminogen activator (tPA) can be given through the PICC to help break up the clot.  Inflammation of the vein (phlebitis) in which the PICC is placed. Signs of inflammation may include redness, pain at the insertion site, red streaks, or being able to feel a "cord" in the vein where the PICC is located.  Infection in the PICC or at the insertion site. Signs of  infection may include fever, chills, redness, swelling, or pus drainage from the PICC insertion site.  PICC movement (malposition). The PICC tip may move from its original position due to excessive physical activity, forceful coughing, sneezing, or vomiting.  A break or cut in the PICC. It is important to not use scissors near the PICC.  Nerve or tendon irritation or injury during PICC insertion. WHAT SHOULD I KEEP IN MIND ABOUT ACTIVITIES WHEN I HAVE A PICC?  You may bend your arm and move it freely. If your PICC is near or at the bend of your elbow, avoid activity with repeated motion at the elbow.  Rest at home for the remainder of the day following PICC line insertion.  Avoid lifting heavy objects as instructed by your health care provider.  Avoid using a crutch with the arm on the same side as your PICC. You may need to use a walker. WHAT SHOULD I KNOW ABOUT MY PICC DRESSING?  Keep your PICC bandage (dressing) clean and dry to prevent infection.  Ask your health care provider when you may shower. Ask your health care provider to teach you how to wrap the PICC when you do take a shower.  Change the PICC dressing as instructed by your health care provider.  Change your PICC dressing if it becomes loose or wet. WHAT SHOULD I KNOW ABOUT PICC CARE?  Check the PICC insertion site daily for   leakage, redness, swelling, or pain.  Do not take a bath, swim, or use hot tubs when you have a PICC. Cover PICC line with clear plastic wrap and tape to keep it dry while showering.  Flush the PICC as directed by your health care provider. Let your health care provider know right away if the PICC is difficult to flush or does not flush. Do not use force to flush the PICC.  Do not use a syringe that is less than 10 mL to flush the PICC.  Never pull or tug on the PICC.  Avoid blood pressure checks on the arm with the PICC.  Keep your PICC identification card with you at all times.  Do not  take the PICC out yourself. Only a trained clinical professional should remove the PICC. SEEK IMMEDIATE MEDICAL CARE IF:  Your PICC is accidently pulled all the way out. If this happens, cover the insertion site with a bandage or gauze dressing. Do not throw the PICC away. Your health care provider will need to inspect it.  Your PICC was tugged or pulled and has partially come out. Do not  push the PICC back in.  There is any type of drainage, redness, or swelling where the PICC enters the skin.  You cannot flush the PICC, it is difficult to flush, or the PICC leaks around the insertion site when it is flushed.  You hear a "flushing" sound when the PICC is flushed.  You have pain, discomfort, or numbness in your arm, shoulder, or jaw on the same side as the PICC.  You feel your heart "racing" or skipping beats.  You notice a hole or tear in the PICC.  You develop chills or a fever. MAKE SURE YOU:   Understand these instructions.  Will watch your condition.  Will get help right away if you are not doing well or get worse. Document Released: 08/31/2002 Document Revised: 12/15/2012 Document Reviewed: 11/01/2012 [employer] Patient Information 2014 [employer].  

## 2013-04-14 ENCOUNTER — Ambulatory Visit
Admission: RE | Admit: 2013-04-14 | Discharge: 2013-04-14 | Disposition: A | Discharge status: Home / Self Care | Payer: BC Managed Care – PPO | Source: Ambulatory Visit | Attending: Radiation Oncology | Admitting: Radiation Oncology

## 2013-04-15 ENCOUNTER — Ambulatory Visit
Admission: RE | Admit: 2013-04-15 | Discharge: 2013-04-15 | Disposition: A | Discharge status: Home / Self Care | Payer: BC Managed Care – PPO | Source: Ambulatory Visit | Attending: Radiation Oncology | Admitting: Radiation Oncology

## 2013-04-15 ENCOUNTER — Ambulatory Visit (HOSPITAL_BASED_OUTPATIENT_CLINIC_OR_DEPARTMENT_OTHER): Payer: BC Managed Care – PPO

## 2013-04-15 VITALS — BP 114/67 | HR 99 | Temp 97.8°F

## 2013-04-15 DIAGNOSIS — Z452 Encounter for adjustment and management of vascular access device: Secondary | ICD-10-CM

## 2013-04-15 DIAGNOSIS — C3400 Malignant neoplasm of unspecified main bronchus: Secondary | ICD-10-CM

## 2013-04-15 MED ORDER — HEPARIN SOD (PORK) LOCK FLUSH 100 UNIT/ML IV SOLN
500.0000 [IU] | Freq: Once | INTRAVENOUS | Status: AC
  Administered 2013-04-15: 250 [IU] via INTRAVENOUS
  Filled 2013-04-15: qty 5

## 2013-04-15 MED ORDER — SODIUM CHLORIDE 0.9 % IJ SOLN
10.0000 mL | INTRAMUSCULAR | Status: DC | PRN
  Administered 2013-04-15: 10 mL via INTRAVENOUS
  Filled 2013-04-15: qty 10

## 2013-04-18 ENCOUNTER — Ambulatory Visit (HOSPITAL_BASED_OUTPATIENT_CLINIC_OR_DEPARTMENT_OTHER): Payer: BC Managed Care – PPO | Admitting: Physician Assistant

## 2013-04-18 ENCOUNTER — Encounter: Payer: Self-pay | Admitting: Physician Assistant

## 2013-04-18 ENCOUNTER — Telehealth: Payer: Self-pay | Admitting: Internal Medicine

## 2013-04-18 ENCOUNTER — Other Ambulatory Visit (HOSPITAL_BASED_OUTPATIENT_CLINIC_OR_DEPARTMENT_OTHER): Payer: BC Managed Care – PPO

## 2013-04-18 ENCOUNTER — Ambulatory Visit
Admission: RE | Admit: 2013-04-18 | Discharge: 2013-04-18 | Disposition: A | Discharge status: Home / Self Care | Payer: BC Managed Care – PPO | Source: Ambulatory Visit | Attending: Radiation Oncology | Admitting: Radiation Oncology

## 2013-04-18 ENCOUNTER — Ambulatory Visit (HOSPITAL_BASED_OUTPATIENT_CLINIC_OR_DEPARTMENT_OTHER): Payer: BC Managed Care – PPO

## 2013-04-18 VITALS — BP 124/76 | HR 100 | Temp 97.5°F | Resp 19 | Ht 63.0 in | Wt 203.2 lb

## 2013-04-18 DIAGNOSIS — C3400 Malignant neoplasm of unspecified main bronchus: Secondary | ICD-10-CM

## 2013-04-18 DIAGNOSIS — J3489 Other specified disorders of nose and nasal sinuses: Secondary | ICD-10-CM

## 2013-04-18 DIAGNOSIS — Z5111 Encounter for antineoplastic chemotherapy: Secondary | ICD-10-CM

## 2013-04-18 DIAGNOSIS — C3491 Malignant neoplasm of unspecified part of right bronchus or lung: Secondary | ICD-10-CM

## 2013-04-18 DIAGNOSIS — R059 Cough, unspecified: Secondary | ICD-10-CM

## 2013-04-18 DIAGNOSIS — R05 Cough: Secondary | ICD-10-CM

## 2013-04-18 LAB — CBC WITH DIFFERENTIAL/PLATELET
BASO%: 0.4 % (ref 0.0–2.0)
Basophils Absolute: 0 10*3/uL (ref 0.0–0.1)
EOS%: 0 % (ref 0.0–7.0)
Eosinophils Absolute: 0 10*3/uL (ref 0.0–0.5)
HCT: 31 % — ABNORMAL LOW (ref 34.8–46.6)
HGB: 9.8 g/dL — ABNORMAL LOW (ref 11.6–15.9)
LYMPH%: 17.8 % (ref 14.0–49.7)
MCH: 28.3 pg (ref 25.1–34.0)
MCHC: 31.6 g/dL (ref 31.5–36.0)
MCV: 89.6 fL (ref 79.5–101.0)
MONO#: 0.2 10*3/uL (ref 0.1–0.9)
MONO%: 8.5 % (ref 0.0–14.0)
NEUT#: 1.8 10*3/uL (ref 1.5–6.5)
NEUT%: 73.3 % (ref 38.4–76.8)
NRBC: 0 % (ref 0–0)
PLATELETS: 148 10*3/uL (ref 145–400)
RBC: 3.46 10*6/uL — AB (ref 3.70–5.45)
RDW: 19.2 % — ABNORMAL HIGH (ref 11.2–14.5)
WBC: 2.5 10*3/uL — ABNORMAL LOW (ref 3.9–10.3)
lymph#: 0.4 10*3/uL — ABNORMAL LOW (ref 0.9–3.3)

## 2013-04-18 LAB — COMPREHENSIVE METABOLIC PANEL (CC13)
ALT: 25 U/L (ref 0–55)
AST: 15 U/L (ref 5–34)
Albumin: 2.9 g/dL — ABNORMAL LOW (ref 3.5–5.0)
Alkaline Phosphatase: 69 U/L (ref 40–150)
Anion Gap: 9 mEq/L (ref 3–11)
BUN: 5.6 mg/dL — ABNORMAL LOW (ref 7.0–26.0)
CO2: 22 mEq/L (ref 22–29)
Calcium: 9 mg/dL (ref 8.4–10.4)
Chloride: 107 mEq/L (ref 98–109)
Creatinine: 0.8 mg/dL (ref 0.6–1.1)
Glucose: 141 mg/dl — ABNORMAL HIGH (ref 70–140)
Potassium: 3.6 mEq/L (ref 3.5–5.1)
SODIUM: 138 meq/L (ref 136–145)
TOTAL PROTEIN: 6.1 g/dL — AB (ref 6.4–8.3)
Total Bilirubin: 0.5 mg/dL (ref 0.20–1.20)

## 2013-04-18 MED ORDER — SODIUM CHLORIDE 0.9 % IV SOLN
276.0000 mg | Freq: Once | INTRAVENOUS | Status: AC
  Administered 2013-04-18: 280 mg via INTRAVENOUS
  Filled 2013-04-18: qty 28

## 2013-04-18 MED ORDER — DIPHENHYDRAMINE HCL 50 MG/ML IJ SOLN
INTRAMUSCULAR | Status: AC
  Filled 2013-04-18: qty 1

## 2013-04-18 MED ORDER — FAMOTIDINE IN NACL 20-0.9 MG/50ML-% IV SOLN
20.0000 mg | Freq: Once | INTRAVENOUS | Status: AC
  Administered 2013-04-18: 20 mg via INTRAVENOUS

## 2013-04-18 MED ORDER — DIPHENHYDRAMINE HCL 50 MG/ML IJ SOLN
50.0000 mg | Freq: Once | INTRAMUSCULAR | Status: AC
  Administered 2013-04-18: 50 mg via INTRAVENOUS

## 2013-04-18 MED ORDER — ONDANSETRON 16 MG/50ML IVPB (CHCC)
INTRAVENOUS | Status: AC
  Filled 2013-04-18: qty 16

## 2013-04-18 MED ORDER — SODIUM CHLORIDE 0.9 % IV SOLN
Freq: Once | INTRAVENOUS | Status: AC
  Administered 2013-04-18: 12:00:00 via INTRAVENOUS

## 2013-04-18 MED ORDER — HEPARIN SOD (PORK) LOCK FLUSH 100 UNIT/ML IV SOLN
500.0000 [IU] | Freq: Once | INTRAVENOUS | Status: DC | PRN
  Filled 2013-04-18: qty 5

## 2013-04-18 MED ORDER — SODIUM CHLORIDE 0.9 % IJ SOLN
10.0000 mL | INTRAMUSCULAR | Status: DC | PRN
  Administered 2013-04-18: 10 mL
  Filled 2013-04-18: qty 10

## 2013-04-18 MED ORDER — DEXAMETHASONE SODIUM PHOSPHATE 20 MG/5ML IJ SOLN
INTRAMUSCULAR | Status: AC
  Filled 2013-04-18: qty 5

## 2013-04-18 MED ORDER — HEPARIN SOD (PORK) LOCK FLUSH 100 UNIT/ML IV SOLN
250.0000 [IU] | Freq: Once | INTRAVENOUS | Status: AC | PRN
  Administered 2013-04-18: 250 [IU]
  Filled 2013-04-18: qty 5

## 2013-04-18 MED ORDER — DEXAMETHASONE SODIUM PHOSPHATE 20 MG/5ML IJ SOLN
20.0000 mg | Freq: Once | INTRAMUSCULAR | Status: AC
  Administered 2013-04-18: 20 mg via INTRAVENOUS

## 2013-04-18 MED ORDER — ONDANSETRON 16 MG/50ML IVPB (CHCC)
16.0000 mg | Freq: Once | INTRAVENOUS | Status: AC
  Administered 2013-04-18: 16 mg via INTRAVENOUS

## 2013-04-18 MED ORDER — SODIUM CHLORIDE 0.9 % IV SOLN
45.0000 mg/m2 | Freq: Once | INTRAVENOUS | Status: AC
  Administered 2013-04-18: 96 mg via INTRAVENOUS
  Filled 2013-04-18: qty 16

## 2013-04-18 MED ORDER — FAMOTIDINE IN NACL 20-0.9 MG/50ML-% IV SOLN
INTRAVENOUS | Status: AC
  Filled 2013-04-18: qty 50

## 2013-04-18 NOTE — Progress Notes (Addendum)
[place] Telephone:([phone]   Fax:([phone]) [place] NOTE  [name], MD [address] [facility] [place] [address]  DIAGNOSIS: Stage IIIB (T2b, N3, M0) non-small cell lung cancer, invasive squamous cell carcinoma diagnosed in December of 2014. Primary site: Lung (Right)  Staging method: AJCC 7th Edition  Clinical: Stage IIIB (T2b, N3, M0)  Summary: Stage IIIB (T2b, N3, M0)   PRIOR THERAPY: None   CURRENT THERAPY: Concurrent chemoradiation with weekly carboplatin for an AUC of 2 and paclitaxel at 45 mg per meter square for total of 6-7 weeks depending on the final dose of radiation. Meagan Garcia status post 5 cycles.  DISEASE STAGE:  Non-small cell cancer of right lung  Primary site: Lung (Right)  Staging method: AJCC 7th Edition  Clinical: Stage IIIB (T2b, N3, M0)  Summary: Stage IIIB (T2b, N3, M0)  CHEMOTHERAPY INTENT: control/palliative  CURRENT # OF CHEMOTHERAPY CYCLES: 6 CURRENT ANTIEMETICS: Dexamethasone, Zofran, Compazine  CURRENT SMOKING STATUS: Former smoker, quit 02/19/1996  ORAL CHEMOTHERAPY AND CONSENT: n/a  CURRENT BISPHOSPHONATES USE: None  PAIN MANAGEMENT: oxycodone  NARCOTICS INDUCED CONSTIPATION: nine  LIVING WILL AND CODE STATUS: ?   INTERVAL HISTORY: Meagan Garcia 63 y.o. female returns to the clinic today for followup visit accompanied by her brother. The patient is tolerating her current treatment with concurrent chemoradiation fairly well with no significant adverse effects, except for decreased appetite resulting in decreased by mouth intake. Meagan Garcia also reports a cough that is productive primarily of thick white secretions although occasionally the the secretions or yellow or brown. His cough is not associated with any fever or chills. Meagan Garcia has had some sinus drainage over the last several days. On Saturday Meagan Garcia noticed a small lump on the back of the left side of her neck. This area is tender.  Meagan Garcia also some issues with constipation and is currently taking a stool softener 2 tablets every other day. Meagan Garcia has a history of hemorrhoids that are currently irritated but Meagan Garcia denied any blood in her stool. Meagan Garcia denied having any fever or chills. Meagan Garcia has  nausea  but no vomiting. The patient denied having any significant weight loss or night sweats. Meagan Garcia has no chest pain but has shortness breath with exertion. Meagan Garcia denied hemoptysis. The patient is here today to start cycle #6 of her concurrent chemoradiation. Her final fraction of radiation therapy scheduled for 04/22/2013.  MEDICAL HISTORY: Past Medical History  Diagnosis Date  . Anxiety   . Depression   . Peripheral neuropathy     feet: unknown etiol  . Elevated glucose   . Allergic rhinitis   . GERD (gastroesophageal reflux disease)   . Complication of anesthesia w/tubal    got anest. "went crazy"   . Non-small cell lung cancer     ALLERGIES:  has No Known Allergies.  MEDICATIONS:  Current Outpatient Prescriptions  Medication Sig Dispense Refill  . albuterol (PROVENTIL HFA;VENTOLIN HFA) 108 (90 BASE) MCG/ACT inhaler Inhale 2 puffs into the lungs every 6 (six) hours as needed for wheezing or shortness of breath.  1 Inhaler  2  . budesonide-formoterol (SYMBICORT) 160-4.5 MCG/ACT inhaler Inhale 2 puffs into the lungs 2 (two) times daily.  1 Inhaler  11  . buPROPion (WELLBUTRIN SR) 150 MG 12 hr tablet Take 150 mg by mouth daily.      . cholecalciferol (VITAMIN D) 1000 UNITS tablet Take 1 tablet (1,000 Units total) by mouth  daily.  30 tablet  0  . enoxaparin (LOVENOX) 150 MG/ML injection Inject 1 mL (150 mg total) into the skin daily.  30 Syringe  1  . fluticasone (FLONASE) 50 MCG/ACT nasal spray Place 2 sprays into the nose daily.  48 g  3  . hyaluronate sodium (RADIAPLEXRX) GEL Apply 1 application topically 2 (two) times daily. Apply to chest area being treated with radiation after rad tx and bedtime when skin becomes itchy or  irritation/erythema  And on weekedns as well      . hydrocortisone (ANUSOL-HC) 25 MG suppository Place 1 suppository (25 mg total) rectally 2 (two) times daily.  12 suppository  0  . LORazepam (ATIVAN) 1 MG tablet TAKE ONE TABLET BY MOUTH ONCE DAILY      . nebivolol (BYSTOLIC) 10 MG tablet Take 1 tablet (10 mg total) by mouth daily.  30 tablet  11  . omeprazole (PRILOSEC) 40 MG capsule Take 1 capsule (40 mg total) by mouth daily.  30 capsule  5  . prochlorperazine (COMPAZINE) 10 MG tablet Take 1 tablet (10 mg total) by mouth every 6 (six) hours as needed for nausea or vomiting.  60 tablet  0  . tiotropium (SPIRIVA HANDIHALER) 18 MCG inhalation capsule Place 1 capsule (18 mcg total) into inhaler and inhale daily.  30 capsule  11  . triazolam (HALCION) 0.25 MG tablet Take 1 tablet (0.25 mg total) by mouth at bedtime as needed for sleep.  30 tablet  5  . HYDROcodone-homatropine (HYCODAN) 5-1.5 MG/5ML syrup Take 5 mLs by mouth every 6 (six) hours as needed for cough.  240 mL  0  . oxyCODONE (OXY IR/ROXICODONE) 5 MG immediate release tablet Take 1 tablet (5 mg total) by mouth every 6 (six) hours as needed for severe pain.  30 tablet  0   No current facility-administered medications for this visit.   Facility-Administered Medications Ordered in Other Visits  Medication Dose Route Frequency Provider Last Rate Last Dose  . heparin lock flush 100 unit/mL  500 Units Intracatheter Once PRN Curt Bears, MD      . sodium chloride 0.9 % injection 10 mL  10 mL Intracatheter PRN Curt Bears, MD   10 mL at 04/18/13 1406    SURGICAL HISTORY:  Past Surgical History  Procedure Laterality Date  . Abdominal hysterectomy      partial  . Tubal reversal    . Tubal ligation      x2  . Fibroid tumors    . Knee arthroscopy    . Foot surgery      cysts from both feet  . Video bronchoscopy Bilateral 02/21/2013    Procedure: VIDEO BRONCHOSCOPY WITHOUT FLUORO;  Surgeon: Doree Fudge, MD;   Location: [facility];  Service: Cardiopulmonary;  Laterality: Bilateral;    REVIEW OF SYSTEMS:  A comprehensive review of systems was negative except for: Respiratory: positive for cough and dyspnea on exertion Gastrointestinal: positive for constipation and nausea   PHYSICAL EXAMINATION: General appearance: alert, cooperative and no distress Head: Normocephalic, without obvious abnormality, atraumatic Neck: no JVD, supple, symmetrical, trachea midline and thyroid not enlarged, symmetric, no tenderness/mass/nodules Lymph nodes: Cervical adenopathy: approximately 1 cm tender left posterior cervical chain lymphnode Resp: clear to auscultation bilaterally Back: symmetric, no curvature. ROM normal. No CVA tenderness. Cardio: regular rate and rhythm, S1, S2 normal, no murmur, click, rub or gallop GI: soft, non-tender; bowel sounds normal; no masses,  no organomegaly Extremities: extremities normal, atraumatic, no cyanosis or edema  ECOG PERFORMANCE STATUS: 1 - Symptomatic but completely ambulatory  Blood pressure 124/76, pulse 100, temperature 97.5 F (36.4 C), temperature source Oral, resp. rate 19, height 5\' 3"  (1.6 m), weight 203 lb 3.2 oz (92.171 kg).  LABORATORY DATA: Lab Results  Component Value Date   WBC 2.5* 04/18/2013   HGB 9.8* 04/18/2013   HCT 31.0* 04/18/2013   MCV 89.6 04/18/2013   PLT 148 04/18/2013      Chemistry      Component Value Date/Time   NA 136 04/11/2013 1140   NA 135* 03/16/2013 0510   K 3.9 04/11/2013 1140   K 4.2 03/16/2013 0510   CL 99 03/16/2013 0510   CO2 23 04/11/2013 1140   CO2 24 03/16/2013 0510   BUN 6.7* 04/11/2013 1140   BUN 19 03/16/2013 0510   CREATININE 0.8 04/11/2013 1140   CREATININE 0.77 03/16/2013 0510      Component Value Date/Time   CALCIUM 9.5 04/11/2013 1140   CALCIUM 9.5 03/16/2013 0510   ALKPHOS 66 04/11/2013 1140   ALKPHOS 150* 03/07/2013 1210   AST 17 04/11/2013 1140   AST 45* 03/07/2013 1210   ALT 35 04/11/2013 1140   ALT 89* 03/07/2013 1210   BILITOT  0.36 04/11/2013 1140   BILITOT 0.8 03/07/2013 1210       RADIOGRAPHIC STUDIES: Dg Chest 2 View  03/14/2013   CLINICAL DATA:  History of lung cancer, followup atelectasis  EXAM: CHEST  2 VIEW  COMPARISON:  03/09/2013  FINDINGS: The cardiac shadow is stable. The left lung remains clear. A right-sided PICC line is again identified in satisfactory position. Persistent elevation of the right hemidiaphragm with consolidation of the right lung base is noted. A small pleural effusion is noted as well. The overall appearance is stable from the prior exam.  IMPRESSION: No significant change from the prior study.   Electronically Signed   By: Inez Catalina M.D.   On: 03/14/2013 08:04   Dg Chest Port 1 View  03/09/2013   CLINICAL DATA:  Atelectasis.  EXAM: PORTABLE CHEST - 1 VIEW  COMPARISON:  03/08/2013.  FINDINGS: Persistent atelectasis of the right lung is noted. Underlying pleural effusion cannot be excluded. There is shift of the mediastinum and heart from left to right. Left lung is clear. Cardiac silhouette cannot be evaluated. Pulmonary vascularity is normal. No acute osseous abnormality.  IMPRESSION: Persistent atelectasis right lung. Underlying pleural effusion cannot be excluded.   Electronically Signed   By: Marcello Moores  Register   On: 03/09/2013 06:49   Dg Chest Port 1 View  03/08/2013   CLINICAL DATA:  Atelectasis  EXAM: PORTABLE CHEST - 1 VIEW  COMPARISON:  Prior chest x-ray and CT PE study 03/07/2013  FINDINGS: Interval development of whiteout of the right hemi thorax with left-to-right shift of the cardiac and mediastinal structures. There is abrupt cut off of the right main stem bronchus. The left lung remains well it simply well aerated although there is diffuse interstitial prominence which remains unchanged compared to the recent prior imaging. No acute osseous abnormality. No pneumothorax.  IMPRESSION: Interval obstruction of the right main stem bronchus resulting in complete atelectasis of the  right lung. Resultant volume loss with left-to-right shift of the cardiac and mediastinal structures.  Given the rapid interval change, mucous plugging in the narrowed right main stem bronchus is the most likely etiology.  These results were called by telephone at the time of interpretation on 03/08/2013 at 8:15 AM to Dr. Joya Gaskins, who verbally  acknowledged these results.   Electronically Signed   By: Jacqulynn Cadet M.D.   On: 03/08/2013 08:15   Dg Chest Port 1 View  03/07/2013   CLINICAL DATA:  Shortness of breath and difficulty breathing  EXAM: PORTABLE CHEST - 1 VIEW  COMPARISON:  02/18/2013  FINDINGS: There is again noted fullness in the hilum and right suprahilar at/ peritracheal region consistent with the given history of centrally obstructing mass lesion. Increasing consolidation is noted in the right lower lobe. The left lung is clear.  IMPRESSION: Persistent changes consistent with the given clinical history. There is increasing right basilar consolidation identified likely related to post obstructive changes.   Electronically Signed   By: Inez Catalina M.D.   On: 03/07/2013 12:41    ASSESSMENT AND PLAN: This is a very pleasant 63 years old [place] American female with a stage IIIB non-small cell lung cancer currently undergoing a course of concurrent chemoradiation with weekly carboplatin and paclitaxel status post 5 cycles. The patient is feeling fine with no specific complaints except for shortness of breath with exertion and decreased appetite. The patient was discussed with and also seen by Dr. Julien Nordmann. Meagan Garcia will complete her course of concurrent chemoradiation as scheduled. Meagan Garcia did not have a PET scan or CT abdomen and pelvis done as yet. We'll have her return in one month with full CT scans to include the neck, chest, abdomen and pelvis with contrast to reevaluate her disease. As Meagan Garcia is completing her course of concurrent chemoradiation, I expect her appetite will gradually improve and Meagan Garcia'll  return closer to her baseline by the time Meagan Garcia returns in one month with her restaging CT scans to reevaluate her disease. For her sinus congestion Meagan Garcia may try taking an over-the-counter antihistamine such as Claritin and Zyrtec Meagan Garcia is tolerating her treatment fairly well.  Meagan Garcia was advised to call immediately Meagan Garcia has any concerning symptoms in the interval.  The patient voices understanding of current disease status and treatment options and is in agreement with the current care plan.  All questions were answered. The patient knows to call the clinic with any problems, questions or concerns. We can certainly see the patient much sooner if necessary.  Carlton Adam PA-C  ADDENDUM: Hematology/Oncology Attending: I had the face to face encounter with the patient today. I recommended her care plan. This is a very pleasant 63 years old [place] American female with history of stage IIIB non-small cell lung cancer currently undergoing concurrent chemoradiation with weekly carboplatin and paclitaxel. The patient is tolerating her treatment fairly well and her last cycle of the chemotherapy as scheduled today. Meagan Garcia has a palpable small lymph node in the left supraclavicular area. I recommended for the patient to proceed with the last dose of her concurrent chemoradiation. Meagan Garcia would come back for follow up visit in one month with repeat CT scan of the neck and chest, abdomen and pelvis for restaging of her disease. Meagan Garcia was advised to call immediately if Meagan Garcia has any concerning symptoms in the interval.  Disclaimer: This note was dictated with voice recognition software. Similar sounding words can inadvertently be transcribed and may not be corrected upon review. Eilleen Kempf., MD 04/18/2013

## 2013-04-18 NOTE — Telephone Encounter (Signed)
gv and printed aptp scehd and avs forpt for Feb and March

## 2013-04-18 NOTE — Patient Instructions (Signed)
[facility] Discharge Instructions for Patients Receiving Chemotherapy  Today you received the following chemotherapy agents Taxol/Carboplatin To help prevent nausea and vomiting after your treatment, we encourage you to take your nausea medication as prescribed.  If you develop nausea and vomiting that is not controlled by your nausea medication, call the clinic.   BELOW ARE SYMPTOMS THAT SHOULD BE REPORTED IMMEDIATELY:  *FEVER GREATER THAN 100.5 F  *CHILLS WITH OR WITHOUT FEVER  NAUSEA AND VOMITING THAT IS NOT CONTROLLED WITH YOUR NAUSEA MEDICATION  *UNUSUAL SHORTNESS OF BREATH  *UNUSUAL BRUISING OR BLEEDING  TENDERNESS IN MOUTH AND THROAT WITH OR WITHOUT PRESENCE OF ULCERS  *URINARY PROBLEMS  *BOWEL PROBLEMS  UNUSUAL RASH Items with * indicate a potential emergency and should be followed up as soon as possible.  Feel free to call the clinic you have any questions or concerns. The clinic phone number is [phone].    

## 2013-04-18 NOTE — Patient Instructions (Signed)
Complete your course of concurrent chemoradiation as scheduled Follow up with Dr. Julien Nordmann in approximately one month after completing your course of concurrent chemoradiation with restaging CT scan of your neck, chest, abdomen and pelvis to reevaluate her disease You may take Claritin her Zyrtec for your of sinus congestive symptoms. If the symptoms persist followup with your primary care physician

## 2013-04-19 ENCOUNTER — Ambulatory Visit
Admission: RE | Admit: 2013-04-19 | Discharge: 2013-04-19 | Disposition: A | Discharge status: Home / Self Care | Payer: BC Managed Care – PPO | Source: Ambulatory Visit | Attending: Radiation Oncology | Admitting: Radiation Oncology

## 2013-04-19 ENCOUNTER — Telehealth: Payer: Self-pay | Admitting: Oncology

## 2013-04-19 VITALS — BP 115/65 | HR 102 | Temp 97.7°F | Ht 63.0 in | Wt 206.1 lb

## 2013-04-19 DIAGNOSIS — C3491 Malignant neoplasm of unspecified part of right bronchus or lung: Secondary | ICD-10-CM

## 2013-04-19 NOTE — Progress Notes (Signed)
  Radiation Oncology         [phone] ________________________________  Name: Meagan Garcia MRN: [id number]  Date: 04/19/2013  DOB: 1950/06/17  Weekly Radiation Therapy Management  Non-small cell cancer of right lung   Primary site: Lung (Right)   Staging method: AJCC 7th Edition   Clinical: Stage IIIB (T2b, N3, M0)   Summary: Stage IIIB (T2b, N3, M0)  Current Dose: 57.6 Gy     Planned Dose:  63 Gy  Narrative . . . . . . . . The patient presents for routine under treatment assessment.                                   The patient is without complaint except for some discomfort along the right parasternal area. She is able to swallow liquids and soft foods.  She continues to have some proctitis symptoms and I have refilled her Anusol-HC suppository prescription                                 Set-up films were reviewed.                                 The chart was checked. Physical Findings. . .  height is 5\' 3"  (1.6 m) and weight is 206 lb 1.6 oz (93.486 kg). Her temperature is 97.7 F (36.5 C). Her blood pressure is 115/65 and her pulse is 102. Her oxygen saturation is 100%. . Weight essentially stable.  No significant changes. Impression . . . . . . . The patient is tolerating radiation. Plan . . . . . . . . . . . . Continue treatment as planned.  ________________________________  -----------------------------------  Blair Promise, PhD, MD

## 2013-04-19 NOTE — Progress Notes (Signed)
Meagan Garcia has had 22 fractions to her right lung.  She is having a burning pain in her midsternal region that shoots across her right breast.  She is rating it at a 4/10.  She reports shortness of breath with activity.  Her oxygen saturation today was 100 percent on room air.  She continues to have a productive cough.  She occasionally coughs up dark red blood.  She is on Lovenox 150 mg daily.  She denies a sore throat.  She reports a raspy voice.  She had chemotherapy yesterday.   She has hyperpigmentation in her midchest and right upper back.  She is using radiaplex cream.  She has been given a 1 month follow up card.

## 2013-04-19 NOTE — Telephone Encounter (Signed)
Called in refill per Dr. Sondra Come to [facility] for hydrocortisone (ANUSOL-HC) 25 MG suppository 12 suppository: Place 1 suppository (25 mg total) rectally 2 (two) times daily. - Rectal. Disp 12. 0 refills.

## 2013-04-20 ENCOUNTER — Ambulatory Visit
Admission: RE | Admit: 2013-04-20 | Discharge: 2013-04-20 | Disposition: A | Discharge status: Home / Self Care | Payer: BC Managed Care – PPO | Source: Ambulatory Visit | Attending: Radiation Oncology | Admitting: Radiation Oncology

## 2013-04-20 ENCOUNTER — Ambulatory Visit (HOSPITAL_BASED_OUTPATIENT_CLINIC_OR_DEPARTMENT_OTHER): Payer: BC Managed Care – PPO

## 2013-04-20 VITALS — BP 116/64 | HR 101 | Temp 97.4°F

## 2013-04-20 DIAGNOSIS — Z452 Encounter for adjustment and management of vascular access device: Secondary | ICD-10-CM

## 2013-04-20 DIAGNOSIS — C3400 Malignant neoplasm of unspecified main bronchus: Secondary | ICD-10-CM

## 2013-04-20 MED ORDER — HEPARIN SOD (PORK) LOCK FLUSH 100 UNIT/ML IV SOLN
500.0000 [IU] | Freq: Once | INTRAVENOUS | Status: AC
  Administered 2013-04-20: 500 [IU] via INTRAVENOUS
  Filled 2013-04-20: qty 5

## 2013-04-20 MED ORDER — SODIUM CHLORIDE 0.9 % IJ SOLN
10.0000 mL | INTRAMUSCULAR | Status: DC | PRN
  Administered 2013-04-20: 10 mL via INTRAVENOUS
  Filled 2013-04-20: qty 10

## 2013-04-20 NOTE — Patient Instructions (Signed)
PICC Home Guide A peripherally inserted central catheter (PICC) is a long, thin, flexible tube that is inserted into a vein in the upper arm. It is a form of intravenous (IV) access. It is considered to be a "central" line because the tip of the PICC ends in a large vein in your chest. This large vein is called the superior vena cava (SVC). The PICC tip ends in the SVC because there is a lot of blood flow in the SVC. This allows medicines and IV fluids to be quickly distributed throughout the body. The PICC is inserted using a sterile technique by a specially trained nurse or physician. After the PICC is inserted, a chest X-ray exam is done to be sure it is in the correct place.  A PICC may be placed for different reasons, such as:  To give medicines and liquid nutrition that can only be given through a central line. Examples are:  Certain antibiotic treatments.  Chemotherapy.  Total parenteral nutrition (TPN).  To take frequent blood samples.  To give IV fluids and blood products.  If there is difficulty placing a peripheral intravenous (PIV) catheter. If taken care of properly, a PICC can remain in place for several months. A PICC can also allow a person to go home from the hospital early. Medicine and PICC care can be managed at home by a family member or home health care team. WHAT PROBLEMS CAN HAPPEN WHEN I HAVE A PICC? Problems with a PICC can occasionally occur. These may include:  A blood clot (thrombus) forming in or at the tip of the PICC. This can cause the PICC to become clogged. A clot-dissolving medicine called tissue plasminogen activator (tPA) can be given through the PICC to help break up the clot.  Inflammation of the vein (phlebitis) in which the PICC is placed. Signs of inflammation may include redness, pain at the insertion site, red streaks, or being able to feel a "cord" in the vein where the PICC is located.  Infection in the PICC or at the insertion site. Signs of  infection may include fever, chills, redness, swelling, or pus drainage from the PICC insertion site.  PICC movement (malposition). The PICC tip may move from its original position due to excessive physical activity, forceful coughing, sneezing, or vomiting.  A break or cut in the PICC. It is important to not use scissors near the PICC.  Nerve or tendon irritation or injury during PICC insertion. WHAT SHOULD I KEEP IN MIND ABOUT ACTIVITIES WHEN I HAVE A PICC?  You may bend your arm and move it freely. If your PICC is near or at the bend of your elbow, avoid activity with repeated motion at the elbow.  Rest at home for the remainder of the day following PICC line insertion.  Avoid lifting heavy objects as instructed by your health care provider.  Avoid using a crutch with the arm on the same side as your PICC. You may need to use a walker. WHAT SHOULD I KNOW ABOUT MY PICC DRESSING?  Keep your PICC bandage (dressing) clean and dry to prevent infection.  Ask your health care provider when you may shower. Ask your health care provider to teach you how to wrap the PICC when you do take a shower.  Change the PICC dressing as instructed by your health care provider.  Change your PICC dressing if it becomes loose or wet. WHAT SHOULD I KNOW ABOUT PICC CARE?  Check the PICC insertion site daily for   leakage, redness, swelling, or pain.  Do not take a bath, swim, or use hot tubs when you have a PICC. Cover PICC line with clear plastic wrap and tape to keep it dry while showering.  Flush the PICC as directed by your health care provider. Let your health care provider know right away if the PICC is difficult to flush or does not flush. Do not use force to flush the PICC.  Do not use a syringe that is less than 10 mL to flush the PICC.  Never pull or tug on the PICC.  Avoid blood pressure checks on the arm with the PICC.  Keep your PICC identification card with you at all times.  Do not  take the PICC out yourself. Only a trained clinical professional should remove the PICC. SEEK IMMEDIATE MEDICAL CARE IF:  Your PICC is accidently pulled all the way out. If this happens, cover the insertion site with a bandage or gauze dressing. Do not throw the PICC away. Your health care provider will need to inspect it.  Your PICC was tugged or pulled and has partially come out. Do not  push the PICC back in.  There is any type of drainage, redness, or swelling where the PICC enters the skin.  You cannot flush the PICC, it is difficult to flush, or the PICC leaks around the insertion site when it is flushed.  You hear a "flushing" sound when the PICC is flushed.  You have pain, discomfort, or numbness in your arm, shoulder, or jaw on the same side as the PICC.  You feel your heart "racing" or skipping beats.  You notice a hole or tear in the PICC.  You develop chills or a fever. MAKE SURE YOU:   Understand these instructions.  Will watch your condition.  Will get help right away if you are not doing well or get worse. Document Released: 08/31/2002 Document Revised: 12/15/2012 Document Reviewed: 11/01/2012 [employer] Patient Information 2014 [employer].  

## 2013-04-21 ENCOUNTER — Ambulatory Visit
Admission: RE | Admit: 2013-04-21 | Discharge: 2013-04-21 | Disposition: A | Discharge status: Home / Self Care | Payer: BC Managed Care – PPO | Source: Ambulatory Visit | Attending: Radiation Oncology | Admitting: Radiation Oncology

## 2013-04-21 ENCOUNTER — Other Ambulatory Visit: Payer: Self-pay | Admitting: Internal Medicine

## 2013-04-22 ENCOUNTER — Ambulatory Visit (HOSPITAL_BASED_OUTPATIENT_CLINIC_OR_DEPARTMENT_OTHER): Payer: BC Managed Care – PPO

## 2013-04-22 ENCOUNTER — Ambulatory Visit
Admission: RE | Admit: 2013-04-22 | Discharge: 2013-04-22 | Disposition: A | Discharge status: Home / Self Care | Payer: BC Managed Care – PPO | Source: Ambulatory Visit | Attending: Radiation Oncology | Admitting: Radiation Oncology

## 2013-04-22 VITALS — BP 111/60 | HR 101 | Temp 98.3°F

## 2013-04-22 DIAGNOSIS — C786 Secondary malignant neoplasm of retroperitoneum and peritoneum: Secondary | ICD-10-CM

## 2013-04-22 DIAGNOSIS — Z452 Encounter for adjustment and management of vascular access device: Secondary | ICD-10-CM

## 2013-04-22 DIAGNOSIS — C182 Malignant neoplasm of ascending colon: Secondary | ICD-10-CM

## 2013-04-22 MED ORDER — HEPARIN SOD (PORK) LOCK FLUSH 100 UNIT/ML IV SOLN
500.0000 [IU] | Freq: Once | INTRAVENOUS | Status: AC
  Administered 2013-04-22: 250 [IU] via INTRAVENOUS
  Filled 2013-04-22: qty 5

## 2013-04-22 MED ORDER — SODIUM CHLORIDE 0.9 % IJ SOLN
10.0000 mL | INTRAMUSCULAR | Status: DC | PRN
  Administered 2013-04-22: 10 mL via INTRAVENOUS
  Filled 2013-04-22: qty 10

## 2013-04-25 ENCOUNTER — Ambulatory Visit (HOSPITAL_BASED_OUTPATIENT_CLINIC_OR_DEPARTMENT_OTHER): Payer: BC Managed Care – PPO

## 2013-04-25 VITALS — BP 114/61 | HR 103 | Temp 97.8°F

## 2013-04-25 DIAGNOSIS — Z452 Encounter for adjustment and management of vascular access device: Secondary | ICD-10-CM

## 2013-04-25 DIAGNOSIS — C182 Malignant neoplasm of ascending colon: Secondary | ICD-10-CM

## 2013-04-25 DIAGNOSIS — C786 Secondary malignant neoplasm of retroperitoneum and peritoneum: Secondary | ICD-10-CM

## 2013-04-25 DIAGNOSIS — C3491 Malignant neoplasm of unspecified part of right bronchus or lung: Secondary | ICD-10-CM

## 2013-04-25 MED ORDER — HEPARIN SOD (PORK) LOCK FLUSH 100 UNIT/ML IV SOLN
250.0000 [IU] | Freq: Once | INTRAVENOUS | Status: AC
  Administered 2013-04-25: 250 [IU] via INTRAVENOUS
  Filled 2013-04-25: qty 5

## 2013-04-25 MED ORDER — SODIUM CHLORIDE 0.9 % IJ SOLN
10.0000 mL | INTRAMUSCULAR | Status: DC | PRN
  Administered 2013-04-25: 10 mL via INTRAVENOUS
  Filled 2013-04-25: qty 10

## 2013-04-25 MED ORDER — HEPARIN SOD (PORK) LOCK FLUSH 100 UNIT/ML IV SOLN
500.0000 [IU] | Freq: Once | INTRAVENOUS | Status: DC
  Filled 2013-04-25: qty 5

## 2013-04-25 NOTE — Patient Instructions (Addendum)
PICC Home Guide A peripherally inserted central catheter (PICC) is a long, thin, flexible tube that is inserted into a vein in the upper arm. It is a form of intravenous (IV) access. It is considered to be a "central" line because the tip of the PICC ends in a large vein in your chest. This large vein is called the superior vena cava (SVC). The PICC tip ends in the SVC because there is a lot of blood flow in the SVC. This allows medicines and IV fluids to be quickly distributed throughout the body. The PICC is inserted using a sterile technique by a specially trained nurse or physician. After the PICC is inserted, a chest X-ray exam is done to be sure it is in the correct place.  A PICC may be placed for different reasons, such as:  To give medicines and liquid nutrition that can only be given through a central line. Examples are:  Certain antibiotic treatments.  Chemotherapy.  Total parenteral nutrition (TPN).  To take frequent blood samples.  To give IV fluids and blood products.  If there is difficulty placing a peripheral intravenous (PIV) catheter. If taken care of properly, a PICC can remain in place for several months. A PICC can also allow a person to go home from the hospital early. Medicine and PICC care can be managed at home by a family member or home health care team. WHAT PROBLEMS CAN HAPPEN WHEN I HAVE A PICC? Problems with a PICC can occasionally occur. These may include:  A blood clot (thrombus) forming in or at the tip of the PICC. This can cause the PICC to become clogged. A clot-dissolving medicine called tissue plasminogen activator (tPA) can be given through the PICC to help break up the clot.  Inflammation of the vein (phlebitis) in which the PICC is placed. Signs of inflammation may include redness, pain at the insertion site, red streaks, or being able to feel a "cord" in the vein where the PICC is located.  Infection in the PICC or at the insertion site. Signs of  infection may include fever, chills, redness, swelling, or pus drainage from the PICC insertion site.  PICC movement (malposition). The PICC tip may move from its original position due to excessive physical activity, forceful coughing, sneezing, or vomiting.  A break or cut in the PICC. It is important to not use scissors near the PICC.  Nerve or tendon irritation or injury during PICC insertion. WHAT SHOULD I KEEP IN MIND ABOUT ACTIVITIES WHEN I HAVE A PICC?  You may bend your arm and move it freely. If your PICC is near or at the bend of your elbow, avoid activity with repeated motion at the elbow.  Rest at home for the remainder of the day following PICC line insertion.  Avoid lifting heavy objects as instructed by your health care provider.  Avoid using a crutch with the arm on the same side as your PICC. You may need to use a walker. WHAT SHOULD I KNOW ABOUT MY PICC DRESSING?  Keep your PICC bandage (dressing) clean and dry to prevent infection.  Ask your health care provider when you may shower. Ask your health care provider to teach you how to wrap the PICC when you do take a shower.  Change the PICC dressing as instructed by your health care provider.  Change your PICC dressing if it becomes loose or wet. WHAT SHOULD I KNOW ABOUT PICC CARE?  Check the PICC insertion site daily for   leakage, redness, swelling, or pain.  Do not take a bath, swim, or use hot tubs when you have a PICC. Cover PICC line with clear plastic wrap and tape to keep it dry while showering.  Flush the PICC as directed by your health care provider. Let your health care provider know right away if the PICC is difficult to flush or does not flush. Do not use force to flush the PICC.  Do not use a syringe that is less than 10 mL to flush the PICC.  Never pull or tug on the PICC.  Avoid blood pressure checks on the arm with the PICC.  Keep your PICC identification card with you at all times.  Do not  take the PICC out yourself. Only a trained clinical professional should remove the PICC. SEEK IMMEDIATE MEDICAL CARE IF:  Your PICC is accidently pulled all the way out. If this happens, cover the insertion site with a bandage or gauze dressing. Do not throw the PICC away. Your health care provider will need to inspect it.  Your PICC was tugged or pulled and has partially come out. Do not  push the PICC back in.  There is any type of drainage, redness, or swelling where the PICC enters the skin.  You cannot flush the PICC, it is difficult to flush, or the PICC leaks around the insertion site when it is flushed.  You hear a "flushing" sound when the PICC is flushed.  You have pain, discomfort, or numbness in your arm, shoulder, or jaw on the same side as the PICC.  You feel your heart "racing" or skipping beats.  You notice a hole or tear in the PICC.  You develop chills or a fever. MAKE SURE YOU:   Understand these instructions.  Will watch your condition.  Will get help right away if you are not doing well or get worse. Document Released: 08/31/2002 Document Revised: 12/15/2012 Document Reviewed: 11/01/2012 [employer] Patient Information 2014 [employer].  

## 2013-04-27 ENCOUNTER — Ambulatory Visit (HOSPITAL_BASED_OUTPATIENT_CLINIC_OR_DEPARTMENT_OTHER): Payer: BC Managed Care – PPO

## 2013-04-27 VITALS — BP 122/63 | HR 98 | Temp 97.2°F

## 2013-04-27 DIAGNOSIS — Z452 Encounter for adjustment and management of vascular access device: Secondary | ICD-10-CM

## 2013-04-27 DIAGNOSIS — C182 Malignant neoplasm of ascending colon: Secondary | ICD-10-CM

## 2013-04-27 DIAGNOSIS — C786 Secondary malignant neoplasm of retroperitoneum and peritoneum: Secondary | ICD-10-CM

## 2013-04-27 MED ORDER — SODIUM CHLORIDE 0.9 % IJ SOLN
10.0000 mL | INTRAMUSCULAR | Status: DC | PRN
  Administered 2013-04-27: 10 mL via INTRAVENOUS
  Filled 2013-04-27: qty 10

## 2013-04-27 MED ORDER — HEPARIN SOD (PORK) LOCK FLUSH 100 UNIT/ML IV SOLN
500.0000 [IU] | Freq: Once | INTRAVENOUS | Status: AC
  Administered 2013-04-27: 250 [IU] via INTRAVENOUS
  Filled 2013-04-27: qty 5

## 2013-04-27 NOTE — Patient Instructions (Signed)
PICC Home Guide A peripherally inserted central catheter (PICC) is a long, thin, flexible tube that is inserted into a vein in the upper arm. It is a form of intravenous (IV) access. It is considered to be a "central" line because the tip of the PICC ends in a large vein in your chest. This large vein is called the superior vena cava (SVC). The PICC tip ends in the SVC because there is a lot of blood flow in the SVC. This allows medicines and IV fluids to be quickly distributed throughout the body. The PICC is inserted using a sterile technique by a specially trained nurse or physician. After the PICC is inserted, a chest X-ray exam is done to be sure it is in the correct place.  A PICC may be placed for different reasons, such as:  To give medicines and liquid nutrition that can only be given through a central line. Examples are:  Certain antibiotic treatments.  Chemotherapy.  Total parenteral nutrition (TPN).  To take frequent blood samples.  To give IV fluids and blood products.  If there is difficulty placing a peripheral intravenous (PIV) catheter. If taken care of properly, a PICC can remain in place for several months. A PICC can also allow a person to go home from the hospital early. Medicine and PICC care can be managed at home by a family member or home health care team. WHAT PROBLEMS CAN HAPPEN WHEN I HAVE A PICC? Problems with a PICC can occasionally occur. These may include:  A blood clot (thrombus) forming in or at the tip of the PICC. This can cause the PICC to become clogged. A clot-dissolving medicine called tissue plasminogen activator (tPA) can be given through the PICC to help break up the clot.  Inflammation of the vein (phlebitis) in which the PICC is placed. Signs of inflammation may include redness, pain at the insertion site, red streaks, or being able to feel a "cord" in the vein where the PICC is located.  Infection in the PICC or at the insertion site. Signs of  infection may include fever, chills, redness, swelling, or pus drainage from the PICC insertion site.  PICC movement (malposition). The PICC tip may move from its original position due to excessive physical activity, forceful coughing, sneezing, or vomiting.  A break or cut in the PICC. It is important to not use scissors near the PICC.  Nerve or tendon irritation or injury during PICC insertion. WHAT SHOULD I KEEP IN MIND ABOUT ACTIVITIES WHEN I HAVE A PICC?  You may bend your arm and move it freely. If your PICC is near or at the bend of your elbow, avoid activity with repeated motion at the elbow.  Rest at home for the remainder of the day following PICC line insertion.  Avoid lifting heavy objects as instructed by your health care provider.  Avoid using a crutch with the arm on the same side as your PICC. You may need to use a walker. WHAT SHOULD I KNOW ABOUT MY PICC DRESSING?  Keep your PICC bandage (dressing) clean and dry to prevent infection.  Ask your health care provider when you may shower. Ask your health care provider to teach you how to wrap the PICC when you do take a shower.  Change the PICC dressing as instructed by your health care provider.  Change your PICC dressing if it becomes loose or wet. WHAT SHOULD I KNOW ABOUT PICC CARE?  Check the PICC insertion site daily for   leakage, redness, swelling, or pain.  Do not take a bath, swim, or use hot tubs when you have a PICC. Cover PICC line with clear plastic wrap and tape to keep it dry while showering.  Flush the PICC as directed by your health care provider. Let your health care provider know right away if the PICC is difficult to flush or does not flush. Do not use force to flush the PICC.  Do not use a syringe that is less than 10 mL to flush the PICC.  Never pull or tug on the PICC.  Avoid blood pressure checks on the arm with the PICC.  Keep your PICC identification card with you at all times.  Do not  take the PICC out yourself. Only a trained clinical professional should remove the PICC. SEEK IMMEDIATE MEDICAL CARE IF:  Your PICC is accidently pulled all the way out. If this happens, cover the insertion site with a bandage or gauze dressing. Do not throw the PICC away. Your health care provider will need to inspect it.  Your PICC was tugged or pulled and has partially come out. Do not  push the PICC back in.  There is any type of drainage, redness, or swelling where the PICC enters the skin.  You cannot flush the PICC, it is difficult to flush, or the PICC leaks around the insertion site when it is flushed.  You hear a "flushing" sound when the PICC is flushed.  You have pain, discomfort, or numbness in your arm, shoulder, or jaw on the same side as the PICC.  You feel your heart "racing" or skipping beats.  You notice a hole or tear in the PICC.  You develop chills or a fever. MAKE SURE YOU:   Understand these instructions.  Will watch your condition.  Will get help right away if you are not doing well or get worse. Document Released: 08/31/2002 Document Revised: 12/15/2012 Document Reviewed: 11/01/2012 [employer] Patient Information 2014 [employer].  

## 2013-04-29 ENCOUNTER — Telehealth: Payer: Self-pay | Admitting: *Deleted

## 2013-04-29 ENCOUNTER — Ambulatory Visit (HOSPITAL_BASED_OUTPATIENT_CLINIC_OR_DEPARTMENT_OTHER): Payer: BC Managed Care – PPO

## 2013-04-29 VITALS — BP 118/76 | HR 108 | Temp 98.1°F

## 2013-04-29 DIAGNOSIS — C3400 Malignant neoplasm of unspecified main bronchus: Secondary | ICD-10-CM

## 2013-04-29 DIAGNOSIS — C786 Secondary malignant neoplasm of retroperitoneum and peritoneum: Secondary | ICD-10-CM

## 2013-04-29 DIAGNOSIS — Z452 Encounter for adjustment and management of vascular access device: Secondary | ICD-10-CM

## 2013-04-29 MED ORDER — HEPARIN SOD (PORK) LOCK FLUSH 100 UNIT/ML IV SOLN
500.0000 [IU] | Freq: Once | INTRAVENOUS | Status: AC
  Administered 2013-04-29: 250 [IU] via INTRAVENOUS
  Filled 2013-04-29: qty 5

## 2013-04-29 MED ORDER — SODIUM CHLORIDE 0.9 % IJ SOLN
10.0000 mL | INTRAMUSCULAR | Status: DC | PRN
  Administered 2013-04-29: 10 mL via INTRAVENOUS
  Filled 2013-04-29: qty 10

## 2013-04-29 NOTE — Telephone Encounter (Signed)
Pt in lobby wanting to speak to RN.  She has a bruise on her arm that appears to be healing.  She also has found a lump under her arm that is new and is dark in coloration.  She is c/o reflux.  She has a rx for prilosec 40mg  daily but is not taking it.  Informed her that Dr Vista Mink is off today but will leave him and his RN the msg and they will call Monday.  Asked her to contact radiation regarding the reflux.  Advised zantac for now or tums but if these do not work to call them.  She verbalized understanding.  SLJ

## 2013-05-01 ENCOUNTER — Encounter: Payer: Self-pay | Admitting: Radiation Oncology

## 2013-05-01 NOTE — Progress Notes (Signed)
   Department of Radiation Oncology  Phone:  [phone] Fax:        [phone]  Simulation note-performed on January 8  Today the patient underwent additional planning for radiation therapy directed at the mediastinal and right upper lung area. On cone beam CT imaging the patient's lung showed some reexpansion altering the target area for treatment. Patient was therefore taken back to the CT scanner for re- planning. Patient proceeded to undergo CT scan through the chest area. Based on this new scan the patient will have set up of her new conformal radiation fields using multiple beams.  Blair Promise M.D.

## 2013-05-01 NOTE — Progress Notes (Signed)
  Radiation Oncology         [phone]) [phone] ________________________________  Name: Meagan Garcia MRN: [id number]  Date: 05/01/2013  DOB: [birth year]  End of Treatment Note  Diagnosis:     Non-small cell cancer of right lung  Primary site: Lung (Right)  Staging method: AJCC 7th Edition  Clinical: Stage IIIB (T2b, N3, M0)  Summary: Stage IIIB (T2b, N3, M0)   Indication for treatment:  Definitive treatment along with radiosensitizing       Radiation treatment dates:   December 24 through February 13  Site/dose:   Right upper lung and mediastinal area, 63 gray in 35 fractions  Beams/energy:   3-D conformal  Narrative: The patient tolerated radiation treatment relatively well.   She did have some fatigue as she progressed the treatment. Her breathing overall improved significantly with reexpansion of her lung area  Plan: The patient has completed radiation treatment. The patient will return to radiation oncology clinic for routine followup in one month. I advised them to call or return sooner if they have any questions or concerns related to their recovery or treatment.  -----------------------------------  Blair Promise, PhD, MD

## 2013-05-02 ENCOUNTER — Ambulatory Visit (HOSPITAL_BASED_OUTPATIENT_CLINIC_OR_DEPARTMENT_OTHER): Payer: BC Managed Care – PPO

## 2013-05-02 VITALS — BP 110/59 | HR 98 | Temp 98.1°F

## 2013-05-02 DIAGNOSIS — Z452 Encounter for adjustment and management of vascular access device: Secondary | ICD-10-CM

## 2013-05-02 DIAGNOSIS — C3400 Malignant neoplasm of unspecified main bronchus: Secondary | ICD-10-CM

## 2013-05-02 MED ORDER — HEPARIN SOD (PORK) LOCK FLUSH 100 UNIT/ML IV SOLN
500.0000 [IU] | Freq: Once | INTRAVENOUS | Status: AC
  Administered 2013-05-02: 250 [IU] via INTRAVENOUS
  Filled 2013-05-02: qty 5

## 2013-05-02 MED ORDER — SODIUM CHLORIDE 0.9 % IJ SOLN
10.0000 mL | INTRAMUSCULAR | Status: DC | PRN
  Administered 2013-05-02: 10 mL via INTRAVENOUS
  Filled 2013-05-02: qty 10

## 2013-05-02 NOTE — Patient Instructions (Signed)
PICC Home Guide A peripherally inserted central catheter (PICC) is a long, thin, flexible tube that is inserted into a vein in the upper arm. It is a form of intravenous (IV) access. It is considered to be a "central" line because the tip of the PICC ends in a large vein in your chest. This large vein is called the superior vena cava (SVC). The PICC tip ends in the SVC because there is a lot of blood flow in the SVC. This allows medicines and IV fluids to be quickly distributed throughout the body. The PICC is inserted using a sterile technique by a specially trained nurse or physician. After the PICC is inserted, a chest X-ray exam is done to be sure it is in the correct place.  A PICC may be placed for different reasons, such as:  To give medicines and liquid nutrition that can only be given through a central line. Examples are:  Certain antibiotic treatments.  Chemotherapy.  Total parenteral nutrition (TPN).  To take frequent blood samples.  To give IV fluids and blood products.  If there is difficulty placing a peripheral intravenous (PIV) catheter. If taken care of properly, a PICC can remain in place for several months. A PICC can also allow a person to go home from the hospital early. Medicine and PICC care can be managed at home by a family member or home health care team. WHAT PROBLEMS CAN HAPPEN WHEN I HAVE A PICC? Problems with a PICC can occasionally occur. These may include:  A blood clot (thrombus) forming in or at the tip of the PICC. This can cause the PICC to become clogged. A clot-dissolving medicine called tissue plasminogen activator (tPA) can be given through the PICC to help break up the clot.  Inflammation of the vein (phlebitis) in which the PICC is placed. Signs of inflammation may include redness, pain at the insertion site, red streaks, or being able to feel a "cord" in the vein where the PICC is located.  Infection in the PICC or at the insertion site. Signs of  infection may include fever, chills, redness, swelling, or pus drainage from the PICC insertion site.  PICC movement (malposition). The PICC tip may move from its original position due to excessive physical activity, forceful coughing, sneezing, or vomiting.  A break or cut in the PICC. It is important to not use scissors near the PICC.  Nerve or tendon irritation or injury during PICC insertion. WHAT SHOULD I KEEP IN MIND ABOUT ACTIVITIES WHEN I HAVE A PICC?  You may bend your arm and move it freely. If your PICC is near or at the bend of your elbow, avoid activity with repeated motion at the elbow.  Rest at home for the remainder of the day following PICC line insertion.  Avoid lifting heavy objects as instructed by your health care provider.  Avoid using a crutch with the arm on the same side as your PICC. You may need to use a walker. WHAT SHOULD I KNOW ABOUT MY PICC DRESSING?  Keep your PICC bandage (dressing) clean and dry to prevent infection.  Ask your health care provider when you may shower. Ask your health care provider to teach you how to wrap the PICC when you do take a shower.  Change the PICC dressing as instructed by your health care provider.  Change your PICC dressing if it becomes loose or wet. WHAT SHOULD I KNOW ABOUT PICC CARE?  Check the PICC insertion site daily for   leakage, redness, swelling, or pain.  Do not take a bath, swim, or use hot tubs when you have a PICC. Cover PICC line with clear plastic wrap and tape to keep it dry while showering.  Flush the PICC as directed by your health care provider. Let your health care provider know right away if the PICC is difficult to flush or does not flush. Do not use force to flush the PICC.  Do not use a syringe that is less than 10 mL to flush the PICC.  Never pull or tug on the PICC.  Avoid blood pressure checks on the arm with the PICC.  Keep your PICC identification card with you at all times.  Do not  take the PICC out yourself. Only a trained clinical professional should remove the PICC. SEEK IMMEDIATE MEDICAL CARE IF:  Your PICC is accidently pulled all the way out. If this happens, cover the insertion site with a bandage or gauze dressing. Do not throw the PICC away. Your health care provider will need to inspect it.  Your PICC was tugged or pulled and has partially come out. Do not  push the PICC back in.  There is any type of drainage, redness, or swelling where the PICC enters the skin.  You cannot flush the PICC, it is difficult to flush, or the PICC leaks around the insertion site when it is flushed.  You hear a "flushing" sound when the PICC is flushed.  You have pain, discomfort, or numbness in your arm, shoulder, or jaw on the same side as the PICC.  You feel your heart "racing" or skipping beats.  You notice a hole or tear in the PICC.  You develop chills or a fever. MAKE SURE YOU:   Understand these instructions.  Will watch your condition.  Will get help right away if you are not doing well or get worse. Document Released: 08/31/2002 Document Revised: 12/15/2012 Document Reviewed: 11/01/2012 [employer] Patient Information 2014 [employer].  

## 2013-05-03 ENCOUNTER — Encounter: Payer: BC Managed Care – PPO | Admitting: Physical Therapy

## 2013-05-04 ENCOUNTER — Ambulatory Visit (HOSPITAL_BASED_OUTPATIENT_CLINIC_OR_DEPARTMENT_OTHER): Payer: BC Managed Care – PPO

## 2013-05-04 VITALS — BP 117/62 | HR 98 | Temp 97.9°F

## 2013-05-04 DIAGNOSIS — C3400 Malignant neoplasm of unspecified main bronchus: Secondary | ICD-10-CM

## 2013-05-04 DIAGNOSIS — Z452 Encounter for adjustment and management of vascular access device: Secondary | ICD-10-CM

## 2013-05-04 MED ORDER — HEPARIN SOD (PORK) LOCK FLUSH 100 UNIT/ML IV SOLN
500.0000 [IU] | Freq: Once | INTRAVENOUS | Status: AC
  Administered 2013-05-04: 250 [IU] via INTRAVENOUS
  Filled 2013-05-04: qty 5

## 2013-05-04 MED ORDER — SODIUM CHLORIDE 0.9 % IJ SOLN
10.0000 mL | INTRAMUSCULAR | Status: DC | PRN
  Administered 2013-05-04: 10 mL via INTRAVENOUS
  Filled 2013-05-04: qty 10

## 2013-05-04 NOTE — Patient Instructions (Signed)
PICC Home Guide A peripherally inserted central catheter (PICC) is a long, thin, flexible tube that is inserted into a vein in the upper arm. It is a form of intravenous (IV) access. It is considered to be a "central" line because the tip of the PICC ends in a large vein in your chest. This large vein is called the superior vena cava (SVC). The PICC tip ends in the SVC because there is a lot of blood flow in the SVC. This allows medicines and IV fluids to be quickly distributed throughout the body. The PICC is inserted using a sterile technique by a specially trained nurse or physician. After the PICC is inserted, a chest X-ray exam is done to be sure it is in the correct place.  A PICC may be placed for different reasons, such as:  To give medicines and liquid nutrition that can only be given through a central line. Examples are:  Certain antibiotic treatments.  Chemotherapy.  Total parenteral nutrition (TPN).  To take frequent blood samples.  To give IV fluids and blood products.  If there is difficulty placing a peripheral intravenous (PIV) catheter. If taken care of properly, a PICC can remain in place for several months. A PICC can also allow a person to go home from the hospital early. Medicine and PICC care can be managed at home by a family member or home health care team. WHAT PROBLEMS CAN HAPPEN WHEN I HAVE A PICC? Problems with a PICC can occasionally occur. These may include:  A blood clot (thrombus) forming in or at the tip of the PICC. This can cause the PICC to become clogged. A clot-dissolving medicine called tissue plasminogen activator (tPA) can be given through the PICC to help break up the clot.  Inflammation of the vein (phlebitis) in which the PICC is placed. Signs of inflammation may include redness, pain at the insertion site, red streaks, or being able to feel a "cord" in the vein where the PICC is located.  Infection in the PICC or at the insertion site. Signs of  infection may include fever, chills, redness, swelling, or pus drainage from the PICC insertion site.  PICC movement (malposition). The PICC tip may move from its original position due to excessive physical activity, forceful coughing, sneezing, or vomiting.  A break or cut in the PICC. It is important to not use scissors near the PICC.  Nerve or tendon irritation or injury during PICC insertion. WHAT SHOULD I KEEP IN MIND ABOUT ACTIVITIES WHEN I HAVE A PICC?  You may bend your arm and move it freely. If your PICC is near or at the bend of your elbow, avoid activity with repeated motion at the elbow.  Rest at home for the remainder of the day following PICC line insertion.  Avoid lifting heavy objects as instructed by your health care provider.  Avoid using a crutch with the arm on the same side as your PICC. You may need to use a walker. WHAT SHOULD I KNOW ABOUT MY PICC DRESSING?  Keep your PICC bandage (dressing) clean and dry to prevent infection.  Ask your health care provider when you may shower. Ask your health care provider to teach you how to wrap the PICC when you do take a shower.  Change the PICC dressing as instructed by your health care provider.  Change your PICC dressing if it becomes loose or wet. WHAT SHOULD I KNOW ABOUT PICC CARE?  Check the PICC insertion site daily for   leakage, redness, swelling, or pain.  Do not take a bath, swim, or use hot tubs when you have a PICC. Cover PICC line with clear plastic wrap and tape to keep it dry while showering.  Flush the PICC as directed by your health care provider. Let your health care provider know right away if the PICC is difficult to flush or does not flush. Do not use force to flush the PICC.  Do not use a syringe that is less than 10 mL to flush the PICC.  Never pull or tug on the PICC.  Avoid blood pressure checks on the arm with the PICC.  Keep your PICC identification card with you at all times.  Do not  take the PICC out yourself. Only a trained clinical professional should remove the PICC. SEEK IMMEDIATE MEDICAL CARE IF:  Your PICC is accidently pulled all the way out. If this happens, cover the insertion site with a bandage or gauze dressing. Do not throw the PICC away. Your health care provider will need to inspect it.  Your PICC was tugged or pulled and has partially come out. Do not  push the PICC back in.  There is any type of drainage, redness, or swelling where the PICC enters the skin.  You cannot flush the PICC, it is difficult to flush, or the PICC leaks around the insertion site when it is flushed.  You hear a "flushing" sound when the PICC is flushed.  You have pain, discomfort, or numbness in your arm, shoulder, or jaw on the same side as the PICC.  You feel your heart "racing" or skipping beats.  You notice a hole or tear in the PICC.  You develop chills or a fever. MAKE SURE YOU:   Understand these instructions.  Will watch your condition.  Will get help right away if you are not doing well or get worse. Document Released: 08/31/2002 Document Revised: 12/15/2012 Document Reviewed: 11/01/2012 [employer] Patient Information 2014 [employer].  

## 2013-05-06 ENCOUNTER — Ambulatory Visit (HOSPITAL_BASED_OUTPATIENT_CLINIC_OR_DEPARTMENT_OTHER): Payer: BC Managed Care – PPO

## 2013-05-06 VITALS — BP 116/67 | HR 91 | Temp 97.7°F

## 2013-05-06 DIAGNOSIS — C3400 Malignant neoplasm of unspecified main bronchus: Secondary | ICD-10-CM

## 2013-05-06 DIAGNOSIS — Z452 Encounter for adjustment and management of vascular access device: Secondary | ICD-10-CM

## 2013-05-06 MED ORDER — HEPARIN SOD (PORK) LOCK FLUSH 100 UNIT/ML IV SOLN
500.0000 [IU] | Freq: Once | INTRAVENOUS | Status: AC
  Administered 2013-05-06: 250 [IU] via INTRAVENOUS
  Filled 2013-05-06: qty 5

## 2013-05-06 MED ORDER — SODIUM CHLORIDE 0.9 % IJ SOLN
10.0000 mL | INTRAMUSCULAR | Status: DC | PRN
  Administered 2013-05-06: 10 mL via INTRAVENOUS
  Filled 2013-05-06: qty 10

## 2013-05-09 ENCOUNTER — Ambulatory Visit (HOSPITAL_BASED_OUTPATIENT_CLINIC_OR_DEPARTMENT_OTHER): Payer: BC Managed Care – PPO

## 2013-05-09 VITALS — BP 120/65 | HR 94 | Temp 97.4°F | Resp 18

## 2013-05-09 DIAGNOSIS — C3400 Malignant neoplasm of unspecified main bronchus: Secondary | ICD-10-CM

## 2013-05-09 DIAGNOSIS — Z452 Encounter for adjustment and management of vascular access device: Secondary | ICD-10-CM

## 2013-05-09 DIAGNOSIS — C3490 Malignant neoplasm of unspecified part of unspecified bronchus or lung: Secondary | ICD-10-CM

## 2013-05-09 MED ORDER — SODIUM CHLORIDE 0.9 % IJ SOLN
10.0000 mL | INTRAMUSCULAR | Status: DC | PRN
  Administered 2013-05-09: 10 mL via INTRAVENOUS
  Filled 2013-05-09: qty 10

## 2013-05-09 MED ORDER — HEPARIN SOD (PORK) LOCK FLUSH 100 UNIT/ML IV SOLN
500.0000 [IU] | Freq: Once | INTRAVENOUS | Status: AC
  Administered 2013-05-09: 250 [IU] via INTRAVENOUS
  Filled 2013-05-09: qty 5

## 2013-05-09 NOTE — Patient Instructions (Signed)
PICC Home Guide A peripherally inserted central catheter (PICC) is a long, thin, flexible tube that is inserted into a vein in the upper arm. It is a form of intravenous (IV) access. It is considered to be a "central" line because the tip of the PICC ends in a large vein in your chest. This large vein is called the superior vena cava (SVC). The PICC tip ends in the SVC because there is a lot of blood flow in the SVC. This allows medicines and IV fluids to be quickly distributed throughout the body. The PICC is inserted using a sterile technique by a specially trained nurse or physician. After the PICC is inserted, a chest X-ray exam is done to be sure it is in the correct place.  A PICC may be placed for different reasons, such as:  To give medicines and liquid nutrition that can only be given through a central line. Examples are:  Certain antibiotic treatments.  Chemotherapy.  Total parenteral nutrition (TPN).  To take frequent blood samples.  To give IV fluids and blood products.  If there is difficulty placing a peripheral intravenous (PIV) catheter. If taken care of properly, a PICC can remain in place for several months. A PICC can also allow a person to go home from the hospital early. Medicine and PICC care can be managed at home by a family member or home health care team. WHAT PROBLEMS CAN HAPPEN WHEN I HAVE A PICC? Problems with a PICC can occasionally occur. These may include:  A blood clot (thrombus) forming in or at the tip of the PICC. This can cause the PICC to become clogged. A clot-dissolving medicine called tissue plasminogen activator (tPA) can be given through the PICC to help break up the clot.  Inflammation of the vein (phlebitis) in which the PICC is placed. Signs of inflammation may include redness, pain at the insertion site, red streaks, or being able to feel a "cord" in the vein where the PICC is located.  Infection in the PICC or at the insertion site. Signs of  infection may include fever, chills, redness, swelling, or pus drainage from the PICC insertion site.  PICC movement (malposition). The PICC tip may move from its original position due to excessive physical activity, forceful coughing, sneezing, or vomiting.  A break or cut in the PICC. It is important to not use scissors near the PICC.  Nerve or tendon irritation or injury during PICC insertion. WHAT SHOULD I KEEP IN MIND ABOUT ACTIVITIES WHEN I HAVE A PICC?  You may bend your arm and move it freely. If your PICC is near or at the bend of your elbow, avoid activity with repeated motion at the elbow.  Rest at home for the remainder of the day following PICC line insertion.  Avoid lifting heavy objects as instructed by your health care provider.  Avoid using a crutch with the arm on the same side as your PICC. You may need to use a walker. WHAT SHOULD I KNOW ABOUT MY PICC DRESSING?  Keep your PICC bandage (dressing) clean and dry to prevent infection.  Ask your health care provider when you may shower. Ask your health care provider to teach you how to wrap the PICC when you do take a shower.  Change the PICC dressing as instructed by your health care provider.  Change your PICC dressing if it becomes loose or wet. WHAT SHOULD I KNOW ABOUT PICC CARE?  Check the PICC insertion site daily for   leakage, redness, swelling, or pain.  Do not take a bath, swim, or use hot tubs when you have a PICC. Cover PICC line with clear plastic wrap and tape to keep it dry while showering.  Flush the PICC as directed by your health care provider. Let your health care provider know right away if the PICC is difficult to flush or does not flush. Do not use force to flush the PICC.  Do not use a syringe that is less than 10 mL to flush the PICC.  Never pull or tug on the PICC.  Avoid blood pressure checks on the arm with the PICC.  Keep your PICC identification card with you at all times.  Do not  take the PICC out yourself. Only a trained clinical professional should remove the PICC. SEEK IMMEDIATE MEDICAL CARE IF:  Your PICC is accidently pulled all the way out. If this happens, cover the insertion site with a bandage or gauze dressing. Do not throw the PICC away. Your health care provider will need to inspect it.  Your PICC was tugged or pulled and has partially come out. Do not  push the PICC back in.  There is any type of drainage, redness, or swelling where the PICC enters the skin.  You cannot flush the PICC, it is difficult to flush, or the PICC leaks around the insertion site when it is flushed.  You hear a "flushing" sound when the PICC is flushed.  You have pain, discomfort, or numbness in your arm, shoulder, or jaw on the same side as the PICC.  You feel your heart "racing" or skipping beats.  You notice a hole or tear in the PICC.  You develop chills or a fever. MAKE SURE YOU:   Understand these instructions.  Will watch your condition.  Will get help right away if you are not doing well or get worse. Document Released: 08/31/2002 Document Revised: 12/15/2012 Document Reviewed: 11/01/2012 [employer] Patient Information 2014 [employer].  

## 2013-05-10 ENCOUNTER — Ambulatory Visit: Payer: BC Managed Care – PPO | Attending: Internal Medicine | Admitting: Physical Therapy

## 2013-05-10 DIAGNOSIS — R29898 Other symptoms and signs involving the musculoskeletal system: Secondary | ICD-10-CM | POA: Diagnosis not present

## 2013-05-10 DIAGNOSIS — R5381 Other malaise: Secondary | ICD-10-CM | POA: Diagnosis not present

## 2013-05-10 DIAGNOSIS — IMO0001 Reserved for inherently not codable concepts without codable children: Secondary | ICD-10-CM | POA: Insufficient documentation

## 2013-05-10 DIAGNOSIS — C3490 Malignant neoplasm of unspecified part of unspecified bronchus or lung: Secondary | ICD-10-CM | POA: Diagnosis not present

## 2013-05-11 ENCOUNTER — Ambulatory Visit (HOSPITAL_BASED_OUTPATIENT_CLINIC_OR_DEPARTMENT_OTHER): Payer: BC Managed Care – PPO

## 2013-05-11 VITALS — BP 117/64 | HR 98 | Temp 98.1°F

## 2013-05-11 DIAGNOSIS — Z452 Encounter for adjustment and management of vascular access device: Secondary | ICD-10-CM

## 2013-05-11 DIAGNOSIS — C3490 Malignant neoplasm of unspecified part of unspecified bronchus or lung: Secondary | ICD-10-CM

## 2013-05-11 MED ORDER — HEPARIN SOD (PORK) LOCK FLUSH 100 UNIT/ML IV SOLN
500.0000 [IU] | Freq: Once | INTRAVENOUS | Status: AC
  Administered 2013-05-11: 250 [IU] via INTRAVENOUS
  Filled 2013-05-11: qty 5

## 2013-05-11 MED ORDER — SODIUM CHLORIDE 0.9 % IJ SOLN
10.0000 mL | INTRAMUSCULAR | Status: DC | PRN
  Administered 2013-05-11: 10 mL via INTRAVENOUS
  Filled 2013-05-11: qty 10

## 2013-05-11 NOTE — Progress Notes (Signed)
Single lumen PICC line flushed with 250 units heparin and NS. Blood return noted. Dressing changed using sterile technique. Antimicrobial patch in place and covered with sorba view. Pt tolerated procedure well.

## 2013-05-13 ENCOUNTER — Telehealth: Payer: Self-pay | Admitting: Critical Care Medicine

## 2013-05-13 ENCOUNTER — Ambulatory Visit (HOSPITAL_BASED_OUTPATIENT_CLINIC_OR_DEPARTMENT_OTHER): Payer: BC Managed Care – PPO

## 2013-05-13 VITALS — BP 121/66 | HR 102 | Temp 98.3°F

## 2013-05-13 DIAGNOSIS — Z452 Encounter for adjustment and management of vascular access device: Secondary | ICD-10-CM

## 2013-05-13 DIAGNOSIS — C3400 Malignant neoplasm of unspecified main bronchus: Secondary | ICD-10-CM

## 2013-05-13 MED ORDER — HEPARIN SOD (PORK) LOCK FLUSH 100 UNIT/ML IV SOLN
500.0000 [IU] | Freq: Once | INTRAVENOUS | Status: AC
  Administered 2013-05-13: 250 [IU] via INTRAVENOUS
  Filled 2013-05-13: qty 5

## 2013-05-13 MED ORDER — HYDROCODONE-HOMATROPINE 5-1.5 MG/5ML PO SYRP: 5.0000 mL | ORAL_SOLUTION | Freq: Four times a day (QID) | ORAL | Status: DC | PRN

## 2013-05-13 MED ORDER — SODIUM CHLORIDE 0.9 % IJ SOLN
10.0000 mL | INTRAMUSCULAR | Status: DC | PRN
  Administered 2013-05-13: 10 mL via INTRAVENOUS
  Filled 2013-05-13: qty 10

## 2013-05-13 NOTE — Telephone Encounter (Signed)
Yes.  OK to refill. 

## 2013-05-13 NOTE — Telephone Encounter (Signed)
Printed rx for BQ to sign. This has been given to him. Caryl Pina will place up front once it's signed. Pt is aware that this will be placed up front for her to pick up.

## 2013-05-13 NOTE — Telephone Encounter (Signed)
Pt is aware that we will have to get approval from another MD since PW is not here today.  Last OV with PW 04/08/13 Pending OV with PW 06/06/13 Last fill of Hydromet 04/08/13 #267mL  BQ - please advise in PW's absence. Thanks.

## 2013-05-16 ENCOUNTER — Other Ambulatory Visit: Payer: Self-pay

## 2013-05-16 ENCOUNTER — Inpatient Hospital Stay (HOSPITAL_COMMUNITY)
Admission: EM | Admit: 2013-05-16 | Discharge: 2013-05-24 | DRG: 299 | Disposition: A | Discharge status: Skilled Nursing Facility | Payer: BC Managed Care – PPO | Attending: Internal Medicine | Admitting: Internal Medicine

## 2013-05-16 ENCOUNTER — Telehealth: Payer: Self-pay | Admitting: *Deleted

## 2013-05-16 ENCOUNTER — Other Ambulatory Visit: Payer: Self-pay | Admitting: *Deleted

## 2013-05-16 ENCOUNTER — Emergency Department (HOSPITAL_COMMUNITY): Payer: BC Managed Care – PPO

## 2013-05-16 ENCOUNTER — Encounter (HOSPITAL_COMMUNITY): Payer: Self-pay | Admitting: Emergency Medicine

## 2013-05-16 DIAGNOSIS — C3491 Malignant neoplasm of unspecified part of right bronchus or lung: Secondary | ICD-10-CM | POA: Diagnosis present

## 2013-05-16 DIAGNOSIS — D696 Thrombocytopenia, unspecified: Secondary | ICD-10-CM | POA: Diagnosis present

## 2013-05-16 DIAGNOSIS — Z79899 Other long term (current) drug therapy: Secondary | ICD-10-CM

## 2013-05-16 DIAGNOSIS — Z6834 Body mass index (BMI) 34.0-34.9, adult: Secondary | ICD-10-CM

## 2013-05-16 DIAGNOSIS — C3490 Malignant neoplasm of unspecified part of unspecified bronchus or lung: Secondary | ICD-10-CM | POA: Diagnosis present

## 2013-05-16 DIAGNOSIS — J441 Chronic obstructive pulmonary disease with (acute) exacerbation: Secondary | ICD-10-CM | POA: Diagnosis present

## 2013-05-16 DIAGNOSIS — S72002A Fracture of unspecified part of neck of left femur, initial encounter for closed fracture: Secondary | ICD-10-CM

## 2013-05-16 DIAGNOSIS — E43 Unspecified severe protein-calorie malnutrition: Secondary | ICD-10-CM | POA: Insufficient documentation

## 2013-05-16 DIAGNOSIS — R7989 Other specified abnormal findings of blood chemistry: Secondary | ICD-10-CM | POA: Diagnosis present

## 2013-05-16 DIAGNOSIS — J189 Pneumonia, unspecified organism: Secondary | ICD-10-CM

## 2013-05-16 DIAGNOSIS — I82629 Acute embolism and thrombosis of deep veins of unspecified upper extremity: Principal | ICD-10-CM | POA: Diagnosis present

## 2013-05-16 DIAGNOSIS — S72009A Fracture of unspecified part of neck of unspecified femur, initial encounter for closed fracture: Secondary | ICD-10-CM

## 2013-05-16 DIAGNOSIS — F329 Major depressive disorder, single episode, unspecified: Secondary | ICD-10-CM | POA: Diagnosis present

## 2013-05-16 DIAGNOSIS — C7949 Secondary malignant neoplasm of other parts of nervous system: Secondary | ICD-10-CM

## 2013-05-16 DIAGNOSIS — M7989 Other specified soft tissue disorders: Secondary | ICD-10-CM

## 2013-05-16 DIAGNOSIS — R748 Abnormal levels of other serum enzymes: Secondary | ICD-10-CM | POA: Diagnosis present

## 2013-05-16 DIAGNOSIS — I82509 Chronic embolism and thrombosis of unspecified deep veins of unspecified lower extremity: Secondary | ICD-10-CM | POA: Diagnosis present

## 2013-05-16 DIAGNOSIS — J309 Allergic rhinitis, unspecified: Secondary | ICD-10-CM

## 2013-05-16 DIAGNOSIS — W19XXXA Unspecified fall, initial encounter: Secondary | ICD-10-CM | POA: Diagnosis present

## 2013-05-16 DIAGNOSIS — R799 Abnormal finding of blood chemistry, unspecified: Secondary | ICD-10-CM

## 2013-05-16 DIAGNOSIS — Y92009 Unspecified place in unspecified non-institutional (private) residence as the place of occurrence of the external cause: Secondary | ICD-10-CM

## 2013-05-16 DIAGNOSIS — F411 Generalized anxiety disorder: Secondary | ICD-10-CM | POA: Diagnosis present

## 2013-05-16 DIAGNOSIS — Z8249 Family history of ischemic heart disease and other diseases of the circulatory system: Secondary | ICD-10-CM

## 2013-05-16 DIAGNOSIS — M545 Low back pain, unspecified: Secondary | ICD-10-CM

## 2013-05-16 DIAGNOSIS — J9600 Acute respiratory failure, unspecified whether with hypoxia or hypercapnia: Secondary | ICD-10-CM | POA: Diagnosis present

## 2013-05-16 DIAGNOSIS — R778 Other specified abnormalities of plasma proteins: Secondary | ICD-10-CM | POA: Diagnosis present

## 2013-05-16 DIAGNOSIS — K219 Gastro-esophageal reflux disease without esophagitis: Secondary | ICD-10-CM | POA: Diagnosis present

## 2013-05-16 DIAGNOSIS — I82409 Acute embolism and thrombosis of unspecified deep veins of unspecified lower extremity: Secondary | ICD-10-CM | POA: Diagnosis present

## 2013-05-16 DIAGNOSIS — Z87891 Personal history of nicotine dependence: Secondary | ICD-10-CM

## 2013-05-16 DIAGNOSIS — G609 Hereditary and idiopathic neuropathy, unspecified: Secondary | ICD-10-CM | POA: Diagnosis present

## 2013-05-16 DIAGNOSIS — Z923 Personal history of irradiation: Secondary | ICD-10-CM

## 2013-05-16 DIAGNOSIS — Z9221 Personal history of antineoplastic chemotherapy: Secondary | ICD-10-CM

## 2013-05-16 DIAGNOSIS — C7931 Secondary malignant neoplasm of brain: Secondary | ICD-10-CM | POA: Diagnosis present

## 2013-05-16 DIAGNOSIS — Z801 Family history of malignant neoplasm of trachea, bronchus and lung: Secondary | ICD-10-CM

## 2013-05-16 DIAGNOSIS — O2230 Deep phlebothrombosis in pregnancy, unspecified trimester: Secondary | ICD-10-CM | POA: Insufficient documentation

## 2013-05-16 DIAGNOSIS — Z7901 Long term (current) use of anticoagulants: Secondary | ICD-10-CM

## 2013-05-16 DIAGNOSIS — I10 Essential (primary) hypertension: Secondary | ICD-10-CM | POA: Diagnosis present

## 2013-05-16 DIAGNOSIS — D638 Anemia in other chronic diseases classified elsewhere: Secondary | ICD-10-CM | POA: Diagnosis present

## 2013-05-16 DIAGNOSIS — I6340 Cerebral infarction due to embolism of unspecified cerebral artery: Secondary | ICD-10-CM

## 2013-05-16 DIAGNOSIS — D72829 Elevated white blood cell count, unspecified: Secondary | ICD-10-CM | POA: Diagnosis present

## 2013-05-16 DIAGNOSIS — F3289 Other specified depressive episodes: Secondary | ICD-10-CM | POA: Diagnosis present

## 2013-05-16 LAB — URINALYSIS, ROUTINE W REFLEX MICROSCOPIC
Bilirubin Urine: NEGATIVE
Glucose, UA: NEGATIVE mg/dL
Ketones, ur: NEGATIVE mg/dL
Nitrite: POSITIVE — AB
Protein, ur: 30 mg/dL — AB
SPECIFIC GRAVITY, URINE: 1.017 (ref 1.005–1.030)
Urobilinogen, UA: 1 mg/dL (ref 0.0–1.0)
pH: 6 (ref 5.0–8.0)

## 2013-05-16 LAB — COMPREHENSIVE METABOLIC PANEL
ALK PHOS: 117 U/L (ref 39–117)
ALT: 12 U/L (ref 0–35)
AST: 19 U/L (ref 0–37)
Albumin: 2.1 g/dL — ABNORMAL LOW (ref 3.5–5.2)
BUN: 13 mg/dL (ref 6–23)
CO2: 23 mEq/L (ref 19–32)
Calcium: 9.9 mg/dL (ref 8.4–10.5)
Chloride: 94 mEq/L — ABNORMAL LOW (ref 96–112)
Creatinine, Ser: 0.92 mg/dL (ref 0.50–1.10)
GFR calc Af Amer: 76 mL/min — ABNORMAL LOW (ref >90)
GFR calc non Af Amer: 66 mL/min — ABNORMAL LOW (ref >90)
Glucose, Bld: 103 mg/dL — ABNORMAL HIGH (ref 70–99)
Potassium: 3.9 mEq/L (ref 3.7–5.3)
Sodium: 133 mEq/L — ABNORMAL LOW (ref 137–147)
Total Bilirubin: 0.4 mg/dL (ref 0.3–1.2)
Total Protein: 6.6 g/dL (ref 6.0–8.3)

## 2013-05-16 LAB — I-STAT TROPONIN, ED: Troponin i, poc: 0.54 ng/mL (ref 0.00–0.08)

## 2013-05-16 LAB — APTT: APTT: 40 s — AB (ref 24–37)

## 2013-05-16 LAB — CBC
HEMATOCRIT: 31.4 % — AB (ref 36.0–46.0)
HEMOGLOBIN: 10.2 g/dL — AB (ref 12.0–15.0)
MCH: 30.2 pg (ref 26.0–34.0)
MCHC: 32.5 g/dL (ref 30.0–36.0)
MCV: 92.9 fL (ref 78.0–100.0)
Platelets: 119 10*3/uL — ABNORMAL LOW (ref 150–400)
RBC: 3.38 MIL/uL — ABNORMAL LOW (ref 3.87–5.11)
RDW: 19.7 % — AB (ref 11.5–15.5)
WBC: 14.2 10*3/uL — AB (ref 4.0–10.5)

## 2013-05-16 LAB — URINE MICROSCOPIC-ADD ON

## 2013-05-16 LAB — PROTIME-INR
INR: 1.28 (ref 0.00–1.49)
PROTHROMBIN TIME: 15.7 s — AB (ref 11.6–15.2)

## 2013-05-16 MED ORDER — OXYCODONE HCL 5 MG PO TABS
5.0000 mg | ORAL_TABLET | ORAL | Status: DC | PRN
  Administered 2013-05-19 – 2013-05-22 (×8): 5 mg via ORAL
  Filled 2013-05-16 (×8): qty 1

## 2013-05-16 MED ORDER — VITAMIN D3 25 MCG (1000 UNIT) PO TABS
1000.0000 [IU] | ORAL_TABLET | Freq: Every day | ORAL | Status: DC
  Administered 2013-05-17 – 2013-05-24 (×8): 1000 [IU] via ORAL
  Filled 2013-05-16 (×8): qty 1

## 2013-05-16 MED ORDER — PROCHLORPERAZINE MALEATE 10 MG PO TABS
10.0000 mg | ORAL_TABLET | Freq: Four times a day (QID) | ORAL | Status: DC | PRN
  Filled 2013-05-16: qty 1

## 2013-05-16 MED ORDER — DEXTROSE 5 % IV SOLN
1.0000 g | INTRAVENOUS | Status: DC
  Administered 2013-05-16 – 2013-05-22 (×7): 1 g via INTRAVENOUS
  Administered 2013-05-23: 21:00:00 via INTRAVENOUS
  Filled 2013-05-16 (×9): qty 10

## 2013-05-16 MED ORDER — LORAZEPAM 0.5 MG PO TABS
0.5000 mg | ORAL_TABLET | Freq: Four times a day (QID) | ORAL | Status: DC | PRN
  Administered 2013-05-17 – 2013-05-19 (×3): 0.5 mg via ORAL
  Filled 2013-05-16 (×3): qty 1

## 2013-05-16 MED ORDER — HEPARIN (PORCINE) IN NACL 100-0.45 UNIT/ML-% IJ SOLN
1200.0000 [IU]/h | INTRAMUSCULAR | Status: DC
  Administered 2013-05-16: 1200 [IU]/h via INTRAVENOUS
  Filled 2013-05-16: qty 250

## 2013-05-16 MED ORDER — ALUM & MAG HYDROXIDE-SIMETH 200-200-20 MG/5ML PO SUSP
30.0000 mL | Freq: Four times a day (QID) | ORAL | Status: DC | PRN
  Administered 2013-05-18 – 2013-05-19 (×2): 30 mL via ORAL
  Filled 2013-05-16 (×2): qty 30

## 2013-05-16 MED ORDER — SODIUM CHLORIDE 0.9 % IV BOLUS (SEPSIS)
500.0000 mL | Freq: Once | INTRAVENOUS | Status: AC
  Administered 2013-05-16: 500 mL via INTRAVENOUS

## 2013-05-16 MED ORDER — ACETAMINOPHEN 325 MG PO TABS
650.0000 mg | ORAL_TABLET | Freq: Four times a day (QID) | ORAL | Status: DC | PRN
Start: 2013-05-16 — End: 2013-05-24
  Administered 2013-05-18 – 2013-05-20 (×3): 650 mg via ORAL
  Filled 2013-05-16 (×3): qty 2

## 2013-05-16 MED ORDER — ALBUTEROL SULFATE (2.5 MG/3ML) 0.083% IN NEBU
2.5000 mg | INHALATION_SOLUTION | RESPIRATORY_TRACT | Status: DC | PRN
  Administered 2013-05-22: 2.5 mg via RESPIRATORY_TRACT
  Filled 2013-05-16: qty 3

## 2013-05-16 MED ORDER — NEBIVOLOL HCL 10 MG PO TABS
10.0000 mg | ORAL_TABLET | Freq: Every day | ORAL | Status: DC
  Administered 2013-05-17 – 2013-05-24 (×8): 10 mg via ORAL
  Filled 2013-05-16 (×8): qty 1

## 2013-05-16 MED ORDER — ASPIRIN 325 MG PO TABS
325.0000 mg | ORAL_TABLET | Freq: Once | ORAL | Status: AC
  Administered 2013-05-16: 325 mg via ORAL
  Filled 2013-05-16: qty 1

## 2013-05-16 MED ORDER — SODIUM CHLORIDE 0.9 % IV SOLN: INTRAVENOUS | Status: DC

## 2013-05-16 MED ORDER — HEPARIN BOLUS VIA INFUSION
4000.0000 [IU] | Freq: Once | INTRAVENOUS | Status: AC
  Administered 2013-05-16: 4000 [IU] via INTRAVENOUS
  Filled 2013-05-16: qty 4000

## 2013-05-16 MED ORDER — BUPROPION HCL ER (SMOKING DET) 150 MG PO TB12
150.0000 mg | ORAL_TABLET | Freq: Every day | ORAL | Status: DC
  Administered 2013-05-17 – 2013-05-23 (×7): 150 mg via ORAL
  Filled 2013-05-16 (×8): qty 1

## 2013-05-16 MED ORDER — BUDESONIDE-FORMOTEROL FUMARATE 160-4.5 MCG/ACT IN AERO
2.0000 | INHALATION_SPRAY | Freq: Two times a day (BID) | RESPIRATORY_TRACT | Status: DC
  Administered 2013-05-17 – 2013-05-24 (×16): 2 via RESPIRATORY_TRACT
  Filled 2013-05-16: qty 6

## 2013-05-16 MED ORDER — SODIUM CHLORIDE 0.9 % IJ SOLN
3.0000 mL | Freq: Two times a day (BID) | INTRAMUSCULAR | Status: DC
  Administered 2013-05-19 – 2013-05-24 (×7): 3 mL via INTRAVENOUS

## 2013-05-16 MED ORDER — ACETAMINOPHEN 650 MG RE SUPP: 650.0000 mg | Freq: Four times a day (QID) | RECTAL | Status: DC | PRN

## 2013-05-16 MED ORDER — HYDROCODONE-HOMATROPINE 5-1.5 MG/5ML PO SYRP
5.0000 mL | ORAL_SOLUTION | Freq: Four times a day (QID) | ORAL | Status: DC | PRN
  Administered 2013-05-16 – 2013-05-19 (×3): 5 mL via ORAL
  Filled 2013-05-16 (×3): qty 5

## 2013-05-16 MED ORDER — TIOTROPIUM BROMIDE MONOHYDRATE 18 MCG IN CAPS
18.0000 ug | ORAL_CAPSULE | Freq: Every day | RESPIRATORY_TRACT | Status: DC
  Administered 2013-05-19 – 2013-05-24 (×6): 18 ug via RESPIRATORY_TRACT
  Filled 2013-05-16 (×4): qty 5

## 2013-05-16 NOTE — Progress Notes (Signed)
*  PRELIMINARY RESULTS* Vascular Ultrasound Right upper extremity venous duplex has been completed.  Preliminary findings: Evidence of DVT involving the right subclavian, axillary, brachial, radial, and ulnar veins. Superficial thrombosis involving the right basilic and cephalic veins, and multiple superficial branches in forearm.   Landry Mellow, RDMS, RVT  05/16/2013, 4:47 PM

## 2013-05-16 NOTE — Telephone Encounter (Signed)
Received call from [facility], she is calling EMS to take pt to the ED.  SLJ

## 2013-05-16 NOTE — Progress Notes (Signed)
ANTICOAGULATION CONSULT NOTE - Initial Consult  Pharmacy Consult for Heparin Indication: DVT  No Known Allergies  Patient Measurements:   Heparin Dosing Weight: 73.5kg (using previous weight 93.5kg)  Vital Signs: Temp: 97.3 F (36.3 C) (03/09 1406) Temp src: Oral (03/09 1406) BP: 119/67 mmHg (03/09 1553) Pulse Rate: 99 (03/09 1553)  Labs: No results found for this basename: HGB, HCT, PLT, APTT, LABPROT, INR, HEPARINUNFRC, CREATININE, CKTOTAL, CKMB, TROPONINI,  in the last 72 hours  The CrCl is unknown because both a height and weight (above a minimum accepted value) are required for this calculation.   Medical History: Past Medical History  Diagnosis Date  . Anxiety   . Depression   . Peripheral neuropathy     feet: unknown etiol  . Elevated glucose   . Allergic rhinitis   . GERD (gastroesophageal reflux disease)   . Complication of anesthesia w/tubal    got anest. "went crazy"   . 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  Pharmacy consulted to start heparin.  Last dose of lovenox 150mg  daily 3/6 (Friday) per patient (per pt, out of stock at outpatient pharmacy).     Weight = 93.6kg per RN Hep dosing weight = 73.5kg Stat baseline anticoagulation labs ordered and in process.    Goal of Therapy:  Heparin level 0.3-0.7 units/ml Monitor platelets by anticoagulation protocol: Yes   Plan:  Heparin bolus 4000 units, then start heparin drip at 1200 units/hr (12 ml/hr).  F/u CBC, renal function, baseline PT/INR.  Check heparin level in 6 hours.    Ralene Bathe, PharmD, BCPS 05/16/2013, 5:40 PM  Pager: [phone]

## 2013-05-16 NOTE — H&P (Signed)
Patient Demographics  Meagan Garcia, is a 63 y.o. female  MRN: [id number]   DOB - [birth year]  Admit Date - 05/16/2013  Outpatient Primary MD for the patient is [name], MD   With History of -  Past Medical History  Diagnosis Date  . Anxiety   . Depression   . Peripheral neuropathy     feet: unknown etiol  . Elevated glucose   . Allergic rhinitis   . GERD (gastroesophageal reflux disease)   . Complication of anesthesia w/tubal    got anest. "went crazy"   . Non-small cell lung cancer       Past Surgical History  Procedure Laterality Date  . Abdominal hysterectomy      partial  . Tubal reversal    . Tubal ligation      x2  . Fibroid tumors    . Knee arthroscopy    . Foot surgery      cysts from both feet  . Video bronchoscopy Bilateral 02/21/2013    Procedure: VIDEO BRONCHOSCOPY WITHOUT FLUORO;  Surgeon: Doree Fudge, MD;  Location: [facility];  Service: Cardiopulmonary;  Laterality: Bilateral;    in for   Chief Complaint  Patient presents with  . Dizziness     HPI  Meagan Garcia  is a 63 y.o. female, with known history of lung cancer, followed by Dr. Earlie Server, status post chemotherapy and radiation, patient presents today for multiple complaints, mainly for generalized weakness, status post fall while she was undertaken with her walker, with left hip pain, and right upper extremity swelling and tenderness, patient reports that PICC line was inserted in January, today she is known to have history of lower extremity DVT she has been on Lovenox, reports she has been out of Lovenox for the last 3 days, patient right upper extremity ultrasound showing evidence of acute DVT, as well patient reports left hip pain, patient x-ray showing a left hip fracture, nondisplaced, ED  discussed with orthopedic on call, who recommended weightbearing as tolerated, this is not a surgical fracture. As well patient troponins were found mildly elevated, she denies any chest pain, her EKG did not show any acute findings, she was given 325 mg of aspirin, patient has leukocytosis at 14,000, chest x-ray does not show any opacity or infiltrate, urine analysis is still pending.   Review of Systems    In addition to the HPI above,  No Fever-chills, but complains of fatigue and weakness, decreased oral intake No Headache, No changes with Vision or hearing, No problems swallowing food or Liquids, No Chest pain, Cough or Shortness of Breath, No Abdominal pain, No Nausea or Vommitting, Bowel movements are regular, No Blood in stool or Urine, No dysuria, No new skin rashes or bruises, No new joints pains-aches,  has right upper extremity swelling No new weakness, tingling, numbness in any extremity, No recent weight gain or loss, No polyuria, polydypsia or polyphagia, No significant Mental Stressors.  A full 10 point Review of Systems was done, except as stated above, all other Review of Systems were negative.   Social History History  Substance Use Topics  . Smoking status: Former Smoker -- 1.00 packs/day for 40 years    Quit date: 02/19/1996  . Smokeless tobacco: Never Used  . Alcohol Use: No     Family History Family History  Problem Relation Age of Onset  . Hypertension Other   . Hypertension Mother   . Cancer Mother 22    lung ca  . Cancer Father     Prior to Admission medications   Medication Sig Start Date End Date Taking? Authorizing Provider  albuterol (PROVENTIL HFA;VENTOLIN HFA) 108 (90 BASE) MCG/ACT inhaler Inhale 2 puffs into the lungs every 6 (six) hours as needed for wheezing or shortness of breath. 04/06/13  Yes Cassandria Anger, MD  budesonide-formoterol (SYMBICORT) 160-4.5 MCG/ACT inhaler Inhale 2 puffs into the lungs 2 (two) times daily. 04/06/13   Yes Evie Lacks Plotnikov, MD  buPROPion (ZYBAN) 150 MG 12 hr tablet Take 150 mg by mouth daily.   Yes Historical Provider, MD  buPROPion (ZYBAN) 150 MG 12 hr tablet Take 150 mg by mouth daily.   Yes Historical Provider, MD  cholecalciferol (VITAMIN D) 1000 UNITS tablet Take 1 tablet (1,000 Units total) by mouth daily. 02/23/13 05/25/15 Yes Domenic Polite, MD  docusate sodium (COLACE) 100 MG capsule Take 200 mg by mouth daily.    Yes Historical Provider, MD  enoxaparin (LOVENOX) 150 MG/ML injection Inject 150 mg into the skin daily. 04/05/13  Yes Curt Bears, MD  fluticasone (FLONASE) 50 MCG/ACT nasal spray Place 2 sprays into the nose daily. 04/14/12  Yes Evie Lacks Plotnikov, MD  hyaluronate sodium (RADIAPLEXRX) GEL Apply 1 application topically 2 (two) times daily. Apply to chest area being treated with radiation after rad tx and bedtime when skin becomes itchy or irritation/erythema  And on weekedns as well 03/08/13  Yes Thea Silversmith, MD  LORazepam (ATIVAN) 1 MG tablet TAKE ONE TABLET BY MOUTH ONCE DAILY 12/23/12  Yes Evie Lacks Plotnikov, MD  LORazepam (ATIVAN) 1 MG tablet Take 1 mg by mouth at bedtime.   Yes Historical Provider, MD  nebivolol (BYSTOLIC) 10 MG tablet Take 1 tablet (10 mg total) by mouth daily. 04/06/13  Yes Cassandria Anger, MD  prochlorperazine (COMPAZINE) 10 MG tablet Take 1 tablet (10 mg total) by mouth every 6 (six) hours as needed for nausea or vomiting. 02/26/13  Yes Curt Bears, MD  tiotropium (SPIRIVA HANDIHALER) 18 MCG inhalation capsule Place 1 capsule (18 mcg total) into inhaler and inhale daily. 04/06/13  Yes Evie Lacks Plotnikov, MD  HYDROcodone-homatropine (HYCODAN) 5-1.5 MG/5ML syrup Take 5 mLs by mouth every 6 (six) hours as needed for cough. 05/13/13   Juanito Doom, MD    No Known Allergies  Physical Exam  Vitals  Blood pressure 121/66, pulse 101, temperature 97.3 F (36.3 C), temperature source Oral, resp. rate 20, height 5\' 3"  (1.6 m), weight  93.441 kg (206 lb), SpO2 100.00%.   1. General obese female lying in bed in NAD,   2. Normal affect and insight, Not Suicidal or Homicidal, Awake Alert, Oriented X 3.  3. No F.N deficits, ALL C.Nerves Intact, Strength 5/5 all 4 extremities, Sensation intact all 4 extremities, Plantars down going.  4. Ears and Eyes appear Normal, Conjunctivae clear, PERRLA. Moist Oral Mucosa.  5. Supple Neck, No JVD, No cervical lymphadenopathy appriciated, No Carotid Bruits.  6.  Symmetrical Chest wall movement, Good air movement bilaterally, CTAB.  7. RRR, No Gallops, Rubs or Murmurs, No Parasternal Heave.  8. Positive Bowel Sounds, Abdomen Soft, Non tender, No organomegaly appriciated,No rebound -guarding or rigidity.  9.  No Cyanosis, Normal Skin Turgor, No Skin Rash or Bruise.  10. Good muscle tone,  joints appear normal , no effusions, Normal ROM. Right upper extremity swelling no erythema. Has PICC line     Data Review  CBC  Recent Labs Lab 05/16/13 1710  WBC 14.2*  HGB 10.2*  HCT 31.4*  PLT 119*  MCV 92.9  MCH 30.2  MCHC 32.5  RDW 19.7*   ------------------------------------------------------------------------------------------------------------------  Chemistries   Recent Labs Lab 05/16/13 1710  NA 133*  K 3.9  CL 94*  CO2 23  GLUCOSE 103*  BUN 13  CREATININE 0.92  CALCIUM 9.9  AST 19  ALT 12  ALKPHOS 117  BILITOT 0.4   ------------------------------------------------------------------------------------------------------------------ estimated creatinine clearance is 69.7 ml/min (by C-G formula based on Cr of 0.92). ------------------------------------------------------------------------------------------------------------------ No results found for this basename: TSH, T4TOTAL, FREET3, T3FREE, THYROIDAB,  in the last 72 hours   Coagulation profile  Recent Labs Lab 05/16/13 1710  INR 1.28    ------------------------------------------------------------------------------------------------------------------- No results found for this basename: DDIMER,  in the last 72 hours -------------------------------------------------------------------------------------------------------------------  Cardiac Enzymes No results found for this basename: CK, CKMB, TROPONINI, MYOGLOBIN,  in the last 168 hours ------------------------------------------------------------------------------------------------------------------ No components found with this basename: POCBNP,    ---------------------------------------------------------------------------------------------------------------  Urinalysis    Component Value Date/Time   COLORURINE LT. YELLOW 10/29/2009 [place] 10/29/2009 1643   LABSPEC 1.010 10/29/2009 1643   PHURINE 5.5 10/29/2009 [place] 10/29/2009 [place] 10/29/2009 1643   UROBILINOGEN 0.2 10/29/2009 1643   NITRITE NEGATIVE 10/29/2009 [place] 10/29/2009 1643    ----------------------------------------------------------------------------------------------------------------  Imaging results:   Dg Chest 2 View  05/16/2013   CLINICAL DATA:  Hypertension. Smoking history. Cough. History of lung cancer.  EXAM: CHEST  2 VIEW  COMPARISON:  03/14/2013  FINDINGS: Right arm PICC has its tip in the SVC just above the entrance of the innominate vein. Pleural effusion seen previously as much smaller on the right. There is mild right base spine loss. Abnormal interstitial lung markings remain evident bilaterally. No new or worsening finding.  IMPRESSION: Right-sided pleural effusion is smaller than was seen in January, primarily sub pulmonic. Mild right base volume loss.  Abnormal interstitial lung markings diffusely.  Right arm PICC tip pulled back slightly, at the level of the SVC just above the innominate vein.   Electronically  Signed   By: Nelson Chimes M.D.   On: 05/16/2013 16:39   Dg Hip Complete Left  05/16/2013   CLINICAL DATA:  Acute left hip pain. The patient has been dizzy with a recent fall, according to Epic note.  EXAM: LEFT HIP - COMPLETE 2+ VIEW  COMPARISON:  None.  FINDINGS: There is slight irregularity of the left greater trochanter; acute nondisplaced fracture cannot be excluded. Femoral neck is intact. No pelvic fractures are evident. There is degenerative change lumbar spine.  IMPRESSION: Cannot exclude nondisplaced fracture left greater trochanter. Correlate clinically for tenderness in this region.   Electronically Signed   By: Rolla Flatten M.D.   On: 05/16/2013 16:33    My personal review of EKG: Rhythm NSR, Rate 101/min, QTc 520 , no Acute ST changes    Assessment & Plan  Principal Problem:  DVT (deep venous thrombosis) Active Problems:   Elevated troponin   DEPRESSION   HYPERTENSION   Non-small cell cancer of right lung   Hip fracture, left    1. right upper extremity DVT: Acute, provoked to do to a PICC line, patient is currently on IV heparin drip, she will be transitioned to subcutaneous Lovenox once more stable, will have a.m. team consult Dr. Earlie Server, discussed with Dr. Oneida Alar over the phone from vascular surgery service, who reports PICC line can be discontinued and no need to wait for 24 hours on anticoagulation. 2. Elevated troponin. Most likely related to the patient's extensive DVT, she denies any chest pain, has no EKG changes, the patient was given 325 mg of aspirin, will be monitored on telemetry, will continue to cycle cardiac enzymes and follow the trend. 3. Left hip fracture: Nondisplaced, ED physician discussed with orthopedic on call, this is nonsurgical fracture, he recommended physical therapy weight bearing as tolerated, a routine consult can be caused also in the morning. 4. Hypertension. Blood pressure acceptable 5. Lung cancer: Consult oncology service in the  morning 6. Depression: Continue with home meds   DVT Prophylaxis on heparin drip  AM Labs Ordered, also please review Full Orders  Family Communication: Admission, patients condition and plan of care including tests being ordered have been discussed with the patient and daughter who indicate understanding and agree with the plan and Code Status.  Code Status full  Likely DC to  home  Condition GUARDED    Time spent in minutes : 60 min    Rease Wence M.D on 05/16/2013 at [place]  [phone]

## 2013-05-16 NOTE — Telephone Encounter (Signed)
[place] [facility] is also full, per Dr Vista Mink, okay for pt to go to the [facility] ED so her exam can be expedited.  Called and left msg with instructions and asked for a call back to verify they received the information.  SLJ

## 2013-05-16 NOTE — ED Notes (Addendum)
Pt reports feeling dizzy since this morning, also reports weakness in right arm and left hip, sts recently started physical therapy and thinks that's what's causing hip weakness. Right arm is swollen from the wrist to elbow,no redness or warmth noted, pt does have PICC line in right arm

## 2013-05-16 NOTE — Progress Notes (Signed)
   CARE MANAGEMENT ED NOTE 05/16/2013  Patient:  Meagan Garcia, Meagan Garcia   Account Number:  [id number]  Date Initiated:  05/16/2013  Documentation initiated by:  Livia Snellen  Subjective/Objective Assessment:   Patient presents to ED with generalized weakness, left hip pain status post fall.     Subjective/Objective Assessment Detail:   Patient with pmhx of lung cancer.  Patient with picc line in right upper arm plced in January.     Action/Plan:   Patient on IV heparin for DVT right upper extremity, vascular consult, oncology and orthopedic consult.   Action/Plan Detail:   Patient to b admitted   Anticipated DC Date:       Status Recommendation to Physician:   Result of Recommendation:    Other ED Services  Consult Working Plan   In-house referral  Clinical Social Worker   DC Forensic scientist  Other    Choice offered to / List presented to:            Status of service:  Completed, signed off  ED Comments:   ED Comments Detail:  EDCM spoke to patient and her friend Meagan Garcia at bedside. Patient with stage III lung cancer.  Patient lves alone. As per patient's friend, patient's pastor and his wife come and check in on the patient.  They live in [place], patient lives in [place].  Patient;s family lives out of town.  Patient currently has a rented walker at home and a shower stool.  Patient is requesting her own walker, wheelchair, bedside commode and chair lift for home.  [facility] informed patient that she may have to pay out of pocket partially for chair lift.  Patient wearing oxygen in the ED, patient does NOT wear oxygen at home.  EDCM provided patient with list of home health agencies in [facility], list of private duty nursing services.  EDCM explained with home health, the patient may receive a visiting RN, PT, OT, aide and social worker if needed. Also informed patient she may have to pay out of pocket for the private duty nursing services.  Patient reports she is having  difficulty paying for her medications.  Patient currently getting prescriptions filled at [facility].  [facility] provided patient with a list of discounted pharmacies and website needymeds.org for medication assistance.  EDCM provided patient with phone number to [facility] for patient assistance program for lovenox.  Also provided patient with printed coupons for lovenox and provided patient with RX discount card.  All printed resources given to patient's friend Meagan Garcia per patient request.  Patient and friend thankful for resources.  [name] consulted EDSW to speak to patient.  No further EDCM needs at this time.

## 2013-05-16 NOTE — ED Notes (Signed)
Dr Mingo Amber aware of critical I stat Troponin.

## 2013-05-16 NOTE — Progress Notes (Signed)
Clinical Social Work Department BRIEF PSYCHOSOCIAL ASSESSMENT 05/16/2013  Patient:  Meagan Garcia, Meagan Garcia     Account Number:  [id number]     Admit date:  05/16/2013  Clinical Social Worker:  Luretha Rued  Date/Time:  05/16/2013 07:50 PM  Referred by:  CSW  Date Referred:  05/16/2013 Referred for  SNF Placement   Other Referral:   Interview type:  Patient Other interview type:   Family Friend at bedside    PSYCHOSOCIAL DATA Living Status:  HUSBAND Admitted from facility:   Level of care:   Primary support name:  Meagan Garcia Primary support relationship to patient:  SPOUSE Degree of support available:   High level of support as evidenced by family friend at bedside and per patient report.    CURRENT CONCERNS Current Concerns  Post-Acute Placement   Other Concerns:    SOCIAL WORK ASSESSMENT / PLAN CSW met with patient and family friend at bedside to complete this assessment.  Patient appears alert, oriented x4, cooperative, calm, good insight, and good judgment. Patient reports that she does not have a POA and her husband is primary support.  Patient reports that she living in the community independently and plan to return once medically stable.  CSW provided the patient and family friend with a [place] SNF list for review.  CM provided the family with private duty and home health list.   Assessment/plan status:  Psychosocial Support/Ongoing Assessment of Needs Other assessment/ plan:   Information/referral to community resources:   SNF    PATIENT'S/FAMILY'S RESPONSE TO PLAN OF CARE: Patient expressed appreciation for the support of the social work department and agrees to review the resouces for potential placement.       Chesley Noon, MSW, LCSWA, 05/16/2013, 8:07 PM Evening Clinical Social Worker [phone]

## 2013-05-16 NOTE — ED Notes (Signed)
Per EMS pt coming from home with c/o dizziness starting this morning. Pt denies pain. Hx of lung cancer.

## 2013-05-16 NOTE — Telephone Encounter (Signed)
Pt's friend Alma Friendly called on pt's behalf from pt's home stating that pt woke up this morning extremely dizzy and having trouble balancing.  She fell x 1.  No fever or chills.  She is drinking 2-16 oz bottles of water daily.  She has no appetite and it is hard for her to get around the house.  Per dr Vista Mink pt needs MRI of the brain STAT.  [facility] and [facility] are full today.  Asked Shameeka in scheduling to investigate [place] [facility].  SLJ

## 2013-05-16 NOTE — ED Notes (Signed)
IV Team at bedside to remove PICC line.

## 2013-05-16 NOTE — ED Provider Notes (Signed)
CSN: [id number]     Arrival date & time 05/16/13  1403 History   First MD Initiated Contact with Patient 05/16/13 1506     Chief Complaint  Patient presents with  . Dizziness     (Consider location/radiation/quality/duration/timing/severity/associated sxs/prior Treatment) Patient is a 63 y.o. female presenting with dizziness. The history is provided by the patient.  Dizziness Quality:  Lightheadedness Severity:  Moderate Onset quality:  Sudden Timing:  Constant Progression:  Unchanged Chronicity:  New Context: standing up   Relieved by:  Lying down Worsened by:  Standing up Ineffective treatments:  None tried Associated symptoms: nausea, palpitations (occasional, had a few episodes yesterday) and vomiting (3 times yesterday, post-tussive)   Associated symptoms: no blood in stool, no chest pain, no diarrhea, no shortness of breath and no vision changes     Past Medical History  Diagnosis Date  . Anxiety   . Depression   . Peripheral neuropathy     feet: unknown etiol  . Elevated glucose   . Allergic rhinitis   . GERD (gastroesophageal reflux disease)   . Complication of anesthesia w/tubal    got anest. "went crazy"   . Non-small cell lung cancer    Past Surgical History  Procedure Laterality Date  . Abdominal hysterectomy      partial  . Tubal reversal    . Tubal ligation      x2  . Fibroid tumors    . Knee arthroscopy    . Foot surgery      cysts from both feet  . Video bronchoscopy Bilateral 02/21/2013    Procedure: VIDEO BRONCHOSCOPY WITHOUT FLUORO;  Surgeon: Doree Fudge, MD;  Location: [facility];  Service: Cardiopulmonary;  Laterality: Bilateral;   Family History  Problem Relation Age of Onset  . Hypertension Other   . Hypertension Mother   . Cancer Mother 59    lung ca  . Cancer Father    History  Substance Use Topics  . Smoking status: Former Smoker -- 1.00 packs/day for 40 years    Quit date: 02/19/1996  . Smokeless tobacco: Never  Used  . Alcohol Use: No   OB History   Grav Para Term Preterm Abortions TAB SAB Ect Mult Living                 Review of Systems  Constitutional: Negative for fever.  Respiratory: Negative for shortness of breath.   Cardiovascular: Positive for palpitations (occasional, had a few episodes yesterday). Negative for chest pain.  Gastrointestinal: Positive for nausea and vomiting (3 times yesterday, post-tussive). Negative for diarrhea and blood in stool.  Neurological: Positive for dizziness.  All other systems reviewed and are negative.      Allergies  Review of patient's allergies indicates no known allergies.  Home Medications   Current Outpatient Rx  Name  Route  Sig  Dispense  Refill  . albuterol (PROVENTIL HFA;VENTOLIN HFA) 108 (90 BASE) MCG/ACT inhaler   Inhalation   Inhale 2 puffs into the lungs every 6 (six) hours as needed for wheezing or shortness of breath.   1 Inhaler   2   . budesonide-formoterol (SYMBICORT) 160-4.5 MCG/ACT inhaler   Inhalation   Inhale 2 puffs into the lungs 2 (two) times daily.   1 Inhaler   11   . buPROPion (ZYBAN) 150 MG 12 hr tablet   Oral   Take 150 mg by mouth daily.         Marland Kitchen buPROPion (ZYBAN) 150 MG 12  hr tablet   Oral   Take 150 mg by mouth daily.         . cholecalciferol (VITAMIN D) 1000 UNITS tablet   Oral   Take 1 tablet (1,000 Units total) by mouth daily.   30 tablet   0   . docusate sodium (COLACE) 100 MG capsule   Oral   Take 200 mg by mouth daily.          Marland Kitchen enoxaparin (LOVENOX) 150 MG/ML injection   Subcutaneous   Inject 150 mg into the skin daily.         . fluticasone (FLONASE) 50 MCG/ACT nasal spray   Nasal   Place 2 sprays into the nose daily.   48 g   3   . hyaluronate sodium (RADIAPLEXRX) GEL   Topical   Apply 1 application topically 2 (two) times daily. Apply to chest area being treated with radiation after rad tx and bedtime when skin becomes itchy or irritation/erythema  And on  weekedns as well         . LORazepam (ATIVAN) 1 MG tablet      TAKE ONE TABLET BY MOUTH ONCE DAILY         . LORazepam (ATIVAN) 1 MG tablet   Oral   Take 1 mg by mouth at bedtime.         . nebivolol (BYSTOLIC) 10 MG tablet   Oral   Take 1 tablet (10 mg total) by mouth daily.   30 tablet   11   . prochlorperazine (COMPAZINE) 10 MG tablet   Oral   Take 1 tablet (10 mg total) by mouth every 6 (six) hours as needed for nausea or vomiting.   60 tablet   0   . tiotropium (SPIRIVA HANDIHALER) 18 MCG inhalation capsule   Inhalation   Place 1 capsule (18 mcg total) into inhaler and inhale daily.   30 capsule   11   . HYDROcodone-homatropine (HYCODAN) 5-1.5 MG/5ML syrup   Oral   Take 5 mLs by mouth every 6 (six) hours as needed for cough.   240 mL   0    BP 104/58  Pulse 103  Temp(Src) 97.3 F (36.3 C) (Oral)  Resp 20  SpO2 96% Physical Exam  Nursing note and vitals reviewed. Constitutional: She is oriented to person, place, and time. She appears well-developed and well-nourished. No distress.  HENT:  Head: Normocephalic and atraumatic.  Eyes: EOM are normal. Pupils are equal, round, and reactive to light.  Neck: Normal range of motion. Neck supple.  Cardiovascular: Normal rate and regular rhythm.  Exam reveals no friction rub.   No murmur heard. Pulmonary/Chest: Effort normal and breath sounds normal. No respiratory distress. She has no wheezes. She has no rales.  Abdominal: Soft. She exhibits no distension. There is no tenderness. There is no rebound.  Musculoskeletal: Normal range of motion. She exhibits edema (R forearm, R arm - NVI distally, no pitting edema, normal R elbow ROM. R upper arm PICC in place).  Neurological: She is alert and oriented to person, place, and time.  Skin: She is not diaphoretic.    ED Course  Procedures (including critical care time) Labs Review Labs Reviewed  [place],  ED   Imaging Review No results found.   EKG Interpretation None     Angiocath insertion Performed by: Osvaldo Shipper  Consent: Verbal consent obtained. Risks and benefits: risks, benefits and  alternatives were discussed Time out: Immediately prior to procedure a "time out" was called to verify the correct patient, procedure, equipment, support staff and site/side marked as required.  Preparation: Patient was prepped and draped in the usual sterile fashion.  Vein Location: L AC  Yes Ultrasound Guided  Gauge: 20g  Normal blood return and flush without difficulty Patient tolerance: Patient tolerated the procedure well with no immediate complications.    Editor: Chapman Fitch (Cardiovascular Sonographer)      *PRELIMINARY RESULTS*  Vascular Ultrasound  Right upper extremity venous duplex has been completed. Preliminary findings: Evidence of DVT involving the right subclavian, axillary, brachial, radial, and ulnar veins. Superficial thrombosis involving the right basilic and cephalic veins, and multiple superficial branches in forearm.  Landry Mellow, RDMS, RVT  05/16/2013, 4:47 PM     MDM   Final diagnoses:  DVT (deep venous thrombosis)  DVT (deep vein thrombosis) in pregnancy  Elevated troponin  Hip fracture, left  Non-small cell cancer of right lung    54F here with dizziness, R arm swelling, L hip pain. Dizziness: Began today, constant wooziness. Better with lying still, worse with standing up, moving around. No SOB, CP. 3 post-tussive episodes of vomiting. Has chronic productive cough, no change in caliber. Stage 3b lung cancer, recently diagnosed within last 4 months - finished chemo and radiation >1 month ago, no current chemo. Denies blood in stool, CP, worsening SOB from baseline. Will check cardiac labs, urine, CXR. Mucus membranes tacky, concern for dehydration.  R arm swelling: Is on lovenox for previous DVT. Has R PICC in place. Off lovenox for 3 days, R  arm swelling began 3 days ago. R forearm with some mild swelling, R upper arm with some mild swelling. NVI distally. Will [place]. L hip pain: No falls. Started PT last week, thinks it's from this. Pain with ambulation, movement. Mostly muscular pain on exam. Will xray L hip. L hip with slight greater trochanter fracture. Ortho states this is usually treated as WBAT.  Positive [place] of R arm. Will start heparin. Elevated troponin, however already starting heparin for her DVT.    Osvaldo Shipper, MD 05/17/13 [phone]

## 2013-05-16 NOTE — ED Notes (Addendum)
No blood return from the PICC line, IV team paged,ptwants to wait for IV team to attempt to draw blood before letting me try peripheral stick.Dr Mingo Amber aware

## 2013-05-16 NOTE — ED Notes (Signed)
Patient unable to perform standing bp for orthostatics. RN Kara Dies and MD Mingo Amber notified

## 2013-05-17 ENCOUNTER — Ambulatory Visit: Payer: BC Managed Care – PPO

## 2013-05-17 ENCOUNTER — Ambulatory Visit: Payer: BC Managed Care – PPO | Admitting: Internal Medicine

## 2013-05-17 DIAGNOSIS — J9600 Acute respiratory failure, unspecified whether with hypoxia or hypercapnia: Secondary | ICD-10-CM

## 2013-05-17 DIAGNOSIS — J309 Allergic rhinitis, unspecified: Secondary | ICD-10-CM

## 2013-05-17 LAB — BASIC METABOLIC PANEL
BUN: 12 mg/dL (ref 6–23)
CALCIUM: 9.5 mg/dL (ref 8.4–10.5)
CO2: 22 mEq/L (ref 19–32)
CREATININE: 0.97 mg/dL (ref 0.50–1.10)
Chloride: 96 mEq/L (ref 96–112)
GFR calc Af Amer: 72 mL/min — ABNORMAL LOW (ref >90)
GFR, EST NON AFRICAN AMERICAN: 62 mL/min — AB (ref >90)
Glucose, Bld: 94 mg/dL (ref 70–99)
Potassium: 3.8 mEq/L (ref 3.7–5.3)
Sodium: 133 mEq/L — ABNORMAL LOW (ref 137–147)

## 2013-05-17 LAB — TROPONIN I
TROPONIN I: 0.71 ng/mL — AB (ref <0.30)
TROPONIN I: 1.15 ng/mL — AB (ref <0.30)
Troponin I: 0.84 ng/mL (ref <0.30)

## 2013-05-17 LAB — HEPARIN LEVEL (UNFRACTIONATED)
Heparin Unfractionated: 0.19 IU/mL — ABNORMAL LOW (ref 0.30–0.70)
Heparin Unfractionated: 0.3 [IU]/mL (ref 0.30–0.70)
Heparin Unfractionated: 0.31 IU/mL (ref 0.30–0.70)

## 2013-05-17 LAB — CBC
HCT: 29.2 % — ABNORMAL LOW (ref 36.0–46.0)
Hemoglobin: 9.6 g/dL — ABNORMAL LOW (ref 12.0–15.0)
MCH: 30.7 pg (ref 26.0–34.0)
MCHC: 32.9 g/dL (ref 30.0–36.0)
MCV: 93.3 fL (ref 78.0–100.0)
Platelets: 115 10*3/uL — ABNORMAL LOW (ref 150–400)
RBC: 3.13 MIL/uL — ABNORMAL LOW (ref 3.87–5.11)
RDW: 20 % — AB (ref 11.5–15.5)
WBC: 11.6 10*3/uL — AB (ref 4.0–10.5)

## 2013-05-17 MED ORDER — ALBUTEROL SULFATE (2.5 MG/3ML) 0.083% IN NEBU
2.5000 mg | INHALATION_SOLUTION | Freq: Four times a day (QID) | RESPIRATORY_TRACT | Status: DC
  Administered 2013-05-17 – 2013-05-18 (×4): 2.5 mg via RESPIRATORY_TRACT
  Filled 2013-05-17 (×4): qty 3

## 2013-05-17 MED ORDER — METHYLPREDNISOLONE SODIUM SUCC 40 MG IJ SOLR
40.0000 mg | Freq: Two times a day (BID) | INTRAMUSCULAR | Status: DC
  Administered 2013-05-17 – 2013-05-18 (×3): 40 mg via INTRAVENOUS
  Filled 2013-05-17 (×4): qty 1

## 2013-05-17 MED ORDER — HEPARIN (PORCINE) IN NACL 100-0.45 UNIT/ML-% IJ SOLN
1400.0000 [IU]/h | INTRAMUSCULAR | Status: DC
  Filled 2013-05-17 (×3): qty 250

## 2013-05-17 MED ORDER — DEXTROSE 5 % IV SOLN
500.0000 mg | INTRAVENOUS | Status: DC
  Administered 2013-05-17 – 2013-05-22 (×6): 500 mg via INTRAVENOUS
  Filled 2013-05-17 (×9): qty 500

## 2013-05-17 MED ORDER — ENSURE COMPLETE PO LIQD
237.0000 mL | ORAL | Status: DC
  Administered 2013-05-17 – 2013-05-20 (×4): 237 mL via ORAL

## 2013-05-17 MED ORDER — HEPARIN (PORCINE) IN NACL 100-0.45 UNIT/ML-% IJ SOLN
1450.0000 [IU]/h | INTRAMUSCULAR | Status: DC
  Administered 2013-05-18: 1450 [IU]/h via INTRAVENOUS
  Filled 2013-05-17 (×2): qty 250

## 2013-05-17 NOTE — Progress Notes (Addendum)
ANTICOAGULATION CONSULT NOTE - Follow Up  Pharmacy Consult for Heparin Indication: DVT  No Known Allergies  Patient Measurements: Height: 5\' 3"  (160 cm) Weight: 196 lb 10.4 oz (89.2 kg) IBW/kg (Calculated) : 52.4 Heparin Dosing Weight: 72 kg  Vital Signs: Temp: 98.5 F (36.9 C) (03/10 0409) Temp src: Oral (03/10 0409) BP: 118/61 mmHg (03/10 0409) Pulse Rate: 102 (03/10 0409)  Labs:  Recent Labs  05/16/13 1710 05/16/13 2330 05/17/13 0030 05/17/13 0345 05/17/13 0840 05/17/13 1035  HGB 10.2*  --   --  9.6*  --   --   HCT 31.4*  --   --  29.2*  --   --   PLT 119*  --   --  115*  --   --   APTT 40*  --   --   --   --   --   LABPROT 15.7*  --   --   --   --   --   INR 1.28  --   --   --   --   --   HEPARINUNFRC  --   --  0.19*  --  0.31  --   CREATININE 0.92  --   --  0.97  --   --   TROPONINI  --  1.15*  --  0.84*  --  0.71*    Estimated Creatinine Clearance: 64.5 ml/min (by C-G formula based on Cr of 0.97).   Medical History: Past Medical History  Diagnosis Date  . Anxiety   . Depression   . Peripheral neuropathy     feet: unknown etiol  . Elevated glucose   . Allergic rhinitis   . GERD (gastroesophageal reflux disease)   . Complication of anesthesia w/tubal    got anest. "went crazy"   . 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  Pharmacy consulted to start heparin.  Last dose of lovenox 150mg  daily 3/6 (Friday) per patient (per pt, out of stock at outpatient pharmacy).     Heparin level 0.31, therapeutic with infusion at 1400 units/hr.  Hgb 9.6, plts 115K  Troponin elevated  Goal of Therapy:  Heparin level 0.3-0.7 units/ml Monitor platelets by anticoagulation protocol: Yes   Plan:  1.  Continue heparin at 1400 units/hr. 2.  Recheck heparin level at 1400 to confirm  rate. 3.  Daily CBC and HL while on heparin.  Hershal Coria, PharmD, BCPS Pager: [phone] 05/17/2013 11:51 AM   Addendum: 05/17/2013 2:51 PM Repeat heparin level 0.30 Plan: increase heparin rate slightly to 1450 units/hr since heparin remains at lowest end of therapeutic range.  F/u HL in AM.  Hershal Coria, PharmD, BCPS Pager: [phone] 05/17/2013 2:53 PM

## 2013-05-17 NOTE — Progress Notes (Signed)
ANTICOAGULATION CONSULT NOTE - Follow Up Consult  Pharmacy Consult for Heparin Indication: DVT  No Known Allergies  Patient Measurements: Height: 5\' 3"  (160 cm) Weight: 196 lb 10.4 oz (89.2 kg) IBW/kg (Calculated) : 52.4 Heparin Dosing Weight:   Vital Signs: Temp: 98.5 F (36.9 C) (03/10 0409) Temp src: Oral (03/10 0409) BP: 118/61 mmHg (03/10 0409) Pulse Rate: 102 (03/10 0409)  Labs:  Recent Labs  05/16/13 1710 05/16/13 2330 05/17/13 0030 05/17/13 0345  HGB 10.2*  --   --  9.6*  HCT 31.4*  --   --  29.2*  PLT 119*  --   --  115*  APTT 40*  --   --   --   LABPROT 15.7*  --   --   --   INR 1.28  --   --   --   HEPARINUNFRC  --   --  0.19*  --   CREATININE 0.92  --   --  0.97  TROPONINI  --  1.15*  --  0.84*    Estimated Creatinine Clearance: 64.5 ml/min (by C-G formula based on Cr of 0.97).   Medications:  Infusions:  . sodium chloride 50 mL/hr at 05/16/13 2245  . heparin 1,400 Units/hr (05/17/13 0100)    Assessment: Patient with low heparin level.  No issues per RN.  Goal of Therapy:  Heparin level 0.3-0.7 units/ml Monitor platelets by anticoagulation protocol: Yes   Plan:  Increase heparin to 1400 units/hr, recheck level at 0800.   Tyler Deis, Shea Stakes Crowford 05/17/2013,6:04 AM

## 2013-05-17 NOTE — Progress Notes (Signed)
Patient ID: Meagan Garcia, female   DOB: 1950-08-29, 63 y.o.   MRN: [id number]  [facility] PROGRESS NOTE  Meagan Garcia YWV:[id number] DOB: [birth year] DOA: 05/16/2013 PCP: [name], MD  Brief narrative: Pt is 63 y.o. female, with known history of lung cancer, followed by Dr. Earlie Server, status post chemotherapy and radiation therapy, who presented to [facility] ED with main concern of progressively worsening generalized weakness after an episode of fall she sustained at home, persistent and throbbing left hip pain post fall. She also explains she has had PICC line placed in January 2015, has history of lower extremity DVT and is on Lovenox but was not able to take it for three days as it was not available in pharmacy. In ED, US of the right upper extremity was significant for acute DVT and left hip xray was significant for non displaced fracture. [facility] asked to admit for further evaluation.   Principal Problem:   DVT (deep venous thrombosis) - possibly provoked as pt has not been taking blood thinner at home  - continue Lovenox per pharmacy  - monitor closely for sings of bleeding  Active Problems:   Left hip fracture - discussed with ortho on call by ED doctor - no surgical intervention required, weightbearing as pt able to tolerate - PT evaluation  - continue analgesia as needed for pain control    Acute respiratory failure - unclear etiology, pt has some wheezing and rhonchi on exam - will place on empiric ABX for now, BD's scheduled and as needed, solumedrol  - follow up on sputum analysis    Leukocytosis - possibly from DVT and ? PNA - ABS as noted above and repeat CBC in AM    DEPRESSION - appears to be clinically stable    HYPERTENSION - reasonable inpatient control    Non-small cell cancer of right lung - will notify primary oncologist of pt's admission    Elevated troponin - pt denies chest pain and no events on telemetry - troponins trending down, no need for  further trending in the absence of chest pain    Anemia of chronic disease - Hg and Hct stable and at baseline - repeat CBC in AM   Thrombocytopenia - no signs of active bleeding - repeat CBC in AM   Consultants:  Ortho   Procedures/Studies: Dg Chest 2 View   05/16/2013  Right-sided pleural effusion is smaller than was seen in January, primarily sub pulmonic. Mild right base volume loss.  Abnormal interstitial lung markings diffusely.  Right arm PICC tip pulled back slightly, at the level of the SVC just above the innominate vein.   Dg Hip Complete Left   05/16/2013   Cannot exclude nondisplaced fracture left greater trochanter. Correlate clinically for tenderness in this region.     Antibiotics:  Zithromax 3/10 -->  Code Status: Full Family Communication: Pt and daughter at bedside Disposition Plan: Home when medically stable  HPI/Subjective: No events overnight.   Objective: Filed Vitals:   05/17/13 1358 05/17/13 1359 05/17/13 1518 05/17/13 1607  BP: 119/61 111/62 117/62   Pulse: 103  101   Temp:   98.2 F (36.8 C)   TempSrc:   Oral   Resp:   18   Height:      Weight:      SpO2: 83%  96% 90%    Intake/Output Summary (Last 24 hours) at 05/17/13 1830 Last data filed at 05/17/13 0800  Gross per 24 hour  Intake  508.5  ml  Output    150 ml  Net  358.5 ml    Exam:   General:  Pt is alert, follows commands appropriately, not in acute distress  Cardiovascular: Regular rate and rhythm, S1/S2, no rubs, no gallops  Respiratory: Bilateral rhonchi with mild end expiratory wheezing  Abdomen: Soft, non tender, non distended, bowel sounds present, no guarding  Extremities: No edema, pulses DP and PT palpable bilaterally, TTP in the left hip area   Neuro: Grossly nonfocal  Data Reviewed: Basic Metabolic Panel:  Recent Labs Lab 05/16/13 1710 05/17/13 0345  NA 133* 133*  K 3.9 3.8  CL 94* 96  CO2 23 22  GLUCOSE 103* 94  BUN 13 12  CREATININE 0.92 0.97  CALCIUM  9.9 9.5   Liver Function Tests:  Recent Labs Lab 05/16/13 1710  AST 19  ALT 12  ALKPHOS 117  BILITOT 0.4  PROT 6.6  ALBUMIN 2.1*   CBC:  Recent Labs Lab 05/16/13 1710 05/17/13 0345  WBC 14.2* 11.6*  HGB 10.2* 9.6*  HCT 31.4* 29.2*  MCV 92.9 93.3  PLT 119* 115*   Cardiac Enzymes:  Recent Labs Lab 05/16/13 2330 05/17/13 0345 05/17/13 1035  TROPONINI 1.15* 0.84* 0.71*   Scheduled Meds: . albuterol  2.5 mg Nebulization QID  . azithromycin  500 mg Intravenous Q24H  . budesonide-formoterol  2 puff Inhalation BID  . buPROPion  150 mg Oral Daily  . cefTRIAXone (ROCEPHIN)  IV  1 g Intravenous Q24H  . cholecalciferol  1,000 Units Oral Daily  . feeding supplement (ENSURE COMPLETE)  237 mL Oral Q24H  . methylPREDNISolone (SOLU-MEDROL) injection  40 mg Intravenous Q12H  . nebivolol  10 mg Oral Daily  . sodium chloride  3 mL Intravenous Q12H  . tiotropium  18 mcg Inhalation Daily   Continuous Infusions: . sodium chloride 50 mL/hr at 05/16/13 2245  . heparin 1,450 Units/hr (05/17/13 1500)   Faye Ramsay, MD  [facility] Pager [phone]  If 7PM-7AM, please contact night-coverage [email or web address] Password [email or web address] 05/17/2013, 6:30 PM   LOS: 1 day

## 2013-05-17 NOTE — Evaluation (Signed)
Physical Therapy Evaluation Patient Details Name: Meagan Garcia MRN: [id number] DOB: [birth year] Today's Date: 05/17/2013 Time: 4081-4481 PT Time Calculation (min): 32 min  PT Assessment / Plan / Recommendation History of Present Illness  63 y.o. female with h/o lung cancer admitted withweakness, and fall. Pt sustained non-surgical L hip fx and is WBAT per chart, also has RUE DVT, on heparin.  Clinical Impression  **Pt admitted with fall, L hip fx(non-surgical), RUE DVT*. Pt currently with functional limitations due to the deficits listed below (see PT Problem List).  Pt will benefit from skilled PT to increase their independence and safety with mobility to allow discharge to the venue listed below.   *    PT Assessment  Patient needs continued PT services    Follow Up Recommendations  SNF    Does the patient have the potential to tolerate intense rehabilitation      Barriers to Discharge Decreased caregiver support per pt's friend, they are working on arranging 24* coverage at pt's home    Equipment Recommendations   (TBD)    Recommendations for Other Services     Frequency Min 3X/week    Precautions / Restrictions Precautions Precautions: Fall Precaution Comments: DVT RUE Restrictions Weight Bearing Restrictions: No Other Position/Activity Restrictions: WBAT LLE   Pertinent Vitals/Pain **8/10 RUE Pt refused pain meds  BP sitting 119/61 BP standing 111/62  SaO2 83% on RA with activity, 95% on 2L O2 at rest  HR 103*      Mobility  Bed Mobility Overal bed mobility: Needs Assistance Bed Mobility: Supine to Sit Supine to sit: Mod assist General bed mobility comments: assist to raise trunk, VCs for technique Transfers Overall transfer level: Needs assistance Equipment used: Rolling walker (2 wheeled) Transfers: Sit to/from Stand Sit to Stand: Mod assist General transfer comment: assist to rise; pt reported dizziness in sitting. BP 119/61 sitting, 111/62  standing. Pt sat on EOB x 7 minutes with no change in dizziness during that time. No increase in dizziness with cervical rotation. Dizziness started yesterday prior to her fall.  Ambulation/Gait Ambulation/Gait assistance: Min assist Ambulation Distance (Feet): 4 Feet Assistive device: Rolling walker (2 wheeled) Gait Pattern/deviations: Step-to pattern Gait velocity interpretation: Below normal speed for age/gender General Gait Details: pt took several pivotal steps from bed to recliner, tolerance limited by dizziness/fatigue    Exercises General Exercises - Lower Extremity Heel Slides: AAROM;Left;10 reps;Supine Hip ABduction/ADduction: AAROM;Left;10 reps;Supine   PT Diagnosis: Difficulty walking;Generalized weakness;Acute pain  PT Problem List: Decreased strength;Decreased mobility;Decreased activity tolerance;Decreased balance;Pain;Decreased knowledge of use of DME PT Treatment Interventions: DME instruction;Gait training;Stair training;Functional mobility training;Therapeutic activities;Therapeutic exercise;Patient/family education     PT Goals(Current goals can be found in the care plan section) Acute Rehab PT Goals Patient Stated Goal: to be able to walk more PT Goal Formulation: With patient Time For Goal Achievement: 05/31/13 Potential to Achieve Goals: Good  Visit Information  Last PT Received On: 05/17/13 Assistance Needed: +1 History of Present Illness: 63 y.o. female with h/o lung cancer admitted withweakness, and fall. Pt sustained non-surgical L hip fx and is WBAT per chart, also has RUE DVT, on heparin.       Prior [place] expects to be discharged to:: Private residence Living Arrangements: Alone Available Help at Discharge: Available 24 hours/day;Family Home Access: Stairs to enter Entrance Stairs-Number of Steps: 8 Entrance Stairs-Rails: None Home Layout: One level Home Equipment: [place] - 4 wheels;Shower seat Prior  Function Level of Independence: Independent with assistive  device(s) Communication Communication: No difficulties    Cognition  Cognition Arousal/Alertness: Awake/alert Behavior During Therapy: WFL for tasks assessed/performed Overall Cognitive Status: Within Functional Limits for tasks assessed    Extremity/Trunk Assessment Upper Extremity Assessment Upper Extremity Assessment: Overall WFL for tasks assessed Lower Extremity Assessment Lower Extremity Assessment: Overall WFL for tasks assessed Cervical / Trunk Assessment Cervical / Trunk Assessment: Normal   Balance Balance Overall balance assessment: Needs assistance Sitting-balance support: Bilateral upper extremity supported;Feet supported Sitting balance-Leahy Scale: Fair Standing balance-Leahy Scale: Poor  End of Session PT - End of Session Equipment Utilized During Treatment: Gait belt;Oxygen Activity Tolerance: Patient limited by fatigue;Treatment limited secondary to medical complications (Comment) (dizziness) Patient left: in chair;with call bell/phone within reach;with family/visitor present Nurse Communication: Mobility status  GP     [name] 05/17/2013, 2:08 PM [phone]

## 2013-05-17 NOTE — Progress Notes (Signed)
CRITICAL VALUE ALERT  Critical value received:  Troponin 1.15  Date of notification:  05/17/13  Time of notification:  1206  Critical value read back:yes  Nurse who received alert:  Star Age, RN   MD notified (1st page):  Raliegh Ip Schorr  Time of first page:  1207  MD notified (2nd page):  Time of second page:  Responding MD:  0  Time MD responded:  0  no return call. Pt on heparin drip

## 2013-05-17 NOTE — Progress Notes (Signed)
Clinical Social Work Department CLINICAL SOCIAL WORK PLACEMENT NOTE 05/17/2013  Patient:  Meagan Garcia, Meagan Garcia  Account Number:  [id number] Admit date:  05/16/2013  Clinical Social Worker:  Renold Genta  Date/time:  05/17/2013 02:54 PM  Clinical Social Work is seeking post-discharge placement for this patient at the following level of care:   SKILLED NURSING   (*CSW will update this form in Epic as items are completed)   05/17/2013  Patient/family provided with [place] Department of Clinical Social Work's list of facilities offering this level of care within the geographic area requested by the patient (or if unable, by the patient's family).  05/17/2013  Patient/family informed of their freedom to choose among providers that offer the needed level of care, that participate in Medicare, Medicaid or managed care program needed by the patient, have an available bed and are willing to accept the patient.  05/17/2013  Patient/family informed of [facility]' ownership interest in [facility], as well as of the fact that they are under no obligation to receive care at this facility.  PASARR submitted to EDS on 05/17/2013 PASARR number received from EDS on 05/17/2013  FL2 transmitted to all facilities in geographic area requested by pt/family on  05/17/2013 FL2 transmitted to all facilities within larger geographic area on   Patient informed that his/her managed care company has contracts with or will negotiate with  certain facilities, including the following:     Patient/family informed of bed offers received:   Patient chooses bed at  Physician recommends and patient chooses bed at    Patient to be transferred to  on   Patient to be transferred to facility by   The following physician request were entered in Epic:   Additional Comments:   Raynaldo Opitz, [place] Social Worker cell #: [phone]

## 2013-05-17 NOTE — Progress Notes (Signed)
INITIAL NUTRITION ASSESSMENT  DOCUMENTATION CODES Per approved criteria  -Severe malnutrition in the context of chronic illness -Obesity Unspecified  Pt meets criteria for severe MALNUTRITION in the context of chronic illness as evidenced by 9.2% weight loss in 3 month period, <75% est nutrition needs for > one month.   INTERVENTION: -Recommend Ensure Complete Q24H -Consider liberalizing diet to Regular or CHO Mod as PO intake <25% -Will add 2PM and 8PM snacks   NUTRITION DIAGNOSIS: Inadequate oral intake related to loss of taste/early satiety as evidenced by PO intake <75% for 2 months, 20 lbs unintentional wt loss.   Goal: Pt to meet >/= 90% of their estimated nutrition needs    Monitor:  Diet order, total protein/energy intake, labs, weights  Reason for Assessment: MST  63 y.o. female  Admitting Dx: DVT (deep venous thrombosis)  ASSESSMENT: Meagan Garcia is a 63 y.o. female, with known history of lung cancer, followed by Dr. Earlie Server, status post chemotherapy and radiation, patient presents today for multiple complaints, mainly for generalized weakness, status post fall while she was undertaken with her walker, with left hip pain, and right upper extremity swelling and tenderness   -Pt's daughter reported poor PO intake for past 2 months, which started after chemo/radiation treatments -Pt consuming 1-2 bites of meals, and sips on one Ensure Complete daily. Noted feelings of early satiety and loss of taste -PO intake during admit <25%. Recommend diet be liberalized to regular vs CHO mod as pt with minimal intake and more food options may encourage appetite -Has had significant weight loss. Reported 10 lbs wt loss in past week, and overall 20 lbs wt loss in 2 months.  -Encouraged small frequent snacks to assist in early satiety. Recommend protein sources to prevent muscle loss -Promoted use of strong flavors and marinades, limit use of metal utensils to assist with taste  changes.    Height: Ht Readings from Last 1 Encounters:  05/16/13 5\' 3"  (1.6 m)    Weight: Wt Readings from Last 1 Encounters:  05/16/13 196 lb 10.4 oz (89.2 kg)    Ideal Body Weight: 115 lbs  % Ideal Body Weight: 170%  Wt Readings from Last 10 Encounters:  05/16/13 196 lb 10.4 oz (89.2 kg)  04/19/13 206 lb 1.6 oz (93.486 kg)  04/18/13 203 lb 3.2 oz (92.171 kg)  04/12/13 207 lb 8 oz (94.121 kg)  04/08/13 208 lb (94.348 kg)  04/06/13 211 lb (95.709 kg)  04/05/13 208 lb 4.8 oz (94.484 kg)  04/04/13 206 lb 1.6 oz (93.486 kg)  03/29/13 210 lb 6.4 oz (95.437 kg)  03/23/13 215 lb (97.523 kg)    Usual Body Weight: 216 lbs  % Usual Body Weight: 91%  BMI:  Body mass index is 34.84 kg/(m^2). Class I Obesity  Estimated Nutritional Needs: Kcal: 1800-2000 Protein: 80-90 gram Fluid:>/=1800 ml/daily  Skin: WDL  Diet Order: General  EDUCATION NEEDS: -No education needs identified at this time   Intake/Output Summary (Last 24 hours) at 05/17/13 1152 Last data filed at 05/17/13 0800  Gross per 24 hour  Intake  508.5 ml  Output    150 ml  Net  358.5 ml    Last BM: 3/08   Labs:   Recent Labs Lab 05/16/13 1710 05/17/13 0345  NA 133* 133*  K 3.9 3.8  CL 94* 96  CO2 23 22  BUN 13 12  CREATININE 0.92 0.97  CALCIUM 9.9 9.5  GLUCOSE 103* 94    CBG (last 3)  No  results found for this basename: GLUCAP,  in the last 72 hours  Scheduled Meds: . albuterol  2.5 mg Nebulization QID  . azithromycin  500 mg Intravenous Q24H  . budesonide-formoterol  2 puff Inhalation BID  . buPROPion  150 mg Oral Daily  . cefTRIAXone (ROCEPHIN)  IV  1 g Intravenous Q24H  . cholecalciferol  1,000 Units Oral Daily  . feeding supplement (ENSURE COMPLETE)  237 mL Oral Q24H  . methylPREDNISolone (SOLU-MEDROL) injection  40 mg Intravenous Q12H  . nebivolol  10 mg Oral Daily  . sodium chloride  3 mL Intravenous Q12H  . tiotropium  18 mcg Inhalation Daily    Continuous Infusions: .  sodium chloride 50 mL/hr at 05/16/13 2245  . heparin 1,400 Units/hr (05/17/13 0100)    Past Medical History  Diagnosis Date  . Anxiety   . Depression   . Peripheral neuropathy     feet: unknown etiol  . Elevated glucose   . Allergic rhinitis   . GERD (gastroesophageal reflux disease)   . Complication of anesthesia w/tubal    got anest. "went crazy"   . Non-small cell lung cancer     Past Surgical History  Procedure Laterality Date  . Abdominal hysterectomy      partial  . Tubal reversal    . Tubal ligation      x2  . Fibroid tumors    . Knee arthroscopy    . Foot surgery      cysts from both feet  . Video bronchoscopy Bilateral 02/21/2013    Procedure: VIDEO BRONCHOSCOPY WITHOUT FLUORO;  Surgeon: Doree Fudge, MD;  Location: [facility];  Service: Cardiopulmonary;  Laterality: Bilateral;    [place] LDN Clinical Dietitian [phone]

## 2013-05-17 NOTE — Progress Notes (Signed)
Utilization review completed.  

## 2013-05-18 ENCOUNTER — Telehealth: Payer: Self-pay | Admitting: Internal Medicine

## 2013-05-18 DIAGNOSIS — E43 Unspecified severe protein-calorie malnutrition: Secondary | ICD-10-CM | POA: Insufficient documentation

## 2013-05-18 LAB — CBC
HCT: 29.9 % — ABNORMAL LOW (ref 36.0–46.0)
HEMATOCRIT: 28.9 % — AB (ref 36.0–46.0)
HEMOGLOBIN: 9.2 g/dL — AB (ref 12.0–15.0)
Hemoglobin: 9.8 g/dL — ABNORMAL LOW (ref 12.0–15.0)
MCH: 30.3 pg (ref 26.0–34.0)
MCH: 30.7 pg (ref 26.0–34.0)
MCHC: 31.8 g/dL (ref 30.0–36.0)
MCHC: 32.8 g/dL (ref 30.0–36.0)
MCV: 95.1 fL (ref 78.0–100.0)
MCV: 93.7 fL (ref 78.0–100.0)
PLATELETS: 197 10*3/uL (ref 150–400)
Platelets: 139 10*3/uL — ABNORMAL LOW (ref 150–400)
RBC: 3.04 MIL/uL — ABNORMAL LOW (ref 3.87–5.11)
RBC: 3.19 MIL/uL — AB (ref 3.87–5.11)
RDW: 19.8 % — AB (ref 11.5–15.5)
RDW: 19.5 % — ABNORMAL HIGH (ref 11.5–15.5)
WBC: 9.8 10*3/uL (ref 4.0–10.5)
WBC: 14.5 10*3/uL — AB (ref 4.0–10.5)

## 2013-05-18 LAB — BASIC METABOLIC PANEL
BUN: 11 mg/dL (ref 6–23)
CO2: 21 mEq/L (ref 19–32)
CREATININE: 0.81 mg/dL (ref 0.50–1.10)
Calcium: 9.5 mg/dL (ref 8.4–10.5)
Chloride: 101 mEq/L (ref 96–112)
GFR, EST AFRICAN AMERICAN: 89 mL/min — AB (ref >90)
GFR, EST NON AFRICAN AMERICAN: 77 mL/min — AB (ref >90)
Glucose, Bld: 126 mg/dL — ABNORMAL HIGH (ref 70–99)
Potassium: 4 mEq/L (ref 3.7–5.3)
Sodium: 137 mEq/L (ref 137–147)

## 2013-05-18 LAB — HEPARIN LEVEL (UNFRACTIONATED): Heparin Unfractionated: 0.3 IU/mL (ref 0.30–0.70)

## 2013-05-18 LAB — STREP PNEUMONIAE URINARY ANTIGEN: Strep Pneumo Urinary Antigen: NEGATIVE

## 2013-05-18 LAB — LEGIONELLA ANTIGEN, URINE: Legionella Antigen, Urine: NEGATIVE

## 2013-05-18 MED ORDER — HEPARIN (PORCINE) IN NACL 100-0.45 UNIT/ML-% IJ SOLN
1600.0000 [IU]/h | INTRAMUSCULAR | Status: DC
Start: 2013-05-18 — End: 2013-05-18
  Filled 2013-05-18: qty 250

## 2013-05-18 MED ORDER — ASPIRIN EC 81 MG PO TBEC
81.0000 mg | DELAYED_RELEASE_TABLET | Freq: Every day | ORAL | Status: DC
  Administered 2013-05-18 – 2013-05-24 (×7): 81 mg via ORAL
  Filled 2013-05-18 (×7): qty 1

## 2013-05-18 MED ORDER — METHYLPREDNISOLONE SODIUM SUCC 40 MG IJ SOLR
40.0000 mg | Freq: Every day | INTRAMUSCULAR | Status: DC
  Administered 2013-05-19: 40 mg via INTRAVENOUS
  Filled 2013-05-18: qty 1

## 2013-05-18 MED ORDER — ENOXAPARIN SODIUM 100 MG/ML ~~LOC~~ SOLN
1.0000 mg/kg | Freq: Two times a day (BID) | SUBCUTANEOUS | Status: DC
  Administered 2013-05-18 – 2013-05-24 (×12): 90 mg via SUBCUTANEOUS
  Filled 2013-05-18 (×14): qty 1

## 2013-05-18 MED ORDER — ALBUTEROL SULFATE (2.5 MG/3ML) 0.083% IN NEBU
2.5000 mg | INHALATION_SOLUTION | Freq: Two times a day (BID) | RESPIRATORY_TRACT | Status: DC
  Administered 2013-05-18 – 2013-05-21 (×6): 2.5 mg via RESPIRATORY_TRACT
  Filled 2013-05-18 (×6): qty 3

## 2013-05-18 NOTE — Progress Notes (Signed)
ANTICOAGULATION CONSULT NOTE - Follow Up  Pharmacy Consult for Heparin Indication: DVT  No Known Allergies  Patient Measurements: Height: 5\' 3"  (160 cm) Weight: 196 lb 10.4 oz (89.2 kg) IBW/kg (Calculated) : 52.4 Heparin Dosing Weight: 72 kg  Vital Signs: Temp: 97.7 F (36.5 C) (03/11 0614) Temp src: Oral (03/11 0614) BP: 122/64 mmHg (03/11 0614) Pulse Rate: 95 (03/11 0614)  Labs:  Recent Labs  05/16/13 1710 05/16/13 2330  05/17/13 0345 05/17/13 0840 05/17/13 1035 05/17/13 1424 05/18/13 0335  HGB 10.2*  --   --  9.6*  --   --   --  9.2*  HCT 31.4*  --   --  29.2*  --   --   --  28.9*  PLT 119*  --   --  115*  --   --   --  139*  APTT 40*  --   --   --   --   --   --   --   LABPROT 15.7*  --   --   --   --   --   --   --   INR 1.28  --   --   --   --   --   --   --   HEPARINUNFRC  --   --   < >  --  0.31  --  0.30 0.30  CREATININE 0.92  --   --  0.97  --   --   --  0.81  TROPONINI  --  1.15*  --  0.84*  --  0.71*  --   --   < > = values in this interval not displayed.  Estimated Creatinine Clearance: 77.3 ml/min (by C-G formula based on Cr of 0.81).   Medical History: Past Medical History  Diagnosis Date  . Anxiety   . Depression   . Peripheral neuropathy     feet: unknown etiol  . Elevated glucose   . Allergic rhinitis   . GERD (gastroesophageal reflux disease)   . Complication of anesthesia w/tubal    got anest. "went crazy"   . 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  Pharmacy consulted to start heparin.  Last dose of lovenox 150mg  daily 3/6 (Friday) per patient (per pt, out of stock at outpatient pharmacy).     Heparin level still at low end of therapeutic range despite increases in rate  Hgb stable and Plts improved  Troponin elevated  No reported bleeding per  patient  Goal of Therapy:  Heparin level 0.3-0.7 units/ml Monitor platelets by anticoagulation protocol: Yes   Plan:  1. Increase heparin from 1450 units/hr to 1600 units/hr 2. Recheck heparin level in 6 hours after rate increase 3. With patient not able to get herself Lovenox x 3 days prior to admission, what is ultimate plan for anticoagulation? Likely not Lovenox failure since did not take a few days? ?Restart Lovenox at this point   Adrian Saran, PharmD, BCPS Pager [phone] 05/18/2013 8:21 AM

## 2013-05-18 NOTE — Progress Notes (Signed)
Physical Therapy Treatment Patient Details Name: Meagan Garcia MRN: [id number] DOB: 1951-02-25 Today's Date: 05/18/2013 Time: 0349-1791 PT Time Calculation (min): 20 min  PT Assessment / Plan / Recommendation  History of Present Illness 63 y.o. female with h/o lung cancer admitted withweakness, and fall. Pt sustained non-surgical L hip fx and is WBAT per chart, also has RUE DVT, on heparin.   PT Comments   Progressing with mobility.   Follow Up Recommendations  SNF     Does the patient have the potential to tolerate intense rehabilitation     Barriers to Discharge        Equipment Recommendations  None recommended by PT    Recommendations for Other Services    Frequency Min 3X/week   Progress towards PT Goals Progress towards PT goals: Progressing toward goals  Plan Current plan remains appropriate    Precautions / Restrictions Precautions Precautions: Fall Precaution Comments: DVT RUE Restrictions Weight Bearing Restrictions: No Other Position/Activity Restrictions: WBAT LLE   Pertinent Vitals/Pain 7/10 L LE with activity.     Mobility  Bed Mobility General bed mobility comments: pt sitting in recliner Transfers Overall transfer level: Needs assistance Equipment used: Rolling walker (2 wheeled) Transfers: Sit to/from Stand Sit to Stand: Min assist General transfer comment: Assist to rise, stabilize, control descent. VCs safety, technique, hand placement  Ambulation/Gait Ambulation/Gait assistance: Min assist Ambulation Distance (Feet): 135 Feet Assistive device: Rolling walker (2 wheeled) Gait Pattern/deviations: Antalgic;Decreased stride length;Decreased step length - left;Decreased weight shift to left General Gait Details: slow gait speed. Gait antalgic. Assist to stabilize throughout ambulation. O2 sats 95% on 3L. Multiple brief standing rest breaks needed during walk. Dyspnea 3/4.     Exercises General Exercises - Lower Extremity Ankle Circles/Pumps:  AROM;Both;10 reps;Seated Long Arc Quad: AROM;Both;10 reps;Seated   PT Diagnosis:    PT Problem List:   PT Treatment Interventions:     PT Goals (current goals can now be found in the care plan section)    Visit Information  Last PT Received On: 05/18/13 Assistance Needed: +1 History of Present Illness: 63 y.o. female with h/o lung cancer admitted withweakness, and fall. Pt sustained non-surgical L hip fx and is WBAT per chart, also has RUE DVT, on heparin.    Subjective Data      Cognition  Cognition Arousal/Alertness: Awake/alert Behavior During Therapy: WFL for tasks assessed/performed Overall Cognitive Status: Within Functional Limits for tasks assessed    Balance     End of Session PT - End of Session Equipment Utilized During Treatment: Oxygen Activity Tolerance: Patient limited by fatigue;Patient limited by pain Patient left: in chair;with call bell/phone within reach   GP     Weston Anna, MPT Pager: [phone]

## 2013-05-18 NOTE — Progress Notes (Addendum)
[facility] PROGRESS NOTE  Meagan Garcia JJK:[id number] DOB: 08/30/1950 DOA: 05/16/2013 PCP: [name], MD  Assessment/Plan: Pt is 63 y.o. female, with known history of lung cancer, followed by Dr. Earlie Server, status post chemotherapy and radiation therapy, who presented to [facility] ED with main concern of progressively worsening generalized weakness after an episode of fall she sustained at home, persistent and throbbing left hip pain post fall. She also explains she has had PICC line placed in January 2015, has history of lower extremity DVT and is on Lovenox but was not able to take it for three days as it was not available in pharmacy. In ED, US of the right upper extremity was significant for acute DVT and left hip xray was significant for non displaced fracture. [facility] asked to admit for further evaluation.   1. DVT (deep venous thrombosis) R upper extremity  - possibly provoked as pt has not been taking blood thinner at home  - Pt was on IV heparin; switch to Lovenox per pharmacy  - monitor closely for sings of bleeding   2. Left hip fracture  - discussed with ortho on call by ED doctor; called ortho consult    - no surgical intervention required, weightbearing as pt able to tolerate  - PT evaluation  - continue analgesia as needed for pain control  3. Acute respiratory failure  - unclear etiology, pt has some wheezing and rhonchi on exam  - placed on empiric ABX for now, BD's scheduled and as needed, solumedrol/taper  - follow up on sputum analysis  4. Leukocytosis  - possibly from DVT and ? PNA - ABS as noted above and repeat CBC in AM  5. Non-small cell cancer of right lung  - will notify primary oncologist of pt's admission  6. Elevated troponin  - pt denies chest pain and no events on telemetry  - troponins trending down, no need for further trending in the absence of chest pain,  - 3/11: start ASA, cont BB, obtain echo  7. Anemia of chronic disease  - Hg and Hct stable  and at baseline  - repeat CBC in AM  8. Thrombocytopenia  - no signs of active bleeding  - repeat CBC in AM     Consultants:  Ortho  Procedures/Studies:  Dg Chest 2 View 05/16/2013 Right-sided pleural effusion is smaller than was seen in January, primarily sub pulmonic. Mild right base volume loss. Abnormal interstitial lung markings diffusely. Right arm PICC tip pulled back slightly, at the level of the SVC just above the innominate vein.  Dg Hip Complete Left 05/16/2013 Cannot exclude nondisplaced fracture left greater trochanter. Correlate clinically for tenderness in this region.  Antibiotics:  Zithromax 3/10 -->   Code Status: full Family Communication: d/w patient, her friend  (indicate person spoken with, relationship, and if by phone, the number) Disposition Plan: pend clinical improvement, SNF   Consultants:  ortho  Procedures:  None   HPI/Subjective: alert  Objective: Filed Vitals:   05/18/13 1308  BP: 117/61  Pulse: 102  Temp: 97.5 F (36.4 C)  Resp: 20    Intake/Output Summary (Last 24 hours) at 05/18/13 1342 Last data filed at 05/18/13 1300  Gross per 24 hour  Intake 1149.5 ml  Output      0 ml  Net 1149.5 ml   Filed Weights   05/16/13 1748 05/16/13 2231  Weight: 93.441 kg (206 lb) 89.2 kg (196 lb 10.4 oz)    Exam:   General:  alert  Cardiovascular: s1,s2 rrr  Respiratory: CAT BL  Abdomen: soft, nt,nd  Musculoskeletal: no LE edema    Data Reviewed: Basic Metabolic Panel:  Recent Labs Lab 05/16/13 1710 05/17/13 0345 05/18/13 0335  NA 133* 133* 137  K 3.9 3.8 4.0  CL 94* 96 101  CO2 23 22 21   GLUCOSE 103* 94 126*  BUN 13 12 11   CREATININE 0.92 0.97 0.81  CALCIUM 9.9 9.5 9.5   Liver Function Tests:  Recent Labs Lab 05/16/13 1710  AST 19  ALT 12  ALKPHOS 117  BILITOT 0.4  PROT 6.6  ALBUMIN 2.1*   No results found for this basename: LIPASE, AMYLASE,  in the last 168 hours No results found for this basename: AMMONIA,   in the last 168 hours CBC:  Recent Labs Lab 05/16/13 1710 05/17/13 0345 05/18/13 0335  WBC 14.2* 11.6* 9.8  HGB 10.2* 9.6* 9.2*  HCT 31.4* 29.2* 28.9*  MCV 92.9 93.3 95.1  PLT 119* 115* 139*   Cardiac Enzymes:  Recent Labs Lab 05/16/13 2330 05/17/13 0345 05/17/13 1035  TROPONINI 1.15* 0.84* 0.71*   BNP (last 3 results)  Recent Labs  03/07/13 1210  PROBNP 933.4*   CBG: No results found for this basename: GLUCAP,  in the last 168 hours  No results found for this or any previous visit (from the past 240 hour(s)).   Studies: Dg Chest 2 View  05/16/2013   CLINICAL DATA:  Hypertension. Smoking history. Cough. History of lung cancer.  EXAM: CHEST  2 VIEW  COMPARISON:  03/14/2013  FINDINGS: Right arm PICC has its tip in the SVC just above the entrance of the innominate vein. Pleural effusion seen previously as much smaller on the right. There is mild right base spine loss. Abnormal interstitial lung markings remain evident bilaterally. No new or worsening finding.  IMPRESSION: Right-sided pleural effusion is smaller than was seen in January, primarily sub pulmonic. Mild right base volume loss.  Abnormal interstitial lung markings diffusely.  Right arm PICC tip pulled back slightly, at the level of the SVC just above the innominate vein.   Electronically Signed   By: Nelson Chimes M.D.   On: 05/16/2013 16:39   Dg Hip Complete Left  05/16/2013   CLINICAL DATA:  Acute left hip pain. The patient has been dizzy with a recent fall, according to Epic note.  EXAM: LEFT HIP - COMPLETE 2+ VIEW  COMPARISON:  None.  FINDINGS: There is slight irregularity of the left greater trochanter; acute nondisplaced fracture cannot be excluded. Femoral neck is intact. No pelvic fractures are evident. There is degenerative change lumbar spine.  IMPRESSION: Cannot exclude nondisplaced fracture left greater trochanter. Correlate clinically for tenderness in this region.   Electronically Signed   By: Rolla Flatten  M.D.   On: 05/16/2013 16:33    Scheduled Meds: . albuterol  2.5 mg Nebulization BID  . azithromycin  500 mg Intravenous Q24H  . budesonide-formoterol  2 puff Inhalation BID  . buPROPion  150 mg Oral Daily  . cefTRIAXone (ROCEPHIN)  IV  1 g Intravenous Q24H  . cholecalciferol  1,000 Units Oral Daily  . feeding supplement (ENSURE COMPLETE)  237 mL Oral Q24H  . methylPREDNISolone (SOLU-MEDROL) injection  40 mg Intravenous Q12H  . nebivolol  10 mg Oral Daily  . sodium chloride  3 mL Intravenous Q12H  . tiotropium  18 mcg Inhalation Daily   Continuous Infusions: . heparin 1,600 Units/hr (05/18/13 0830)    Principal Problem:  DVT (deep venous thrombosis) Active Problems:   DEPRESSION   HYPERTENSION   Non-small cell cancer of right lung   Elevated troponin   Hip fracture, left   Protein-calorie malnutrition, severe    Time spent: >35 minutes     Kinnie Feil  [facility] Pager [phone]. If 7PM-7AM, please contact night-coverage at [email or web address], password [facility] 05/18/2013, 1:42 PM  LOS: 2 days

## 2013-05-18 NOTE — Progress Notes (Signed)
CSW provided patient with bed offers - she states she wants to review the list with her pastor who is familiar with some of the facilities. CSW will follow-up in the morning for SNF decision.    Clinical Social Work Department CLINICAL SOCIAL WORK PLACEMENT NOTE 05/18/2013  Patient:  Meagan Garcia, Meagan Garcia  Account Number:  [id number] Admit date:  05/16/2013  Clinical Social Worker:  Renold Genta  Date/time:  05/17/2013 02:54 PM  Clinical Social Work is seeking post-discharge placement for this patient at the following level of care:   SKILLED NURSING   (*CSW will update this form in Epic as items are completed)   05/17/2013  Patient/family provided with [place] Department of Clinical Social Work's list of facilities offering this level of care within the geographic area requested by the patient (or if unable, by the patient's family).  05/17/2013  Patient/family informed of their freedom to choose among providers that offer the needed level of care, that participate in Medicare, Medicaid or managed care program needed by the patient, have an available bed and are willing to accept the patient.  05/17/2013  Patient/family informed of [facility]' ownership interest in [facility], as well as of the fact that they are under no obligation to receive care at this facility.  PASARR submitted to EDS on 05/17/2013 PASARR number received from EDS on 05/17/2013  FL2 transmitted to all facilities in geographic area requested by pt/family on  05/17/2013 FL2 transmitted to all facilities within larger geographic area on   Patient informed that his/her managed care company has contracts with or will negotiate with  certain facilities, including the following:   BCBS     Patient/family informed of bed offers received:  05/18/2013 Patient chooses bed at  Physician recommends and patient chooses bed at    Patient to be transferred to  on   Patient to be transferred to facility  by   The following physician request were entered in Epic:   Additional Comments:   Raynaldo Opitz, [place] Social Worker cell #: [phone]

## 2013-05-18 NOTE — Telephone Encounter (Signed)
mri per 3/9 pof for 3/9 not acted on due to pt in hosp. comment in order.

## 2013-05-18 NOTE — Progress Notes (Signed)
ANTICOAGULATION CONSULT NOTE - Initial  Pharmacy Consult for Lovenox Indication: DVT  No Known Allergies  Patient Measurements: Height: 5\' 3"  (160 cm) Weight: 196 lb 10.4 oz (89.2 kg) IBW/kg (Calculated) : 52.4  Vital Signs: Temp: 97.5 F (36.4 C) (03/11 1308) Temp src: Oral (03/11 1308) BP: 117/61 mmHg (03/11 1308) Pulse Rate: 102 (03/11 1308)  Labs:  Recent Labs  05/16/13 1710 05/16/13 2330  05/17/13 0345 05/17/13 0840 05/17/13 1035 05/17/13 1424 05/18/13 0335  HGB 10.2*  --   --  9.6*  --   --   --  9.2*  HCT 31.4*  --   --  29.2*  --   --   --  28.9*  PLT 119*  --   --  115*  --   --   --  139*  APTT 40*  --   --   --   --   --   --   --   LABPROT 15.7*  --   --   --   --   --   --   --   INR 1.28  --   --   --   --   --   --   --   HEPARINUNFRC  --   --   < >  --  0.31  --  0.30 0.30  CREATININE 0.92  --   --  0.97  --   --   --  0.81  TROPONINI  --  1.15*  --  0.84*  --  0.71*  --   --   < > = values in this interval not displayed.  Estimated Creatinine Clearance: 77.3 ml/min (by C-G formula based on Cr of 0.81).   Medical History: Past Medical History  Diagnosis Date  . Anxiety   . Depression   . Peripheral neuropathy     feet: unknown etiol  . Elevated glucose   . Allergic rhinitis   . GERD (gastroesophageal reflux disease)   . Complication of anesthesia w/tubal    got anest. "went crazy"   . 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  Pharmacy consulted to start heparin.  Last dose of lovenox 150mg  daily 3/6 (Friday) per patient (missed 3 days since out of stock at outpatient pharmacy per patient).   MD wants to transition back to Lovenox.  Heparin level obtained this afternoon was 0.30, therapeutic with infusion at 1600 units/hr.  Infusion stopped at 1447.  Weight  89.2 kg  SCr 0.81, CrCl~77 ml/min  Goal of Therapy:  Anti-Xa level 0.6-1 unit/ml Monitor platelets by anticoagulation protocol: Yes   Plan:  1.  Start Lovenox 1 hour after heparin infusion stopped (~1600).  Start Lovenox 90 mg (1 mg/kg) SQ q12h. 2.  Check CBC at least every 72 hours.  Hershal Coria, PharmD, BCPS Pager: [phone] 05/18/2013 3:19 PM

## 2013-05-19 ENCOUNTER — Other Ambulatory Visit: Payer: Self-pay | Admitting: Internal Medicine

## 2013-05-19 ENCOUNTER — Inpatient Hospital Stay (HOSPITAL_COMMUNITY): Payer: BC Managed Care – PPO

## 2013-05-19 ENCOUNTER — Encounter (HOSPITAL_COMMUNITY): Payer: Self-pay | Admitting: Radiology

## 2013-05-19 ENCOUNTER — Ambulatory Visit: Payer: BC Managed Care – PPO

## 2013-05-19 DIAGNOSIS — C779 Secondary and unspecified malignant neoplasm of lymph node, unspecified: Secondary | ICD-10-CM

## 2013-05-19 DIAGNOSIS — C50919 Malignant neoplasm of unspecified site of unspecified female breast: Secondary | ICD-10-CM

## 2013-05-19 DIAGNOSIS — C3491 Malignant neoplasm of unspecified part of right bronchus or lung: Secondary | ICD-10-CM

## 2013-05-19 DIAGNOSIS — R5381 Other malaise: Secondary | ICD-10-CM

## 2013-05-19 DIAGNOSIS — R5383 Other fatigue: Secondary | ICD-10-CM

## 2013-05-19 LAB — MAGNESIUM: Magnesium: 2.1 mg/dL (ref 1.5–2.5)

## 2013-05-19 MED ORDER — PREDNISONE 20 MG PO TABS
20.0000 mg | ORAL_TABLET | Freq: Every day | ORAL | Status: DC
  Administered 2013-05-20 – 2013-05-24 (×5): 20 mg via ORAL
  Filled 2013-05-19 (×7): qty 1

## 2013-05-19 MED ORDER — GADOBENATE DIMEGLUMINE 529 MG/ML IV SOLN
18.0000 mL | Freq: Once | INTRAVENOUS | Status: AC | PRN
  Administered 2013-05-19: 18 mL via INTRAVENOUS

## 2013-05-19 NOTE — Progress Notes (Signed)
DIAGNOSIS: Stage IIIB (T2b, N3, M0) non-small cell lung cancer, invasive squamous cell carcinoma diagnosed in December of 2014.  Primary site: Lung (Right)  Staging method: AJCC 7th Edition  Clinical: Stage IIIB (T2b, N3, M0)  Summary: Stage IIIB (T2b, N3, M0)   PRIOR THERAPY: None   CURRENT THERAPY: Concurrent chemoradiation with weekly carboplatin for an AUC of 2 and paclitaxel at 45 mg per meter square for total of 6-7 weeks depending on the final dose of radiation. She status post 5 cycles.   DISEASE STAGE:  Non-small cell cancer of right lung  Primary site: Lung (Right)  Staging method: AJCC 7th Edition  Clinical: Stage IIIB (T2b, N3, M0)  Summary: Stage IIIB (T2b, N3, M0)  CHEMOTHERAPY INTENT: control/palliative  CURRENT # OF CHEMOTHERAPY CYCLES: 6  CURRENT ANTIEMETICS: Dexamethasone, Zofran, Compazine  CURRENT SMOKING STATUS: Former smoker, quit 02/19/1996  ORAL CHEMOTHERAPY AND CONSENT: n/a  CURRENT BISPHOSPHONATES USE: None  PAIN MANAGEMENT: oxycodone  NARCOTICS INDUCED CONSTIPATION: nine  LIVING WILL AND CODE STATUS: ?   Subjective: Ms. Heider is seen and examined today. Several family members were at the bedside. She was admitted recently to [facility] with multiple complaints including generalized weakness and fatigue as well as for with fracture of the left hip. She was also found to have deep venous thrombosis of the right upper extremity. She is currently on treatment with Lovenox. The patient is feeling fine but she noticed several lumps in the neck, left axilla as well as back suspicious for disease progression. She recently completed a course of concurrent chemoradiation with weekly carboplatin and paclitaxel. She is feeling a little bit better today. She denied having any significant fever or chills. She has no nausea or vomiting.  Objective: Vital signs in last 24 hours: Temp:  [97.9 F (36.6 C)-98.4 F (36.9 C)] 98.3 F (36.8 C) (03/12 1018) Pulse  Rate:  [90-98] 90 (03/12 1018) Resp:  [18-19] 19 (03/12 0425) BP: (113-128)/(58-67) 128/67 mmHg (03/12 1018) SpO2:  [91 %-96 %] 91 % (03/12 1018)  Intake/Output from previous day: 03/11 0701 - 03/12 0700 In: 1716.8 [P.O.:1080; I.V.:136.8; IV Piggyback:500] Out: -  Intake/Output this shift: Total I/O In: 120 [P.O.:120] Out: -   General appearance: alert, cooperative, fatigued and no distress Resp: clear to auscultation bilaterally Cardio: regular rate and rhythm, S1, S2 normal, no murmur, click, rub or gallop GI: soft, non-tender; bowel sounds normal; no masses,  no organomegaly Extremities: extremities normal, atraumatic, no cyanosis or edema  Lab Results:   Recent Labs  05/18/13 0335 05/18/13 1458  WBC 9.8 14.5*  HGB 9.2* 9.8*  HCT 28.9* 29.9*  PLT 139* 197   BMET  Recent Labs  05/17/13 0345 05/18/13 0335  NA 133* 137  K 3.8 4.0  CL 96 101  CO2 22 21  GLUCOSE 94 126*  BUN 12 11  CREATININE 0.97 0.81  CALCIUM 9.5 9.5    Studies/Results: Ct Hip Left Wo Contrast  05/19/2013   CLINICAL DATA:  Left hip pain secondary to a fall. Abnormal radiograph on 05/16/2013 suggests a small avulsion of the left greater trochanter.  EXAM: CT OF THE LEFT HIP WITHOUT CONTRAST  TECHNIQUE: Multidetector CT imaging was performed according to the standard protocol. Multiplanar CT image reconstructions were also generated.  COMPARISON:  Radiographs dated 05/16/2013  FINDINGS: There is a minimally displaced fracture of the tip of the left greater trochanter at the insertion of the left gluteus minimus tendon. The left femur is otherwise intact. The  visualized pelvic bones are normal.  Note is made of a 12 mm soft tissue density in the left inguinal canal, probably a lymph node. There is also a 10 mm lymph node in the left inguinal region on image number 43 of series 3. These are nonspecific.  The patient has degenerative disc disease at L5-S1. There are multiple diverticula in the distal  colon.  IMPRESSION: 1. Small avulsion fracture of the tip of the greater trochanter of the proximal left femur. 2. Small lymph nodes in the left inguinal canal and left groin, nonspecific.   Electronically Signed   By: Rozetta Nunnery M.D.   On: 05/19/2013 10:18    Medications: I have reviewed the patient's current medications.  Assessment/Plan: 1) metastatic non-small cell lung cancer initially diagnosed as stage IIIB squamous cell carcinoma status post a course of concurrent chemoradiation. The patient has evidence for further disease progression with metastatic lymphadenopathy in the neck and axilla. I will complete her staging workup by ordering a PET scan as well as MRI of the brain to rule out any other metastatic disease. I would consider the patient for systemic chemotherapy or immunotherapy because of her recent disease progression. 2) right upper extremity deep venous thrombosis: Continue current treatment with Lovenox. 3) left hip fracture: Orthopedic surgery recommended conservative treatment. The patient may benefit from physical therapy. Thank you so much for taking good care of Ms. Quentin Cornwall, I will continue to follow the patient with you and assist in her management.   LOS: 3 days    Telsa Dillavou K. 05/19/2013

## 2013-05-19 NOTE — Progress Notes (Signed)
Echocardiogram 2D Echocardiogram has been performed.  Meagan Garcia 05/19/2013, 11:32 AM

## 2013-05-19 NOTE — Progress Notes (Signed)
CSW met with patient & daughter, Meagan Garcia at bedside re: SNF bed offers. Patient has accepted bed offer @ [facility] SNF. Clinicals submitted for [employer] pre-authorization. Anticipating possible discharge.   Clinical Social Work Department CLINICAL SOCIAL WORK PLACEMENT NOTE 05/19/2013  Patient:  Meagan Garcia, Meagan Garcia  Account Number:  [id number] Admit date:  05/16/2013  Clinical Social Worker:  Renold Genta  Date/time:  05/17/2013 02:54 PM  Clinical Social Work is seeking post-discharge placement for this patient at the following level of care:   SKILLED NURSING   (*CSW will update this form in Epic as items are completed)   05/17/2013  Patient/family provided with [place] Department of Clinical Social Work's list of facilities offering this level of care within the geographic area requested by the patient (or if unable, by the patient's family).  05/17/2013  Patient/family informed of their freedom to choose among providers that offer the needed level of care, that participate in Medicare, Medicaid or managed care program needed by the patient, have an available bed and are willing to accept the patient.  05/17/2013  Patient/family informed of [facility]' ownership interest in [facility], as well as of the fact that they are under no obligation to receive care at this facility.  PASARR submitted to EDS on 05/17/2013 PASARR number received from EDS on 05/17/2013  FL2 transmitted to all facilities in geographic area requested by pt/family on  05/17/2013 FL2 transmitted to all facilities within larger geographic area on   Patient informed that his/her managed care company has contracts with or will negotiate with  certain facilities, including the following:   BCBS     Patient/family informed of bed offers received:  05/18/2013 Patient chooses bed at [facility] [place] Physician recommends and patient chooses bed at    Patient to  be transferred to [place] on   Patient to be transferred to facility by   The following physician request were entered in Epic:   Additional Comments:   Raynaldo Opitz, [place] Worker cell #: [phone]

## 2013-05-19 NOTE — Progress Notes (Signed)
Patient had 2 12 beat runs of Vtach. Asymptomatic. NP notified. New orders given for mag in the morning. Will continue to monitor patient.

## 2013-05-19 NOTE — Progress Notes (Addendum)
[facility] PROGRESS NOTE  Meagan Garcia AYT:[id number] DOB: 04-14-1950 DOA: 05/16/2013 PCP: [name], MD  Assessment/Plan: Pt is 63 y.o. female, with known history of lung cancer, followed by Dr. Earlie Server, status post chemotherapy and radiation therapy, who presented to [facility] ED with main concern of progressively worsening generalized weakness after an episode of fall she sustained at home, persistent and throbbing left hip pain post fall. She also explains she has had PICC line placed in January 2015, has history of lower extremity DVT and is on Lovenox but was not able to take it for three days as it was not available in pharmacy. In ED, US of the right upper extremity was significant for acute DVT and left hip xray was significant for non displaced fracture. [facility] asked to admit for further evaluation.   DVT (deep venous thrombosis) R upper extremity  - possibly provoked as pt has not been taking blood thinner at home  - Pt was on IV heparin; switch to Lovenox per pharmacy  - monitor closely for sings of bleeding  Left hip fracture  - Unclear who discussed case with the ED physician on the patient. I called Dr. Marlou Sa from orthopedic surgery today and ordered a CT scan of the hip, but he will look over the CT scan and decide whether this is nonoperative with outpatient follow up vs surgical approach.  - continue analgesia as needed for pain control  Acute respiratory failure  - unclear etiology, pt has some wheezing and rhonchi on exam  - placed on empiric ABX for now, BD's scheduled and as needed, solumedrol/taper  - follow up on sputum analysis  Leukocytosis  - possibly from DVT and ? PNA - ABS as noted above and repeat CBC in AM  Non-small cell cancer of right lung  - Dr. Earlie Server is aware of patient's hospitalization -  given fall and lung cancer we'll also obtain an MRI of the brain  Elevated troponin  - pt denies chest pain and no events on telemetry  - troponins trending  down, no need for further trending in the absence of chest pain,  - 2-D echo pending Anemia of chronic disease  - Hg and Hct stable and at baseline  - repeat CBC in AM  Thrombocytopenia  - no signs of active bleeding  - repeat CBC in AM   Consultants:  Ortho  Oncology  Antibiotics:  Zithromax 3/10 -->  Code Status: full Family Communication: d/w patient, her friend  Disposition Plan: pend clinical improvement, SNF  Consultants:  Orthopedic surgery  Oncology   Procedures:  None   HPI/Subjective: alert  Objective: Filed Vitals:   05/19/13 0425  BP: 123/65  Pulse: 98  Temp: 98.4 F (36.9 C)  Resp: 19    Intake/Output Summary (Last 24 hours) at 05/19/13 0804 Last data filed at 05/19/13 0600  Gross per 24 hour  Intake 1716.78 ml  Output      0 ml  Net 1716.78 ml   Filed Weights   05/16/13 1748 05/16/13 2231  Weight: 93.441 kg (206 lb) 89.2 kg (196 lb 10.4 oz)    Exam:   General:  alert  Cardiovascular: s1,s2 rrr  Respiratory: CAT BL  Abdomen: soft, nt,nd  Musculoskeletal: no LE edema    Data Reviewed: Basic Metabolic Panel:  Recent Labs Lab 05/16/13 1710 05/17/13 0345 05/18/13 0335 05/19/13 0335  NA 133* 133* 137  --   K 3.9 3.8 4.0  --   CL 94* 96 101  --  CO2 23 22 21   --   GLUCOSE 103* 94 126*  --   BUN 13 12 11   --   CREATININE 0.92 0.97 0.81  --   CALCIUM 9.9 9.5 9.5  --   MG  --   --   --  2.1   Liver Function Tests:  Recent Labs Lab 05/16/13 1710  AST 19  ALT 12  ALKPHOS 117  BILITOT 0.4  PROT 6.6  ALBUMIN 2.1*   CBC:  Recent Labs Lab 05/16/13 1710 05/17/13 0345 05/18/13 0335 05/18/13 1458  WBC 14.2* 11.6* 9.8 14.5*  HGB 10.2* 9.6* 9.2* 9.8*  HCT 31.4* 29.2* 28.9* 29.9*  MCV 92.9 93.3 95.1 93.7  PLT 119* 115* 139* 197   Cardiac Enzymes:  Recent Labs Lab 05/16/13 2330 05/17/13 0345 05/17/13 1035  TROPONINI 1.15* 0.84* 0.71*   BNP (last 3 results)  Recent Labs  03/07/13 1210  PROBNP 933.4*    Studies: No results found.  Scheduled Meds: . albuterol  2.5 mg Nebulization BID  . aspirin EC  81 mg Oral Daily  . azithromycin  500 mg Intravenous Q24H  . budesonide-formoterol  2 puff Inhalation BID  . buPROPion  150 mg Oral Daily  . cefTRIAXone (ROCEPHIN)  IV  1 g Intravenous Q24H  . cholecalciferol  1,000 Units Oral Daily  . enoxaparin (LOVENOX) injection  1 mg/kg Subcutaneous Q12H  . feeding supplement (ENSURE COMPLETE)  237 mL Oral Q24H  . methylPREDNISolone (SOLU-MEDROL) injection  40 mg Intravenous Daily  . nebivolol  10 mg Oral Daily  . sodium chloride  3 mL Intravenous Q12H  . tiotropium  18 mcg Inhalation Daily   Continuous Infusions:    Principal Problem:   DVT (deep venous thrombosis) Active Problems:   DEPRESSION   HYPERTENSION   Non-small cell cancer of right lung   Elevated troponin   Hip fracture, left   Protein-calorie malnutrition, severe  Time spent: 35 minutes   Marzetta Board MD [facility] Pager [phone]. If 7PM-7AM, please contact night-coverage at [email or web address], password [facility] 05/19/2013, 8:04 AM  LOS: 3 days

## 2013-05-20 DIAGNOSIS — I6340 Cerebral infarction due to embolism of unspecified cerebral artery: Secondary | ICD-10-CM

## 2013-05-20 LAB — CREATININE, SERUM
Creatinine, Ser: 0.85 mg/dL (ref 0.50–1.10)
GFR calc Af Amer: 84 mL/min — ABNORMAL LOW (ref >90)
GFR calc non Af Amer: 72 mL/min — ABNORMAL LOW (ref >90)

## 2013-05-20 LAB — HEMOGLOBIN A1C
Hgb A1c MFr Bld: 5.2 % (ref <5.7)
Mean Plasma Glucose: 103 mg/dL (ref <117)

## 2013-05-20 MED ORDER — MORPHINE SULFATE ER 30 MG PO TBCR
30.0000 mg | EXTENDED_RELEASE_TABLET | Freq: Two times a day (BID) | ORAL | Status: DC
  Administered 2013-05-20 – 2013-05-21 (×4): 30 mg via ORAL
  Filled 2013-05-20 (×4): qty 1

## 2013-05-20 NOTE — Progress Notes (Signed)
PT Cancellation Note  Patient Details Name: Meagan Garcia MRN: [id number] DOB: 06-Nov-1950   Cancelled Treatment:    Reason Eval/Treat Not Completed: Fatigue/lethargy limiting ability to participate Pt and family sleeping in room.  Pt opened eyes, looked at clock and then closed eyes again, did not speak when asked to participate in therapy.  Family members states she just got pain meds.   [name],[name] 05/20/2013, 2:29 PM [name], PT, DPT 05/20/2013 Pager: [phone]

## 2013-05-20 NOTE — Progress Notes (Signed)
[facility] PROGRESS NOTE  Meagan Garcia ZOX:[id number] DOB: [birth year] DOA: 05/16/2013 PCP: [name], MD  Assessment/Plan: Pt is 63 y.o. female, with known history of lung cancer, followed by Dr. Earlie Server, status post chemotherapy and radiation therapy, who presented to [facility] ED with main concern of progressively worsening generalized weakness after an episode of fall she sustained at home, persistent and throbbing left hip pain post fall. She also explains she has had PICC line placed in January 2015, has history of lower extremity DVT and is on Lovenox but was not able to take it for three days as it was not available in pharmacy. In ED, US of the right upper extremity was significant for acute DVT and left hip xray was significant for non displaced fracture. [facility] asked to admit for further evaluation.   Multiple bilateral CVAs - neurology consulted. Appreciate input. Recommending TEE, I have talked to cardiology to schedule a TEE for Monday. 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 No evidence of bleeding. Left hip fracture  - I discussed with Dr. Marlou Sa from orthopedic surgery on 3/12 and after reviewing CT scan of the hip it seems like her hip fracture is nonoperative, weight bearing as tolerated. - continue analgesia as needed for pain control  Acute respiratory failure  - unclear etiology, pt has some wheezing and rhonchi on exam  - placed on empiric ABX for now, BD's scheduled and as needed, solumedrol/taper  - follow up on sputum analysis  Leukocytosis  - possibly from DVT and ? PNA - ABS as noted above and repeat CBC in AM  Elevated troponin  - pt denies chest pain and no events on telemetry  - troponins trending down, no need for further trending in the absence of chest pain,  - Elevated troponin likely in setting of new CVAs Anemia of chronic disease  -  Hg and Hct stable and at baseline  - repeat CBC in AM  Thrombocytopenia  - no signs of active bleeding  - repeat CBC in AM   Consultants:  Orthopedic surgery (over the phone) Oncology Neurology Cardiology (for TEE)  Antibiotics:  Zithromax 3/10 >> Ceftriaxone 3/10 >>  Code Status: full Family Communication: d/w patient and family in the room   Disposition Plan: pend clinical improvement, SNF once CVA workup is complete including the TEE  Procedures:  2D echo  Carotid doppler  HPI/Subjective: Alert, without complaints  Objective: Filed Vitals:   05/20/13 0505  BP: 122/70  Pulse: 95  Temp: 98.1 F (36.7 C)  Resp: 18    Intake/Output Summary (Last 24 hours) at 05/20/13 1209 Last data filed at 05/20/13 0600  Gross per 24 hour  Intake   1050 ml  Output    450 ml  Net    600 ml   Filed Weights   05/16/13 1748 05/16/13 2231  Weight: 93.441 kg (206 lb) 89.2 kg (196 lb 10.4 oz)    Exam:   General:  alert  Cardiovascular: s1,s2 rrr  Respiratory: CAT BL  Abdomen: soft, nt,nd  Musculoskeletal: no LE edema    Data Reviewed: Basic Metabolic Panel:  Recent Labs Lab 05/16/13 1710 05/17/13 0345 05/18/13 0335 05/19/13 0335 05/20/13 0350  NA 133* 133* 137  --   --   K 3.9 3.8 4.0  --   --   CL 94* 96 101  --   --  CO2 23 22 21   --   --   GLUCOSE 103* 94 126*  --   --   BUN 13 12 11   --   --   CREATININE 0.92 0.97 0.81  --  0.85  CALCIUM 9.9 9.5 9.5  --   --   MG  --   --   --  2.1  --    Liver Function Tests:  Recent Labs Lab 05/16/13 1710  AST 19  ALT 12  ALKPHOS 117  BILITOT 0.4  PROT 6.6  ALBUMIN 2.1*   CBC:  Recent Labs Lab 05/16/13 1710 05/17/13 0345 05/18/13 0335 05/18/13 1458  WBC 14.2* 11.6* 9.8 14.5*  HGB 10.2* 9.6* 9.2* 9.8*  HCT 31.4* 29.2* 28.9* 29.9*  MCV 92.9 93.3 95.1 93.7  PLT 119* 115* 139* 197   Cardiac Enzymes:  Recent Labs Lab 05/16/13 2330 05/17/13 0345 05/17/13 1035  TROPONINI 1.15* 0.84* 0.71*    BNP (last 3 results)  Recent Labs  03/07/13 1210  PROBNP 933.4*   Studies: Mr Jodene Nam Head Wo Contrast  05/19/2013   CLINICAL DATA:  Non-small-cell lung cancer.  EXAM: MRI HEAD WITHOUT AND WITH CONTRAST  MRA HEAD WITHOUT CONTRAST  TECHNIQUE: Multiplanar, multiecho pulse sequences of the brain and surrounding structures were obtained without and with intravenous contrast. Angiographic images of the head were obtained using MRA technique without contrast.  CONTRAST:  28mL MULTIHANCE GADOBENATE DIMEGLUMINE 529 MG/ML IV SOLN  COMPARISON:  MR HEAD WO/W CM dated 03/15/2013  FINDINGS: MRI HEAD FINDINGS  The diffusion-weighted images demonstrate scattered areas of acute nonhemorrhagic infarction. There multiple punctate areas within the cerebellum bilaterally. A linear focus is present in the right occipital lobe. Additional areas are present in the frontal lobes bilaterally, more anteriorly on the right.  T2 changes are associated with the areas of acute/subacute infarction. These changes were not present previously. There is no evidence for hemorrhage.  Abnormal signal is present in the proximal left vertebral artery. Flow is otherwise present within the major intracranial arteries. The globes and orbits are intact. The paranasal sinuses and mastoid air cells are clear.  Postcontrast images demonstrate a 5 mm lesion in the medial anterior left frontal lobe compatible with a focal metastasis. Skull lesion is present within the squamous portion of the right temporal bone with a extension into the dura adjacent to the right temporal tip. The lesion measures 2.0 x 1.8 x 1.4 cm.  MRA HEAD FINDINGS  The study is mildly degraded by patient motion. Internal carotid arteries demonstrate mild irregularity through the cavernous segment bilaterally without significant stenosis. There is moderate segmental irregularity in the hypoplastic left A1 segment. The right A1 segment is dominant. The anterior communicating artery is  patent. There is segmental narrowing of ACA and MCA branch vessels bilaterally without a proximal stenosis or occlusion.  The left vertebral artery is occluded. The right vertebral artery is within normal limits. The PICA vessels are not seen. The basilar artery is within normal limits. Both posterior cerebral arteries originate from the basilar tip. There is some attenuation of PCA branch vessels bilaterally.  IMPRESSION: 1. Multiple bilateral nonhemorrhagic infarcts involving both the anterior and posterior circulation as described. 2. Right mm medial left frontal lobe metastasis is new. 3. 2.0 x 1.8 x 1.4 cm lesion of the squamous portion of the right temporal bone with dural extension in the right middle cranial fossa. 4. 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 5. Moderate small  vessel disease throughout.  Critical Value/emergent results were called by telephone at the time of interpretation on 05/19/2013 at 4:59 PM to Dr. Marzetta Board , who verbally acknowledged these results.   Electronically Signed   By: Lawrence Santiago M.D.   On: 05/19/2013 16:59   Mr [name] YH Contrast  05/19/2013   CLINICAL DATA:  Non-small-cell lung cancer.  EXAM: MRI HEAD WITHOUT AND WITH CONTRAST  MRA HEAD WITHOUT CONTRAST  TECHNIQUE: Multiplanar, multiecho pulse sequences of the brain and surrounding structures were obtained without and with intravenous contrast. Angiographic images of the head were obtained using MRA technique without contrast.  CONTRAST:  15mL MULTIHANCE GADOBENATE DIMEGLUMINE 529 MG/ML IV SOLN  COMPARISON:  MR HEAD WO/W CM dated 03/15/2013  FINDINGS: MRI HEAD FINDINGS  The diffusion-weighted images demonstrate scattered areas of acute nonhemorrhagic infarction. There multiple punctate areas within the cerebellum bilaterally. A linear focus is present in the right occipital lobe. Additional areas are present in the frontal lobes bilaterally, more anteriorly on the right.  T2  changes are associated with the areas of acute/subacute infarction. These changes were not present previously. There is no evidence for hemorrhage.  Abnormal signal is present in the proximal left vertebral artery. Flow is otherwise present within the major intracranial arteries. The globes and orbits are intact. The paranasal sinuses and mastoid air cells are clear.  Postcontrast images demonstrate a 5 mm lesion in the medial anterior left frontal lobe compatible with a focal metastasis. Skull lesion is present within the squamous portion of the right temporal bone with a extension into the dura adjacent to the right temporal tip. The lesion measures 2.0 x 1.8 x 1.4 cm.  MRA HEAD FINDINGS  The study is mildly degraded by patient motion. Internal carotid arteries demonstrate mild irregularity through the cavernous segment bilaterally without significant stenosis. There is moderate segmental irregularity in the hypoplastic left A1 segment. The right A1 segment is dominant. The anterior communicating artery is patent. There is segmental narrowing of ACA and MCA branch vessels bilaterally without a proximal stenosis or occlusion.  The left vertebral artery is occluded. The right vertebral artery is within normal limits. The PICA vessels are not seen. The basilar artery is within normal limits. Both posterior cerebral arteries originate from the basilar tip. There is some attenuation of PCA branch vessels bilaterally.  IMPRESSION: 1. Multiple bilateral nonhemorrhagic infarcts involving both the anterior and posterior circulation as described. 2. Right mm medial left frontal lobe metastasis is new. 3. 2.0 x 1.8 x 1.4 cm lesion of the squamous portion of the right temporal bone with dural extension in the right middle cranial fossa. 4. 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 5. Moderate small vessel disease throughout.  Critical Value/emergent results were called by telephone at the  time of interpretation on 05/19/2013 at 4:59 PM to Dr. Marzetta Board , who verbally acknowledged these results.   Electronically Signed   By: Lawrence Santiago M.D.   On: 05/19/2013 16:59   Ct Hip Left Wo Contrast  05/19/2013   CLINICAL DATA:  Left hip pain secondary to a fall. Abnormal radiograph on 05/16/2013 suggests a small avulsion of the left greater trochanter.  EXAM: CT OF THE LEFT HIP WITHOUT CONTRAST  TECHNIQUE: Multidetector CT imaging was performed according to the standard protocol. Multiplanar CT image reconstructions were also generated.  COMPARISON:  Radiographs dated 05/16/2013  FINDINGS: There is a minimally displaced fracture of the tip of the left  greater trochanter at the insertion of the left gluteus minimus tendon. The left femur is otherwise intact. The visualized pelvic bones are normal.  Note is made of a 12 mm soft tissue density in the left inguinal canal, probably a lymph node. There is also a 10 mm lymph node in the left inguinal region on image number 43 of series 3. These are nonspecific.  The patient has degenerative disc disease at L5-S1. There are multiple diverticula in the distal colon.  IMPRESSION: 1. Small avulsion fracture of the tip of the greater trochanter of the proximal left femur. 2. Small lymph nodes in the left inguinal canal and left groin, nonspecific.   Electronically Signed   By: Rozetta Nunnery M.D.   On: 05/19/2013 10:18    Scheduled Meds: . albuterol  2.5 mg Nebulization BID  . aspirin EC  81 mg Oral Daily  . azithromycin  500 mg Intravenous Q24H  . budesonide-formoterol  2 puff Inhalation BID  . buPROPion  150 mg Oral Daily  . cefTRIAXone (ROCEPHIN)  IV  1 g Intravenous Q24H  . cholecalciferol  1,000 Units Oral Daily  . enoxaparin (LOVENOX) injection  1 mg/kg Subcutaneous Q12H  . feeding supplement (ENSURE COMPLETE)  237 mL Oral Q24H  . morphine  30 mg Oral Q12H  . nebivolol  10 mg Oral Daily  . predniSONE  20 mg Oral Q breakfast  . sodium chloride   3 mL Intravenous Q12H  . tiotropium  18 mcg Inhalation Daily   Continuous Infusions:    Principal Problem:   DVT (deep venous thrombosis) Active Problems:   DEPRESSION   HYPERTENSION   Non-small cell cancer of right lung   Elevated troponin   Hip fracture, left   Protein-calorie malnutrition, severe  Time spent: 35 minutes   Marzetta Board MD [facility] Pager [phone]. If 7PM-7AM, please contact night-coverage at [email or web address], password [facility] 05/20/2013, 12:09 PM  LOS: 4 days

## 2013-05-20 NOTE — Progress Notes (Addendum)
*  PRELIMINARY RESULTS* Vascular Ultrasound Carotid Duplex (Doppler) has been completed.  Preliminary findings: Bilaterally 1-39% ICA stenosis. Right vertebral is patent and antegrade. Left vertebral is atypical with loss of diastolic component of waveform.    Landry Mellow, RDMS, RVT  05/20/2013, 10:06 AM

## 2013-05-20 NOTE — Consult Note (Signed)
Referring Physician: Cruzita Lederer    Chief Complaint: bilateral strokes  HPI:                                                                                                                                         Meagan Garcia is an 63 y.o. female with known non-small cell lung CA status post Chemo and radiation who presented to [facility] ED 3.9.15 due to feeling generalized weak on Sunday and fall at home. Patient also was on Lovenox at home for LE DVT but missed 3 days prior to hospitalization due to not being available in pharmacy. In ED she was noted to also have a DVT in her R UE and a non displaced fracture of her left hip. Patient states she awoke on Sunday morning prior to hospitalization feeling week in both legs but did not note any localizing or lateralizing abnormality. The only specific finding her daughter sees is that she is slightly slower in mentation.    Currently on Lovenox 90 mg [place] BID PLT 197  Date last known well: Date: 05/14/2013 Time last known well: Unable to determine tPA Given: No: recent surgery and out of window  Past Medical History  Diagnosis Date  . Anxiety   . Depression   . Peripheral neuropathy     feet: unknown etiol  . Elevated glucose   . Allergic rhinitis   . GERD (gastroesophageal reflux disease)   . Complication of anesthesia w/tubal    got anest. "went crazy"   . Non-small cell lung cancer     Past Surgical History  Procedure Laterality Date  . Abdominal hysterectomy      partial  . Tubal reversal    . Tubal ligation      x2  . Fibroid tumors    . Knee arthroscopy    . Foot surgery      cysts from both feet  . Video bronchoscopy Bilateral 02/21/2013    Procedure: VIDEO BRONCHOSCOPY WITHOUT FLUORO;  Surgeon: Doree Fudge, MD;  Location: [facility];  Service: Cardiopulmonary;  Laterality: Bilateral;    Family History  Problem Relation Age of Onset  . Hypertension Other   . Hypertension Mother   . Cancer Mother 53    lung  ca  . Cancer Father    Social History:  reports that she quit smoking about 17 years ago. She has never used smokeless tobacco. She reports that she does not drink alcohol or use illicit drugs.  Allergies: No Known Allergies  Medications:  Prior to Admission:  Prescriptions prior to admission  Medication Sig Dispense Refill  . albuterol (PROVENTIL HFA;VENTOLIN HFA) 108 (90 BASE) MCG/ACT inhaler Inhale 2 puffs into the lungs every 6 (six) hours as needed for wheezing or shortness of breath.  1 Inhaler  2  . budesonide-formoterol (SYMBICORT) 160-4.5 MCG/ACT inhaler Inhale 2 puffs into the lungs 2 (two) times daily.  1 Inhaler  11  . buPROPion (ZYBAN) 150 MG 12 hr tablet Take 150 mg by mouth daily.      Marland Kitchen buPROPion (ZYBAN) 150 MG 12 hr tablet Take 150 mg by mouth daily.      . cholecalciferol (VITAMIN D) 1000 UNITS tablet Take 1 tablet (1,000 Units total) by mouth daily.  30 tablet  0  . docusate sodium (COLACE) 100 MG capsule Take 200 mg by mouth daily.       Marland Kitchen enoxaparin (LOVENOX) 150 MG/ML injection Inject 150 mg into the skin daily.      . fluticasone (FLONASE) 50 MCG/ACT nasal spray Place 2 sprays into the nose daily.  48 g  3  . hyaluronate sodium (RADIAPLEXRX) GEL Apply 1 application topically 2 (two) times daily. Apply to chest area being treated with radiation after rad tx and bedtime when skin becomes itchy or irritation/erythema  And on weekedns as well      . LORazepam (ATIVAN) 1 MG tablet TAKE ONE TABLET BY MOUTH ONCE DAILY      . LORazepam (ATIVAN) 1 MG tablet Take 1 mg by mouth at bedtime.      . nebivolol (BYSTOLIC) 10 MG tablet Take 1 tablet (10 mg total) by mouth daily.  30 tablet  11  . prochlorperazine (COMPAZINE) 10 MG tablet Take 1 tablet (10 mg total) by mouth every 6 (six) hours as needed for nausea or vomiting.  60 tablet  0  . tiotropium (SPIRIVA  HANDIHALER) 18 MCG inhalation capsule Place 1 capsule (18 mcg total) into inhaler and inhale daily.  30 capsule  11  . HYDROcodone-homatropine (HYCODAN) 5-1.5 MG/5ML syrup Take 5 mLs by mouth every 6 (six) hours as needed for cough.  240 mL  0   Scheduled: . albuterol  2.5 mg Nebulization BID  . aspirin EC  81 mg Oral Daily  . azithromycin  500 mg Intravenous Q24H  . budesonide-formoterol  2 puff Inhalation BID  . buPROPion  150 mg Oral Daily  . cefTRIAXone (ROCEPHIN)  IV  1 g Intravenous Q24H  . cholecalciferol  1,000 Units Oral Daily  . enoxaparin (LOVENOX) injection  1 mg/kg Subcutaneous Q12H  . feeding supplement (ENSURE COMPLETE)  237 mL Oral Q24H  . morphine  30 mg Oral Q12H  . nebivolol  10 mg Oral Daily  . predniSONE  20 mg Oral Q breakfast  . sodium chloride  3 mL Intravenous Q12H  . tiotropium  18 mcg Inhalation Daily    ROS:  History obtained from the patient  General ROS: negative for - chills, fatigue, fever, night sweats, weight gain or weight loss Psychological ROS: negative for - behavioral disorder, hallucinations, memory difficulties, mood swings or suicidal ideation Ophthalmic ROS: negative for - blurry vision, double vision, eye pain or loss of vision ENT ROS: negative for - epistaxis, nasal discharge, oral lesions, sore throat, tinnitus or vertigo Allergy and Immunology ROS: negative for - hives or itchy/watery eyes Hematological and Lymphatic ROS: negative for - bleeding problems, bruising or swollen lymph nodes Endocrine ROS: negative for - galactorrhea, hair pattern changes, polydipsia/polyuria or temperature intolerance Respiratory ROS: negative for - cough, hemoptysis, shortness of breath or wheezing Cardiovascular ROS: negative for - chest pain, dyspnea on exertion, edema or irregular heartbeat Gastrointestinal ROS: negative  for - abdominal pain, diarrhea, hematemesis, nausea/vomiting or stool incontinence Genito-Urinary ROS: negative for - dysuria, hematuria, incontinence or urinary frequency/urgency Musculoskeletal ROS: negative for - joint swelling or muscular weakness Neurological ROS: as noted in HPI Dermatological ROS: negative for rash and skin lesion changes  Neurologic Examination:                                                                                                      Blood pressure 122/70, pulse 95, temperature 98.1 F (36.7 C), temperature source Oral, resp. rate 18, height 5' 3"  (1.6 m), weight 89.2 kg (196 lb 10.4 oz), SpO2 95.00%.   Exam limited due to pain in right UE and left LE  Mental Status: Alert, oriented, thought content appropriate.  Speech fluent without evidence of aphasia.  Able to follow 3 step commands without difficulty. Cranial Nerves: II: Discs flat bilaterally; Visual fields grossly normal, pupils equal, round, reactive to light and accommodation III,IV, VI: ptosis not present, extra-ocular motions intact bilaterally V,VII: smile symmetric, facial light touch sensation normal bilaterally VIII: hearing normal bilaterally IX,X: gag reflex present XI: bilateral shoulder shrug XII: midline tongue extension without atrophy or fasciculations  Motor: Right : Upper extremity   4/5    Left:     Upper extremity   5/5  Lower extremity   5/5     Lower extremity   4/5 (pain in hip) Tone and bulk:normal tone throughout; no atrophy noted Sensory: Pinprick normal bilaterally and light touch decreased on the right leg Deep Tendon Reflexes:  Right: Upper Extremity   Left: Upper extremity   biceps (C-5 to C-6) 1/4   biceps (C-5 to C-6) 2/4 tricep (C7) 1/4    triceps (C7) 2/4 Brachioradialis (C6) 1/4  Brachioradialis (C6) 2/4  Lower Extremity Lower Extremity  quadriceps (L-2 to L-4) 2/4   quadriceps (L-2 to L-4) 2/4 Achilles (S1) 1/4   Achilles (S1) 1/4  Plantars: Right:  downgoing   Left: downgoing Cerebellar: normal finger-to-nose,  normal heel-to-shin test Gait: not tested CV: pulses palpable throughout    Lab Results: Basic Metabolic Panel:  Recent Labs Lab 05/16/13 1710 05/17/13 0345 05/18/13 0335 05/19/13 0335 05/20/13 0350  NA 133* 133* 137  --   --   K 3.9 3.8 4.0  --   --  CL 94* 96 101  --   --   CO2 23 22 21   --   --   GLUCOSE 103* 94 126*  --   --   BUN 13 12 11   --   --   CREATININE 0.92 0.97 0.81  --  0.85  CALCIUM 9.9 9.5 9.5  --   --   MG  --   --   --  2.1  --     Liver Function Tests:  Recent Labs Lab 05/16/13 1710  AST 19  ALT 12  ALKPHOS 117  BILITOT 0.4  PROT 6.6  ALBUMIN 2.1*   No results found for this basename: LIPASE, AMYLASE,  in the last 168 hours No results found for this basename: AMMONIA,  in the last 168 hours  CBC:  Recent Labs Lab 05/16/13 1710 05/17/13 0345 05/18/13 0335 05/18/13 1458  WBC 14.2* 11.6* 9.8 14.5*  HGB 10.2* 9.6* 9.2* 9.8*  HCT 31.4* 29.2* 28.9* 29.9*  MCV 92.9 93.3 95.1 93.7  PLT 119* 115* 139* 197    Cardiac Enzymes:  Recent Labs Lab 05/16/13 2330 05/17/13 0345 05/17/13 1035  TROPONINI 1.15* 0.84* 0.71*    Lipid Panel: No results found for this basename: CHOL, TRIG, HDL, CHOLHDL, VLDL, LDLCALC,  in the last 168 hours  CBG: No results found for this basename: GLUCAP,  in the last 168 hours  Microbiology: Results for orders placed in visit on 04/04/13  TECHNOLOGIST REVIEW     Status: None   Collection Time    04/04/13 10:08 AM      Result Value Ref Range Status   Technologist Review Oc myelocyte   Final    Coagulation Studies: No results found for this basename: LABPROT, INR,  in the last 72 hours  Imaging: Mr Jodene Nam Head Wo Contrast  05/19/2013   CLINICAL DATA:  Non-small-cell lung cancer.  EXAM: MRI HEAD WITHOUT AND WITH CONTRAST  MRA HEAD WITHOUT CONTRAST  TECHNIQUE: Multiplanar, multiecho pulse sequences of the brain and surrounding structures were  obtained without and with intravenous contrast. Angiographic images of the head were obtained using MRA technique without contrast.  CONTRAST:  80m MULTIHANCE GADOBENATE DIMEGLUMINE 529 MG/ML IV SOLN  COMPARISON:  MR HEAD WO/W CM dated 03/15/2013  FINDINGS: MRI HEAD FINDINGS  The diffusion-weighted images demonstrate scattered areas of acute nonhemorrhagic infarction. There multiple punctate areas within the cerebellum bilaterally. A linear focus is present in the right occipital lobe. Additional areas are present in the frontal lobes bilaterally, more anteriorly on the right.  T2 changes are associated with the areas of acute/subacute infarction. These changes were not present previously. There is no evidence for hemorrhage.  Abnormal signal is present in the proximal left vertebral artery. Flow is otherwise present within the major intracranial arteries. The globes and orbits are intact. The paranasal sinuses and mastoid air cells are clear.  Postcontrast images demonstrate a 5 mm lesion in the medial anterior left frontal lobe compatible with a focal metastasis. Skull lesion is present within the squamous portion of the right temporal bone with a extension into the dura adjacent to the right temporal tip. The lesion measures 2.0 x 1.8 x 1.4 cm.  MRA HEAD FINDINGS  The study is mildly degraded by patient motion. Internal carotid arteries demonstrate mild irregularity through the cavernous segment bilaterally without significant stenosis. There is moderate segmental irregularity in the hypoplastic left A1 segment. The right A1 segment is dominant. The anterior communicating artery is patent. There is  segmental narrowing of ACA and MCA branch vessels bilaterally without a proximal stenosis or occlusion.  The left vertebral artery is occluded. The right vertebral artery is within normal limits. The PICA vessels are not seen. The basilar artery is within normal limits. Both posterior cerebral arteries originate from  the basilar tip. There is some attenuation of PCA branch vessels bilaterally.  IMPRESSION: 1. Multiple bilateral nonhemorrhagic infarcts involving both the anterior and posterior circulation as described. 2. Right mm medial left frontal lobe metastasis is new. 3. 2.0 x 1.8 x 1.4 cm lesion of the squamous portion of the right temporal bone with dural extension in the right middle cranial fossa. 4. 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 5. Moderate small vessel disease throughout.  Critical Value/emergent results were called by telephone at the time of interpretation on 05/19/2013 at 4:59 PM to Dr. Marzetta Board , who verbally acknowledged these results.   Electronically Signed   By: Lawrence Santiago M.D.   On: 05/19/2013 16:59   Mr [name] JJ Contrast  05/19/2013   CLINICAL DATA:  Non-small-cell lung cancer.  EXAM: MRI HEAD WITHOUT AND WITH CONTRAST  MRA HEAD WITHOUT CONTRAST  TECHNIQUE: Multiplanar, multiecho pulse sequences of the brain and surrounding structures were obtained without and with intravenous contrast. Angiographic images of the head were obtained using MRA technique without contrast.  CONTRAST:  64m MULTIHANCE GADOBENATE DIMEGLUMINE 529 MG/ML IV SOLN  COMPARISON:  MR HEAD WO/W CM dated 03/15/2013  FINDINGS: MRI HEAD FINDINGS  The diffusion-weighted images demonstrate scattered areas of acute nonhemorrhagic infarction. There multiple punctate areas within the cerebellum bilaterally. A linear focus is present in the right occipital lobe. Additional areas are present in the frontal lobes bilaterally, more anteriorly on the right.  T2 changes are associated with the areas of acute/subacute infarction. These changes were not present previously. There is no evidence for hemorrhage.  Abnormal signal is present in the proximal left vertebral artery. Flow is otherwise present within the major intracranial arteries. The globes and orbits are intact. The paranasal sinuses  and mastoid air cells are clear.  Postcontrast images demonstrate a 5 mm lesion in the medial anterior left frontal lobe compatible with a focal metastasis. Skull lesion is present within the squamous portion of the right temporal bone with a extension into the dura adjacent to the right temporal tip. The lesion measures 2.0 x 1.8 x 1.4 cm.  MRA HEAD FINDINGS  The study is mildly degraded by patient motion. Internal carotid arteries demonstrate mild irregularity through the cavernous segment bilaterally without significant stenosis. There is moderate segmental irregularity in the hypoplastic left A1 segment. The right A1 segment is dominant. The anterior communicating artery is patent. There is segmental narrowing of ACA and MCA branch vessels bilaterally without a proximal stenosis or occlusion.  The left vertebral artery is occluded. The right vertebral artery is within normal limits. The PICA vessels are not seen. The basilar artery is within normal limits. Both posterior cerebral arteries originate from the basilar tip. There is some attenuation of PCA branch vessels bilaterally.  IMPRESSION: 1. Multiple bilateral nonhemorrhagic infarcts involving both the anterior and posterior circulation as described. 2. Right mm medial left frontal lobe metastasis is new. 3. 2.0 x 1.8 x 1.4 cm lesion of the squamous portion of the right temporal bone with dural extension in the right middle cranial fossa. 4. 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 5. Moderate small  vessel disease throughout.  Critical Value/emergent results were called by telephone at the time of interpretation on 05/19/2013 at 4:59 PM to Dr. Marzetta Board , who verbally acknowledged these results.   Electronically Signed   By: Lawrence Santiago M.D.   On: 05/19/2013 16:59   Ct Hip Left Wo Contrast  05/19/2013   CLINICAL DATA:  Left hip pain secondary to a fall. Abnormal radiograph on 05/16/2013 suggests a small avulsion  of the left greater trochanter.  EXAM: CT OF THE LEFT HIP WITHOUT CONTRAST  TECHNIQUE: Multidetector CT imaging was performed according to the standard protocol. Multiplanar CT image reconstructions were also generated.  COMPARISON:  Radiographs dated 05/16/2013  FINDINGS: There is a minimally displaced fracture of the tip of the left greater trochanter at the insertion of the left gluteus minimus tendon. The left femur is otherwise intact. The visualized pelvic bones are normal.  Note is made of a 12 mm soft tissue density in the left inguinal canal, probably a lymph node. There is also a 10 mm lymph node in the left inguinal region on image number 43 of series 3. These are nonspecific.  The patient has degenerative disc disease at L5-S1. There are multiple diverticula in the distal colon.  IMPRESSION: 1. Small avulsion fracture of the tip of the greater trochanter of the proximal left femur. 2. Small lymph nodes in the left inguinal canal and left groin, nonspecific.   Electronically Signed   By: Rozetta Nunnery M.D.   On: 05/19/2013 10:18    2D echo Study Conclusions  - Left ventricle: The cavity size was normal. Systolic function was normal. The estimated ejection fraction was in the range of 55% to 60%. Wall motion was normal; there were no regional wall motion abnormalities. Doppler parameters are consistent with abnormal left ventricular relaxation (grade 1 diastolic dysfunction). - Mitral valve: Mild regurgitation. - Tricuspid valve: Mild-moderate regurgitation. - Pulmonary arteries: Systolic pressure was mildly to moderately increased. PA peak pressure: 30m Hg (S).   DEtta QuillPA-C [facility] [phone] 05/20/2013, 8:53 AM   Patient seen and examined.  Clinical course and management discussed.  Necessary edits performed.  I agree with the above.  Assessment and plan of care developed and discussed below.     Assessment: 63y.o. female presenting with complaints of  weakness.  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  Patient had been off anticoagulation prior to admission and does have DVT.  For this to be the etiology a PFO would have to be hypothesized.  Patient also hypercoaguable secondary to carcinoma.  Can not rule out an intracardiac thrombus.  MRA shows what is likely an occluded left vertebral.  Patient clinically with some mild extremity weakness.  With time since presentation and minimal symptoms patient would not be a tPA or intervention candidate.   TTE unremarkable.  Carotid dopplers show no hemodynamically significant stenosis in the anterior circulation.  LDL, A1c pending.  Therapy has evaluated the patient.  Stroke Risk Factors - none  Recommendations: 1.  Would continue anticoagulation with results of full testing to be considered in determination of long term choice 2.  Telemetry 3.  Frequent neuro checks 4.  TEE 5.  Patient with what is felt to be a new met on MRI as well.  Oncology to be involved.    LAlexis Goodell MD [facility] [phone] 05/20/2013  1:31 PM

## 2013-05-21 LAB — BASIC METABOLIC PANEL
BUN: 15 mg/dL (ref 6–23)
CHLORIDE: 103 meq/L (ref 96–112)
CO2: 25 meq/L (ref 19–32)
Calcium: 10.3 mg/dL (ref 8.4–10.5)
Creatinine, Ser: 0.8 mg/dL (ref 0.50–1.10)
GFR calc non Af Amer: 78 mL/min — ABNORMAL LOW (ref >90)
Glucose, Bld: 88 mg/dL (ref 70–99)
Potassium: 4.4 mEq/L (ref 3.7–5.3)
SODIUM: 140 meq/L (ref 137–147)

## 2013-05-21 LAB — CBC
HCT: 30.6 % — ABNORMAL LOW (ref 36.0–46.0)
Hemoglobin: 9.6 g/dL — ABNORMAL LOW (ref 12.0–15.0)
MCH: 30.5 pg (ref 26.0–34.0)
MCHC: 31.4 g/dL (ref 30.0–36.0)
MCV: 97.1 fL (ref 78.0–100.0)
Platelets: 229 10*3/uL (ref 150–400)
RBC: 3.15 MIL/uL — ABNORMAL LOW (ref 3.87–5.11)
RDW: 20 % — ABNORMAL HIGH (ref 11.5–15.5)
WBC: 12.5 10*3/uL — ABNORMAL HIGH (ref 4.0–10.5)

## 2013-05-21 LAB — LIPID PANEL
Cholesterol: 172 mg/dL (ref 0–200)
HDL: 44 mg/dL (ref >39)
LDL Cholesterol: 97 mg/dL (ref 0–99)
TRIGLYCERIDES: 153 mg/dL — AB (ref <150)
Total CHOL/HDL Ratio: 3.9 RATIO
VLDL: 31 mg/dL (ref 0–40)

## 2013-05-21 NOTE — Progress Notes (Addendum)
ANTICOAGULATION CONSULT NOTE - Follow Up  Pharmacy Consult for Lovenox Indication: DVT  No Known Allergies  Patient Measurements: Height: 5\' 3"  (160 cm) Weight: 196 lb 10.4 oz (89.2 kg) IBW/kg (Calculated) : 52.4  Vital Signs: Temp: 97.9 F (36.6 C) (03/14 0535) Temp src: Oral (03/14 0535) BP: 106/53 mmHg (03/14 0535) Pulse Rate: 102 (03/14 0535)  Labs:  Recent Labs  05/18/13 1458 05/20/13 0350 05/21/13 0518  HGB 9.8*  --  9.6*  HCT 29.9*  --  30.6*  PLT 197  --  229  CREATININE  --  0.85 0.80    Estimated Creatinine Clearance: 78.2 ml/min (by C-G formula based on Cr of 0.8).   Medical History: Past Medical History  Diagnosis Date  . Anxiety   . Depression   . Peripheral neuropathy     feet: unknown etiol  . Elevated glucose   . Allergic rhinitis   . GERD (gastroesophageal reflux disease)   . Complication of anesthesia w/tubal    got anest. "went crazy"   . 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  Pharmacy consulted to start heparin.  Last dose of lovenox 150mg  daily 3/6 per (missed 3 days since out of stock at outpatient pharmacy per patient).   MD ordered to transition back to Lovenox 3/11.  Lovenox 90 mg (1 mg/kg) SQ q12h.  Weight 89.2 kg  SCr stable, CrCl~78 ml/min  Hgb low but stable, platelets 229K  No bleeding reported.  Patient also found to have multiple bilateral CVAs.  Goal of Therapy:  Anti-Xa level 0.6-1 unit/ml Monitor platelets by anticoagulation protocol: Yes   Plan:  1.  Continue Lovenox 90 mg (1 mg/kg) SQ q12h.  2.  Check CBC at least every 72 hours. 3.  Pharmacy will continue to monitor.  Hershal Coria, PharmD, BCPS Pager: [phone] 05/21/2013 9:50 AM

## 2013-05-21 NOTE — Progress Notes (Signed)
[facility] PROGRESS NOTE  YAELI HARTUNG UUV:[id number] DOB: Apr 26, 1950 DOA: 05/16/2013 PCP: [name], MD  Assessment/Plan: Pt is 63 y.o. female, with known history of lung cancer, followed by Dr. Earlie Server, status post chemotherapy and radiation therapy, who presented to [facility] ED with main concern of progressively worsening generalized weakness after an episode of fall she sustained at home, persistent and throbbing left hip pain post fall. She also explains she has had PICC line placed in January 2015, has history of lower extremity DVT and is on Lovenox but was not able to take it for three days as it was not available in pharmacy. In ED, US of the right upper extremity was significant for acute DVT and left hip xray was significant for non displaced fracture. [facility] asked to admit for further evaluation.   Multiple bilateral CVAs - neurology consulted. Appreciate input. Recommending TEE, I have talked to cardiology to schedule a TEE for Monday. 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 No evidence of bleeding. Left hip fracture  - I discussed with Dr. Marlou Sa from orthopedic surgery on 3/12 and after reviewing CT scan of the hip it seems like her hip fracture is non operative and has good outcomes with conservative treatment, weight bearing as tolerated. - continue analgesia as needed for pain control  - will need SNF at discharge, patient agreeable. Acute respiratory failure  - unclear etiology, pt has some wheezing and rhonchi on exam  - placed on empiric ABX for now, BD's scheduled and as needed, solumedrol/taper  - follow up on sputum analysis  Leukocytosis  - possibly from DVT and ? PNA - ABS as noted above and repeat CBC in AM  Elevated troponin  - pt denies chest pain and no events on telemetry  - troponins trending  down, no need for further trending in the absence of chest pain,  - Elevated troponin likely in setting of new CVAs Anemia of chronic disease  - Hg and Hct stable and at baseline  - repeat CBC in AM  Thrombocytopenia  - no signs of active bleeding  -resolved  Consultants:  Orthopedic surgery (over the phone) Oncology Neurology Cardiology (for TEE)  Antibiotics:  Zithromax 3/10 >> Ceftriaxone 3/10 >>  Code Status: full Family Communication: d/w patient and family in the room   Disposition Plan: pend clinical improvement, SNF once CVA workup is complete including the TEE  Procedures:  2D echo  Carotid doppler  HPI/Subjective: - has no complaints this morning, she feels somewhat sleepy. Wants to be outside for few minutes  Objective: Filed Vitals:   05/21/13 0535  BP: 106/53  Pulse: 102  Temp: 97.9 F (36.6 C)  Resp: 18    Intake/Output Summary (Last 24 hours) at 05/21/13 1047 Last data filed at 05/21/13 0600  Gross per 24 hour  Intake    660 ml  Output      0 ml  Net    660 ml   Filed Weights   05/16/13 1748 05/16/13 2231  Weight: 93.441 kg (206 lb) 89.2 kg (196 lb 10.4 oz)   Exam:  General:  alert  Cardiovascular: s1,s2 rrr  Respiratory: CAT BL  Abdomen: soft, nt,nd  Musculoskeletal: no LE edema    Data Reviewed: Basic Metabolic Panel:  Recent Labs Lab 05/16/13 1710 05/17/13 0345 05/18/13 0335 05/19/13 0335 05/20/13 0350 05/21/13  0518  NA 133* 133* 137  --   --  140  K 3.9 3.8 4.0  --   --  4.4  CL 94* 96 101  --   --  103  CO2 23 22 21   --   --  25  GLUCOSE 103* 94 126*  --   --  88  BUN 13 12 11   --   --  15  CREATININE 0.92 0.97 0.81  --  0.85 0.80  CALCIUM 9.9 9.5 9.5  --   --  10.3  MG  --   --   --  2.1  --   --    Liver Function Tests:  Recent Labs Lab 05/16/13 1710  AST 19  ALT 12  ALKPHOS 117  BILITOT 0.4  PROT 6.6  ALBUMIN 2.1*   CBC:  Recent Labs Lab 05/16/13 1710 05/17/13 0345 05/18/13 0335  05/18/13 1458 05/21/13 0518  WBC 14.2* 11.6* 9.8 14.5* 12.5*  HGB 10.2* 9.6* 9.2* 9.8* 9.6*  HCT 31.4* 29.2* 28.9* 29.9* 30.6*  MCV 92.9 93.3 95.1 93.7 97.1  PLT 119* 115* 139* 197 229   Cardiac Enzymes:  Recent Labs Lab 05/16/13 2330 05/17/13 0345 05/17/13 1035  TROPONINI 1.15* 0.84* 0.71*   BNP (last 3 results)  Recent Labs  03/07/13 1210  PROBNP 933.4*   Studies: Mr Jodene Nam Head Wo Contrast  05/19/2013   CLINICAL DATA:  Non-small-cell lung cancer.  EXAM: MRI HEAD WITHOUT AND WITH CONTRAST  MRA HEAD WITHOUT CONTRAST  TECHNIQUE: Multiplanar, multiecho pulse sequences of the brain and surrounding structures were obtained without and with intravenous contrast. Angiographic images of the head were obtained using MRA technique without contrast.  CONTRAST:  58mL MULTIHANCE GADOBENATE DIMEGLUMINE 529 MG/ML IV SOLN  COMPARISON:  MR HEAD WO/W CM dated 03/15/2013  FINDINGS: MRI HEAD FINDINGS  The diffusion-weighted images demonstrate scattered areas of acute nonhemorrhagic infarction. There multiple punctate areas within the cerebellum bilaterally. A linear focus is present in the right occipital lobe. Additional areas are present in the frontal lobes bilaterally, more anteriorly on the right.  T2 changes are associated with the areas of acute/subacute infarction. These changes were not present previously. There is no evidence for hemorrhage.  Abnormal signal is present in the proximal left vertebral artery. Flow is otherwise present within the major intracranial arteries. The globes and orbits are intact. The paranasal sinuses and mastoid air cells are clear.  Postcontrast images demonstrate a 5 mm lesion in the medial anterior left frontal lobe compatible with a focal metastasis. Skull lesion is present within the squamous portion of the right temporal bone with a extension into the dura adjacent to the right temporal tip. The lesion measures 2.0 x 1.8 x 1.4 cm.  MRA HEAD FINDINGS  The study is mildly  degraded by patient motion. Internal carotid arteries demonstrate mild irregularity through the cavernous segment bilaterally without significant stenosis. There is moderate segmental irregularity in the hypoplastic left A1 segment. The right A1 segment is dominant. The anterior communicating artery is patent. There is segmental narrowing of ACA and MCA branch vessels bilaterally without a proximal stenosis or occlusion.  The left vertebral artery is occluded. The right vertebral artery is within normal limits. The PICA vessels are not seen. The basilar artery is within normal limits. Both posterior cerebral arteries originate from the basilar tip. There is some attenuation of PCA branch vessels bilaterally.  IMPRESSION: 1. Multiple bilateral nonhemorrhagic infarcts involving both the anterior and posterior circulation as  described. 2. Right mm medial left frontal lobe metastasis is new. 3. 2.0 x 1.8 x 1.4 cm lesion of the squamous portion of the right temporal bone with dural extension in the right middle cranial fossa. 4. 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 5. Moderate small vessel disease throughout.  Critical Value/emergent results were called by telephone at the time of interpretation on 05/19/2013 at 4:59 PM to Dr. Marzetta Board , who verbally acknowledged these results.   Electronically Signed   By: Lawrence Santiago M.D.   On: 05/19/2013 16:59   Mr [name] KZ Contrast  05/19/2013   CLINICAL DATA:  Non-small-cell lung cancer.  EXAM: MRI HEAD WITHOUT AND WITH CONTRAST  MRA HEAD WITHOUT CONTRAST  TECHNIQUE: Multiplanar, multiecho pulse sequences of the brain and surrounding structures were obtained without and with intravenous contrast. Angiographic images of the head were obtained using MRA technique without contrast.  CONTRAST:  28mL MULTIHANCE GADOBENATE DIMEGLUMINE 529 MG/ML IV SOLN  COMPARISON:  MR HEAD WO/W CM dated 03/15/2013  FINDINGS: MRI HEAD FINDINGS  The  diffusion-weighted images demonstrate scattered areas of acute nonhemorrhagic infarction. There multiple punctate areas within the cerebellum bilaterally. A linear focus is present in the right occipital lobe. Additional areas are present in the frontal lobes bilaterally, more anteriorly on the right.  T2 changes are associated with the areas of acute/subacute infarction. These changes were not present previously. There is no evidence for hemorrhage.  Abnormal signal is present in the proximal left vertebral artery. Flow is otherwise present within the major intracranial arteries. The globes and orbits are intact. The paranasal sinuses and mastoid air cells are clear.  Postcontrast images demonstrate a 5 mm lesion in the medial anterior left frontal lobe compatible with a focal metastasis. Skull lesion is present within the squamous portion of the right temporal bone with a extension into the dura adjacent to the right temporal tip. The lesion measures 2.0 x 1.8 x 1.4 cm.  MRA HEAD FINDINGS  The study is mildly degraded by patient motion. Internal carotid arteries demonstrate mild irregularity through the cavernous segment bilaterally without significant stenosis. There is moderate segmental irregularity in the hypoplastic left A1 segment. The right A1 segment is dominant. The anterior communicating artery is patent. There is segmental narrowing of ACA and MCA branch vessels bilaterally without a proximal stenosis or occlusion.  The left vertebral artery is occluded. The right vertebral artery is within normal limits. The PICA vessels are not seen. The basilar artery is within normal limits. Both posterior cerebral arteries originate from the basilar tip. There is some attenuation of PCA branch vessels bilaterally.  IMPRESSION: 1. Multiple bilateral nonhemorrhagic infarcts involving both the anterior and posterior circulation as described. 2. Right mm medial left frontal lobe metastasis is new. 3. 2.0 x 1.8 x 1.4 cm  lesion of the squamous portion of the right temporal bone with dural extension in the right middle cranial fossa. 4. 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 5. Moderate small vessel disease throughout.  Critical Value/emergent results were called by telephone at the time of interpretation on 05/19/2013 at 4:59 PM to Dr. Marzetta Board , who verbally acknowledged these results.   Electronically Signed   By: Lawrence Santiago M.D.   On: 05/19/2013 16:59    Scheduled Meds: . albuterol  2.5 mg Nebulization BID  . aspirin EC  81 mg Oral Daily  . azithromycin  500 mg Intravenous Q24H  .  budesonide-formoterol  2 puff Inhalation BID  . buPROPion  150 mg Oral Daily  . cefTRIAXone (ROCEPHIN)  IV  1 g Intravenous Q24H  . cholecalciferol  1,000 Units Oral Daily  . enoxaparin (LOVENOX) injection  1 mg/kg Subcutaneous Q12H  . feeding supplement (ENSURE COMPLETE)  237 mL Oral Q24H  . morphine  30 mg Oral Q12H  . nebivolol  10 mg Oral Daily  . predniSONE  20 mg Oral Q breakfast  . sodium chloride  3 mL Intravenous Q12H  . tiotropium  18 mcg Inhalation Daily   Continuous Infusions:    Principal Problem:   DVT (deep venous thrombosis) Active Problems:   DEPRESSION   HYPERTENSION   Non-small cell cancer of right lung   Elevated troponin   Hip fracture, left   Protein-calorie malnutrition, severe   Cerebral embolism with cerebral infarction  Time spent: 25 minutes   Marzetta Board MD [facility] Pager [phone]. If 7PM-7AM, please contact night-coverage at [email or web address], password [facility] 05/21/2013, 10:47 AM  LOS: 5 days

## 2013-05-21 NOTE — Progress Notes (Signed)
Physical Therapy Treatment Patient Details Name: Meagan Garcia MRN: [id number] DOB: [birth year] Today's Date: 05/21/2013 Time: 5597-4163 PT Time Calculation (min): 40 min  PT Assessment / Plan / Recommendation  History of Present Illness 63 y.o. female with h/o lung cancer admitted withweakness, and fall. Pt sustained non-surgical L hip fx and is WBAT per chart.   PT Comments   Pt on 2 lts in bed at 96%.  Assisted pt OOB to amb in hallway very limited distance.  Very slow sluggish gait.  Spouse assisted by following with chair.  Pt will need ST Rehab at SNF.  Follow Up Recommendations  SNF     Does the patient have the potential to tolerate intense rehabilitation     Barriers to Discharge        Equipment Recommendations       Recommendations for Other Services    Frequency Min 3X/week   Progress towards PT Goals Progress towards PT goals: Progressing toward goals  Plan      Precautions / Restrictions Precautions Precautions: Fall Restrictions Weight Bearing Restrictions: No Other Position/Activity Restrictions: WBAT LLE   Pertinent Vitals/Pain     Mobility  Bed Mobility Overal bed mobility: Needs Assistance Bed Mobility: Supine to Sit Supine to sit: Mod assist General bed mobility comments: increased time, slow and sluggish Transfers Overall transfer level: Needs assistance Equipment used: Rolling walker (2 wheeled) Transfers: Sit to/from Stand Sit to Stand: Mod assist General transfer comment: Assist to rise, stabilize, control descent. VCs safety, technique, hand placement plus increased time.  Spouse states pt can not get up to fast "she has to take her time". Ambulation/Gait Ambulation/Gait assistance: +2 safety/equipment Ambulation Distance (Feet): 60 Feet (20', 20', 20 with sitting break between) Assistive device: Rolling walker (2 wheeled) Gait Pattern/deviations: Step-to pattern;Step-through pattern;Decreased stance time - left;Decreased step length -  right;Decreased step length - left;Antalgic Gait velocity: decreased General Gait Details: very slow sluggish gait and long sitting rest breaks between.  Spouse assisted by following with chair. Amb on 2 lts first sats 98%.  Amb on RA sats 96%.  VC's for deep purse lip breathing to increase O2 exchange.     PT Goals (current goals can now be found in the care plan section)    Visit Information  Last PT Received On: 05/21/13 Assistance Needed: +2 History of Present Illness: 63 y.o. female with h/o lung cancer admitted withweakness, and fall. Pt sustained non-surgical L hip fx and is WBAT per chart.    Subjective Data      Cognition       Balance     End of Session PT - End of Session Equipment Utilized During Treatment: Gait belt;Oxygen Activity Tolerance: Patient limited by fatigue Patient left: in chair;with call bell/phone within reach;with family/visitor present Nurse Communication: Mobility status   [name]  PTA [facility]  Acute  Rehab Pager      [phone]

## 2013-05-22 MED ORDER — MORPHINE SULFATE ER 15 MG PO TBCR
15.0000 mg | EXTENDED_RELEASE_TABLET | Freq: Two times a day (BID) | ORAL | Status: DC
  Administered 2013-05-22 – 2013-05-23 (×3): 15 mg via ORAL
  Filled 2013-05-22 (×4): qty 1

## 2013-05-22 NOTE — Progress Notes (Signed)
Patient got up to bathroom using a walker without oxygen. When patient came back to bed, her oxy sat. Was 88%. 2L [place] was put on patient and oxygen went up to 94%

## 2013-05-22 NOTE — Progress Notes (Signed)
Please note that pt's family is concerned about pt's decreased appetite. Wants to have medication to increase, if possible.

## 2013-05-22 NOTE — Progress Notes (Signed)
[facility] PROGRESS NOTE  Meagan Garcia YKD:[id number] DOB: 1951-02-24 DOA: 05/16/2013 PCP: [name], MD  Assessment/Plan: Pt is 63 y.o. female, with known history of lung cancer, followed by Dr. Earlie Server, status post chemotherapy and radiation therapy, who presented to [facility] ED with main concern of progressively worsening generalized weakness after an episode of fall she sustained at home, persistent and throbbing left hip pain post fall. She also explains she has had PICC line placed in January 2015, has history of lower extremity DVT and is on Lovenox but was not able to take it for three days as it was not available in pharmacy. In ED, US of the right upper extremity was significant for acute DVT and left hip xray was significant for non displaced fracture. TRh asked to admit for further evaluation.   Multiple bilateral CVAs - neurology consulted. Appreciate input. Recommending TEE, I have talked to cardiology to schedule a TEE for Monday. 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 No evidence of bleeding. Left hip fracture  - I discussed with Dr. Marlou Sa from orthopedic surgery on 3/12 and after reviewing CT scan of the hip it seems like her hip fracture is non operative and has good outcomes with conservative treatment, weight bearing as tolerated. - continue analgesia as needed for pain control  - will need SNF at discharge, patient agreeable. - Patient seems more sleepy in the last couple of days, decrease MS Contin to 15 mg twice daily Acute respiratory failure  - unclear etiology, pt has some wheezing and rhonchi on exam  - placed on empiric ABX for now, BD's scheduled and as needed, solumedrol/taper  - follow up on sputum analysis  Leukocytosis  - possibly from DVT  and ? PNA - ABS as noted above and repeat CBC in AM  Elevated troponin  - pt denies chest pain and no events on telemetry  - troponins trending down, no need for further trending in the absence of chest pain,  - Elevated troponin likely in setting of new CVAs Anemia of chronic disease  - Hg and Hct stable and at baseline  - repeat CBC in AM  Thrombocytopenia  - no signs of active bleeding  -resolved  Consultants:  Orthopedic surgery (over the phone) Oncology Neurology Cardiology (for TEE)  Antibiotics:  Zithromax 3/10 >> Ceftriaxone 3/10 >>  Code Status: full Family Communication: d/w patient and family in the room   Disposition Plan: pend clinical improvement, SNF once CVA workup is complete including the TEE  Procedures:  2D echo  Carotid doppler  HPI/Subjective: - has no complaints this morning, sleeping but wakes up easily  Objective: Filed Vitals:   05/22/13 0658  BP: 110/65  Pulse: 94  Temp: 97.5 F (36.4 C)  Resp: 14    Intake/Output Summary (Last 24 hours) at 05/22/13 1226 Last data filed at 05/22/13 0600  Gross per 24 hour  Intake    530 ml  Output      0 ml  Net    530 ml   Filed Weights   05/16/13 1748 05/16/13 2231  Weight: 93.441 kg (206 lb) 89.2 kg (196 lb 10.4 oz)   Exam:  General:  alert  Cardiovascular: s1,s2 rrr  Respiratory: CAT BL  Abdomen: soft, nt,nd  Musculoskeletal: no LE edema  Data Reviewed: Basic Metabolic Panel:  Recent Labs Lab 05/16/13 1710 05/17/13 0345 05/18/13 0335 05/19/13 0335 05/20/13 0350 05/21/13 0518  NA 133* 133* 137  --   --  140  K 3.9 3.8 4.0  --   --  4.4  CL 94* 96 101  --   --  103  CO2 23 22 21   --   --  25  GLUCOSE 103* 94 126*  --   --  88  BUN 13 12 11   --   --  15  CREATININE 0.92 0.97 0.81  --  0.85 0.80  CALCIUM 9.9 9.5 9.5  --   --  10.3  MG  --   --   --  2.1  --   --    Liver Function Tests:  Recent Labs Lab 05/16/13 1710  AST 19  ALT 12  ALKPHOS 117  BILITOT 0.4   PROT 6.6  ALBUMIN 2.1*   CBC:  Recent Labs Lab 05/16/13 1710 05/17/13 0345 05/18/13 0335 05/18/13 1458 05/21/13 0518  WBC 14.2* 11.6* 9.8 14.5* 12.5*  HGB 10.2* 9.6* 9.2* 9.8* 9.6*  HCT 31.4* 29.2* 28.9* 29.9* 30.6*  MCV 92.9 93.3 95.1 93.7 97.1  PLT 119* 115* 139* 197 229   Cardiac Enzymes:  Recent Labs Lab 05/16/13 2330 05/17/13 0345 05/17/13 1035  TROPONINI 1.15* 0.84* 0.71*   BNP (last 3 results)  Recent Labs  03/07/13 1210  PROBNP 933.4*   Studies: No results found.  Scheduled Meds: . aspirin EC  81 mg Oral Daily  . azithromycin  500 mg Intravenous Q24H  . budesonide-formoterol  2 puff Inhalation BID  . buPROPion  150 mg Oral Daily  . cefTRIAXone (ROCEPHIN)  IV  1 g Intravenous Q24H  . cholecalciferol  1,000 Units Oral Daily  . enoxaparin (LOVENOX) injection  1 mg/kg Subcutaneous Q12H  . feeding supplement (ENSURE COMPLETE)  237 mL Oral Q24H  . morphine  15 mg Oral Q12H  . nebivolol  10 mg Oral Daily  . predniSONE  20 mg Oral Q breakfast  . sodium chloride  3 mL Intravenous Q12H  . tiotropium  18 mcg Inhalation Daily   Continuous Infusions:    Principal Problem:   DVT (deep venous thrombosis) Active Problems:   DEPRESSION   HYPERTENSION   Non-small cell cancer of right lung   Elevated troponin   Hip fracture, left   Protein-calorie malnutrition, severe   Cerebral embolism with cerebral infarction  Time spent: 25 minutes   Marzetta Board MD [facility] Pager [phone]. If 7PM-7AM, please contact night-coverage at [email or web address], password [facility] 05/22/2013, 12:26 PM  LOS: 6 days

## 2013-05-23 ENCOUNTER — Encounter (HOSPITAL_COMMUNITY): Payer: Self-pay | Admitting: *Deleted

## 2013-05-23 ENCOUNTER — Ambulatory Visit (HOSPITAL_COMMUNITY): Admission: RE | Admit: 2013-05-23 | Payer: BC Managed Care – PPO | Source: Ambulatory Visit

## 2013-05-23 ENCOUNTER — Telehealth: Payer: Self-pay | Admitting: Internal Medicine

## 2013-05-23 ENCOUNTER — Encounter (HOSPITAL_COMMUNITY)
Admission: EM | Disposition: A | Discharge status: Skilled Nursing Facility | Payer: Self-pay | Source: Home / Self Care | Attending: Internal Medicine

## 2013-05-23 DIAGNOSIS — I6340 Cerebral infarction due to embolism of unspecified cerebral artery: Secondary | ICD-10-CM

## 2013-05-23 HISTORY — PX: TEE WITHOUT CARDIOVERSION: SHX5443

## 2013-05-23 LAB — BASIC METABOLIC PANEL
BUN: 16 mg/dL (ref 6–23)
CHLORIDE: 96 meq/L (ref 96–112)
CO2: 22 mEq/L (ref 19–32)
CREATININE: 0.82 mg/dL (ref 0.50–1.10)
Calcium: 10.7 mg/dL — ABNORMAL HIGH (ref 8.4–10.5)
GFR calc Af Amer: 88 mL/min — ABNORMAL LOW (ref >90)
GFR calc non Af Amer: 76 mL/min — ABNORMAL LOW (ref >90)
GLUCOSE: 79 mg/dL (ref 70–99)
Potassium: 5.1 mEq/L (ref 3.7–5.3)
Sodium: 134 mEq/L — ABNORMAL LOW (ref 137–147)

## 2013-05-23 LAB — CBC
HEMATOCRIT: 30.6 % — AB (ref 36.0–46.0)
HEMOGLOBIN: 8.6 g/dL — AB (ref 12.0–15.0)
MCH: 27.1 pg (ref 26.0–34.0)
MCHC: 28.1 g/dL — ABNORMAL LOW (ref 30.0–36.0)
MCV: 96.5 fL (ref 78.0–100.0)
Platelets: 224 10*3/uL (ref 150–400)
RBC: 3.17 MIL/uL — ABNORMAL LOW (ref 3.87–5.11)
RDW: 20 % — ABNORMAL HIGH (ref 11.5–15.5)
WBC: 15.8 10*3/uL — ABNORMAL HIGH (ref 4.0–10.5)

## 2013-05-23 SURGERY — ECHOCARDIOGRAM, TRANSESOPHAGEAL
Anesthesia: Moderate Sedation

## 2013-05-23 MED ORDER — FENTANYL CITRATE 0.05 MG/ML IJ SOLN
INTRAMUSCULAR | Status: AC
  Filled 2013-05-23: qty 2

## 2013-05-23 MED ORDER — MIDAZOLAM HCL 10 MG/2ML IJ SOLN
INTRAMUSCULAR | Status: DC | PRN
  Administered 2013-05-23: 1 mg via INTRAVENOUS

## 2013-05-23 MED ORDER — FENTANYL CITRATE 0.05 MG/ML IJ SOLN
INTRAMUSCULAR | Status: DC | PRN
  Administered 2013-05-23: 25 ug via INTRAVENOUS

## 2013-05-23 MED ORDER — MIDAZOLAM HCL 5 MG/ML IJ SOLN
INTRAMUSCULAR | Status: AC
  Filled 2013-05-23: qty 2

## 2013-05-23 MED ORDER — LIDOCAINE VISCOUS 2 % MT SOLN
OROMUCOSAL | Status: DC | PRN
  Administered 2013-05-23: 2 via OROMUCOSAL

## 2013-05-23 MED ORDER — SODIUM CHLORIDE 0.9 % IV SOLN
INTRAVENOUS | Status: DC
  Administered 2013-05-23: 500 mL via INTRAVENOUS

## 2013-05-23 NOTE — H&P (Signed)
     INTERVAL PROCEDURE H&P  History and Physical Interval Note:  05/23/2013 11:52 AM  Meagan Garcia has presented today for their planned procedure. The various methods of treatment have been discussed with the patient and family. After consideration of risks, benefits and other options for treatment, the patient has consented to the procedure.  The patients' outpatient history has been reviewed, patient examined, and no change in status from most recent office note within the past 30 days. I have reviewed the patients' chart and labs and will proceed as planned. Questions were answered to the patient's satisfaction.   Pixie Casino, MD, [facility] Attending Cardiologist [facility]  HILTY,Kenneth C 05/23/2013, 11:52 AM

## 2013-05-23 NOTE — Progress Notes (Signed)
PT Cancellation Note  ___Treatment cancelled today due to medical issues with patient which prohibited therapy  _X_ Treatment cancelled today due to patient receiving procedure or test .........[facility] for TEE a good portion of day.  Will see first thing in am.  ___ Treatment cancelled today due to patient's refusal to participate   ___ Treatment cancelled today due to   [name]  PTA [facility]  Acute  Rehab Pager      [phone]

## 2013-05-23 NOTE — Progress Notes (Signed)
Patient has a bed at [facility] SNF when ready. [employer] authorization has been obtained. Anticipating possible discharge tomorrow.   CSW will check back in the morning.   Raynaldo Opitz, [facility] Clinical Social Worker cell #: [phone]

## 2013-05-23 NOTE — Telephone Encounter (Signed)
lmonvm for pt re appt for 3/25 and also confirmed appts for 3/20 and 3/23. appts for 3/16 and 3/18 cx'd due to pt in hosp. and per 3/13 pof pt to f/u in 2wks. per desk nurse ct on schedule for today cx'd and MM has ordered pet to be scheduled when pt is d/c'd. s/w toni in central she is aware. scheduled mailed.

## 2013-05-23 NOTE — Progress Notes (Signed)
  Echocardiogram Echocardiogram Transesophageal has been performed.  Philipp Deputy 05/23/2013, 3:35 PM

## 2013-05-23 NOTE — Progress Notes (Signed)
CHART NOTE Meagan Garcia is seen today for a social visit. Her husband was at the bedside. She is feeling much better today with no specific complaints except for fatigue. She was expected to be discharged to a skilled nursing facility but she has further evaluation for her recent stroke. She is expected to have TEE performed today. I arranged for the patient to have a PET scan performed once she is discharged from the hospital.

## 2013-05-23 NOTE — CV Procedure (Signed)
    TRANSESOPHAGEAL ECHOCARDIOGRAM (TEE) NOTE  INDICATIONS: cryptogenic stroke  PROCEDURE:   Informed consent was obtained prior to the procedure. The risks, benefits and alternatives for the procedure were discussed and the patient comprehended these risks.  Risks include, but are not limited to, cough, sore throat, vomiting, nausea, somnolence, esophageal and stomach trauma or perforation, bleeding, low blood pressure, aspiration, pneumonia, infection, trauma to the teeth and death.    After a procedural time-out, the patient was given 3 mg versed and 25 mcg fentanyl for moderate sedation.  The oropharynx was anesthetized 10 cc of topical 1% viscous lidocaine and 1 cetacaine spray.  The transesophageal probe was inserted in the esophagus and stomach without difficulty and multiple views were obtained.  The patient was kept under observation until the patient left the procedure room.  The patient left the procedure room in stable condition.   Agitated microbubble saline contrast was administered.  COMPLICATIONS:    There were no immediate complications.  Findings:  1. LEFT VENTRICLE: The left ventricular wall thickness is mildly increased.  The left ventricular cavity is normal in size. Wall motion is normal.  LVEF is 55-60%.  2. RIGHT VENTRICLE:  The right ventricle is normal in structure and function without any thrombus or masses.    3. LEFT ATRIUM:  The left atrium is mildly dilated in size without any thrombus or masses.  There is not spontaneous echo contrast ("smoke") in the left atrium consistent with a low flow state.  4. LEFT ATRIAL APPENDAGE:  The left atrial appendage is free of any thrombus or masses. The appendage has single lobes. Pulse doppler indicates high flow in the appendage.  5. ATRIAL SEPTUM:  The atrial septum appears intact and is free of thrombus and/or masses.  There is no evidence for interatrial shunting by color doppler and saline microbubble.  6. RIGHT  ATRIUM:  The right atrium is normal in size and function without any thrombus or masses.  7. MITRAL VALVE:  The mitral valve is normal in structure and function with trivial regurgitation.  There was a redundant, thick subvalvular cord to the posterior leaflet.  8. AORTIC VALVE:  The aortic valve is normal in structure and function with no regurgitation.  There were no vegetations or stenosis  9. TRICUSPID VALVE:  The tricuspid valve is normal in structure and function with Mild regurgitation.  There were no vegetations or stenosis  10.  PULMONIC VALVE:  The pulmonic valve is normal in structure and function with no regurgitation.  There were no vegetations or stenosis.   11. AORTIC ARCH, ASCENDING AND DESCENDING AORTA:  The aorta was not well visualized.  IMPRESSION:   1. No LAA thrombus. 2. No evidence for PFO. 3.   Normal LV systolic function  RECOMMENDATIONS:    1.  No intracardiac source of embolus identified to explain her stroke.  Time Spent Directly with the Patient:  45 minutes   Pixie Casino, MD, [facility] Attending Cardiologist [facility]  05/23/2013, 3:04 PM

## 2013-05-23 NOTE — Progress Notes (Signed)
[facility] PROGRESS NOTE  SHIRONDA KAIN GGY:[id number] DOB: 1950/12/02 DOA: 05/16/2013 PCP: [name], MD  Assessment/Plan: Pt is 63 y.o. female, with known history of lung cancer, followed by Dr. Earlie Server, status post chemotherapy and radiation therapy, who presented to [facility] ED with main concern of progressively worsening generalized weakness after an episode of fall she sustained at home, persistent and throbbing left hip pain post fall. She also explains she has had PICC line placed in January 2015, has history of lower extremity DVT and is on Lovenox but was not able to take it for three days as it was not available in pharmacy. In ED, US of the right upper extremity was significant for acute DVT and left hip xray was significant for non displaced fracture. [facility] asked to admit for further evaluation.   Multiple bilateral CVAs - neurology consulted. Appreciate input. Recommending TEE, and is to be done today. - Appreciate neurology consultation. 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 No evidence of bleeding. Left hip fracture  - I discussed with Dr. Marlou Sa from orthopedic surgery on 3/12 and after reviewing CT scan of the hip it seems like her hip fracture is non operative and has good outcomes with conservative treatment, weight bearing as tolerated. - continue analgesia as needed for pain control  - will need SNF at discharge, patient agreeable. - Patient seems more sleepy over the weekend, decreased MS Contin to 15 mg twice daily and she is significantly more alert today. Continue current regimen. Acute respiratory failure  - unclear etiology, pt has some wheezing and rhonchi on exam  - placed on empiric ABX for now, BD's scheduled and as needed, solumedrol/taper  - follow up on sputum analysis, no results yet Leukocytosis  - possibly from DVT and ? PNA - ABS as  noted above and repeat CBC in AM  Elevated troponin  - pt denies chest pain and no events on telemetry  - troponins trending down, no need for further trending in the absence of chest pain,  - Elevated troponin likely in setting of new CVAs Anemia of chronic disease  - Hg and Hct stable and at baseline  - repeat CBC in AM  Thrombocytopenia  - no signs of active bleeding  -resolved  Consultants:  Orthopedic surgery (over the phone) Oncology Neurology Cardiology (for TEE)  Antibiotics:  Zithromax 3/10 >> Ceftriaxone 3/10 >>  Code Status: full Family Communication: d/w patient and family in the room   Disposition Plan: pend clinical improvement, SNF once CVA workup is complete including the TEE  Procedures:  2D echo  Carotid doppler  HPI/Subjective: - Much more alert this morning, without complaints.  Objective: Filed Vitals:   05/23/13 0400  BP: 111/49  Pulse: 104  Temp: 97.7 F (36.5 C)  Resp: 18    Intake/Output Summary (Last 24 hours) at 05/23/13 0931 Last data filed at 05/23/13 0115  Gross per 24 hour  Intake    650 ml  Output    400 ml  Net    250 ml   Filed Weights   05/16/13 1748 05/16/13 2231  Weight: 93.441 kg (206 lb) 89.2 kg (196 lb 10.4 oz)   Exam:  General:  alert  Cardiovascular: s1,s2 rrr  Respiratory: CAT BL  Abdomen: soft, nt,nd  Musculoskeletal: no LE edema    Data Reviewed: Basic Metabolic Panel:  Recent Labs Lab 05/16/13 1710 05/17/13  0345 05/18/13 0335 05/19/13 0335 05/20/13 0350 05/21/13 0518 05/23/13 0347  NA 133* 133* 137  --   --  140 134*  K 3.9 3.8 4.0  --   --  4.4 5.1  CL 94* 96 101  --   --  103 96  CO2 23 22 21   --   --  25 22  GLUCOSE 103* 94 126*  --   --  88 79  BUN 13 12 11   --   --  15 16  CREATININE 0.92 0.97 0.81  --  0.85 0.80 0.82  CALCIUM 9.9 9.5 9.5  --   --  10.3 10.7*  MG  --   --   --  2.1  --   --   --    Liver Function Tests:  Recent Labs Lab 05/16/13 1710  AST 19  ALT 12   ALKPHOS 117  BILITOT 0.4  PROT 6.6  ALBUMIN 2.1*   CBC:  Recent Labs Lab 05/17/13 0345 05/18/13 0335 05/18/13 1458 05/21/13 0518 05/23/13 0347  WBC 11.6* 9.8 14.5* 12.5* 15.8*  HGB 9.6* 9.2* 9.8* 9.6* 8.6*  HCT 29.2* 28.9* 29.9* 30.6* 30.6*  MCV 93.3 95.1 93.7 97.1 96.5  PLT 115* 139* 197 229 224   Cardiac Enzymes:  Recent Labs Lab 05/16/13 2330 05/17/13 0345 05/17/13 1035  TROPONINI 1.15* 0.84* 0.71*   BNP (last 3 results)  Recent Labs  03/07/13 1210  PROBNP 933.4*   Studies: No results found.  Scheduled Meds: . aspirin EC  81 mg Oral Daily  . azithromycin  500 mg Intravenous Q24H  . budesonide-formoterol  2 puff Inhalation BID  . buPROPion  150 mg Oral Daily  . cefTRIAXone (ROCEPHIN)  IV  1 g Intravenous Q24H  . cholecalciferol  1,000 Units Oral Daily  . enoxaparin (LOVENOX) injection  1 mg/kg Subcutaneous Q12H  . feeding supplement (ENSURE COMPLETE)  237 mL Oral Q24H  . morphine  15 mg Oral Q12H  . nebivolol  10 mg Oral Daily  . predniSONE  20 mg Oral Q breakfast  . sodium chloride  3 mL Intravenous Q12H  . tiotropium  18 mcg Inhalation Daily   Continuous Infusions:    Principal Problem:   DVT (deep venous thrombosis) Active Problems:   DEPRESSION   HYPERTENSION   Non-small cell cancer of right lung   Elevated troponin   Hip fracture, left   Protein-calorie malnutrition, severe   Cerebral embolism with cerebral infarction  Time spent: 25 minutes   Marzetta Board MD [facility] Pager [phone]. If 7PM-7AM, please contact night-coverage at [email or web address], password [facility] 05/23/2013, 9:31 AM  LOS: 7 days

## 2013-05-24 ENCOUNTER — Encounter (HOSPITAL_COMMUNITY): Payer: Self-pay | Admitting: Internal Medicine

## 2013-05-24 ENCOUNTER — Ambulatory Visit: Payer: BC Managed Care – PPO | Admitting: Physical Therapy

## 2013-05-24 DIAGNOSIS — I10 Essential (primary) hypertension: Secondary | ICD-10-CM

## 2013-05-24 DIAGNOSIS — I82409 Acute embolism and thrombosis of unspecified deep veins of unspecified lower extremity: Secondary | ICD-10-CM

## 2013-05-24 DIAGNOSIS — C3490 Malignant neoplasm of unspecified part of unspecified bronchus or lung: Secondary | ICD-10-CM

## 2013-05-24 DIAGNOSIS — I6340 Cerebral infarction due to embolism of unspecified cerebral artery: Secondary | ICD-10-CM

## 2013-05-24 LAB — BASIC METABOLIC PANEL
BUN: 14 mg/dL (ref 6–23)
CHLORIDE: 95 meq/L — AB (ref 96–112)
CO2: 24 meq/L (ref 19–32)
Calcium: 11.1 mg/dL — ABNORMAL HIGH (ref 8.4–10.5)
Creatinine, Ser: 0.86 mg/dL (ref 0.50–1.10)
GFR calc non Af Amer: 71 mL/min — ABNORMAL LOW (ref >90)
GFR, EST AFRICAN AMERICAN: 83 mL/min — AB (ref >90)
Glucose, Bld: 70 mg/dL (ref 70–99)
POTASSIUM: 4.3 meq/L (ref 3.7–5.3)
SODIUM: 134 meq/L — AB (ref 137–147)

## 2013-05-24 LAB — CBC
HCT: 33.4 % — ABNORMAL LOW (ref 36.0–46.0)
Hemoglobin: 10.3 g/dL — ABNORMAL LOW (ref 12.0–15.0)
MCH: 30.3 pg (ref 26.0–34.0)
MCHC: 30.8 g/dL (ref 30.0–36.0)
MCV: 98.2 fL (ref 78.0–100.0)
PLATELETS: 225 10*3/uL (ref 150–400)
RBC: 3.4 MIL/uL — AB (ref 3.87–5.11)
RDW: 20.1 % — AB (ref 11.5–15.5)
WBC: 13 10*3/uL — AB (ref 4.0–10.5)

## 2013-05-24 MED ORDER — OXYCODONE HCL 5 MG PO TABS: 5.0000 mg | ORAL_TABLET | Freq: Four times a day (QID) | ORAL | Status: AC | PRN

## 2013-05-24 MED ORDER — ENSURE COMPLETE PO LIQD: 237.0000 mL | ORAL | Status: DC

## 2013-05-24 MED ORDER — ENOXAPARIN SODIUM 150 MG/ML ~~LOC~~ SOLN: 140.0000 mg | Freq: Every day | SUBCUTANEOUS | Status: DC

## 2013-05-24 MED ORDER — MORPHINE SULFATE ER 15 MG PO TBCR: 15.0000 mg | EXTENDED_RELEASE_TABLET | Freq: Two times a day (BID) | ORAL | Status: AC

## 2013-05-24 MED ORDER — PREDNISONE 10 MG PO TABS
10.0000 mg | ORAL_TABLET | Freq: Every day | ORAL | Status: AC
Start: 2013-05-25 — End: ?

## 2013-05-24 NOTE — Discharge Summary (Signed)
Physician Discharge Summary  Meagan Garcia IWP:[id number] DOB: 01-Nov-1950 DOA: 05/16/2013  PCP: [name], MD  Admit date: 05/16/2013 Discharge date: 05/24/2013  Time spent: Greater than 30 minutes  Recommendations for Outpatient Follow-up:  1. M.D. at skilled nursing facility in 3 days with repeat labs (CBC & BMP) 2. Dr. [name], PCP on discharge from SNF. 3. Dr. Curt Bears, oncologist. He is arranging outpatient PET scan. 4. Dr. Alphonzo Severance, orthopedics in 2 weeks 5. Recommend followup chest x-ray in 2 weeks to reassess right pleural effusion.  Discharge Diagnoses:  Principal Problem:   DVT (deep venous thrombosis) Active Problems:   DEPRESSION   HYPERTENSION   Non-small cell cancer of right lung   Elevated troponin   Hip fracture, left   Protein-calorie malnutrition, severe   Cerebral embolism with cerebral infarction   Discharge Condition: Improved & Stable  Diet recommendation: Heart healthy diet  Filed Weights   05/16/13 1748 05/16/13 2231  Weight: 93.441 kg (206 lb) 89.2 kg (196 lb 10.4 oz)    History of present illness:  63 year old female patient with history of lung cancer, status post chemotherapy therapy and radiation, followed by Dr. Curt Bears, on full dose Lovenox for lower extremity DVT, was admitted on 05/16/13 with complaints of generalized weakness, fall, left hip pain, right upper extremity pain and swelling. She had missed her Lovenox for 3 days PTA. In the ED, right upper extremity Doppler showed evidence of acute DVT and x-rays revealed nondisplaced left hip fracture. EDP discussed with orthopedics on call who advised that this was not a surgical fracture and recommended weightbearing as tolerated. She was admitted for further management.  Hospital Course:   1. Right upper extremity DVT/recurrent: Likely precipitated by missing her Lovenox shots for couple days prior to admission & PICC line-removed this admission. Resumed on  weight-based full dose Lovenox and advised regarding compliance. 2. Left hip fracture: Patient's case was discussed by Dr. Cruzita Lederer with Dr. Alphonzo Severance on 05/19/13 who recommended nonsurgical management and weight bearing as tolerated. Pain is adequately controlled. 3. Acute respiratory failure: Possibly from COPD exacerbation. Patient has completed almost 8 days of IV Rocephin and azithromycin. Resolved. Continue home bronchodilator regimen. Not on home oxygen or chronic steroids. We'll taper prednisone to DC in the next 4 days. 4. CVA/multiple small bilateral acute infarcts: This was found incidentally while MRI brain which was done to evaluate for metastatic disease. Neurology consulted and patient has completed extensive stroke workup. Final recommendations by neurology are to continue full dose Lovenox & no aspirin at this time. Although felt to be embolic, no source identified. 5. Leukocytosis: Possibly from acute illness. Improved. Follow CBC outpatient. 6. History of depression: Continue prior home medications. 7. Hypertension: Controlled. 8. Anemia of chronic disease: Stable. 9. Thrombocytopenia: Resolved 10. Metastatic non-small cell lung cancer: Oncology aware of patient's hospitalization and have arranged for outpatient PET scan. 11. Elevated troponin: No history of chest pain. No arrhythmias on telemetry. Troponins improved from a peak of 1.15 to 0.71. Echo (TTE & TEE)- with normal EF and no wall motion abnormalities. Probably demand ischemia from fall, acute strokes and acute respiratory failure. No aspirin continued secondary to increased bleeding risk while on Lovenox and brain metastasis. May consider outpatient cardiology consultation for stress test if deemed necessary. Patient had elevated troponins in December as well-? Chronic elevation   Consultations:  Medical oncology  [place] phone  Neurology   Cardiology-for TEE   Procedures:  None    Discharge  Exam:  Complaints:  Minimal nonproductive cough. Denies dyspnea. No headache. Left hip pain is controlled. Anxious to be discharged.  Filed Vitals:   05/23/13 1914 05/23/13 2040 05/24/13 0523 05/24/13 1126  BP:  107/62 135/67   Pulse:  95 98   Temp:  98 F (36.7 C) 98.3 F (36.8 C)   TempSrc:  Oral Oral   Resp:  18 20   Height:      Weight:      SpO2: 93% 93% 92% 92%    General exam: Pleasant comfortable female lying in bed.  Respiratory system: Clear. No increased work of breathing. Cardiovascular system: S1 & S2 heard, RRR. No JVD, murmurs, gallops, clicks or pedal edema.Telemetry: Sinus rhythm in the 90s-sinus tachycardia in the 100s.  Gastrointestinal system: Abdomen is nondistended, soft and nontender. Normal bowel sounds heard. Central nervous system: Alert and oriented. No focal neurological deficits. Extremities: Symmetric 5 x 5 power.  Discharge Instructions      Discharge Orders   Future Appointments Provider Department Dept Phone   05/26/2013 1:45 PM [place] [phone]   05/27/2013 10:45 AM [facility] [phone]   05/14/2013 11:30 AM [facility] [phone]   05/31/2013 1:45 PM [place] [phone]   06/01/2013 11:30 AM [facility] [phone]   06/01/2013 11:45 AM [facility] [phone]   06/01/2013 12:15 PM Curt Bears, [facility] [phone]   06/02/2013 10:00 AM Blair Promise, MD [facility] [phone]   06/02/2013 1:45 PM Suanne Marker, PTA OUTPATIENT CANCER [facility] [address] [phone]   06/29/13 10:30 AM [facility] Nurse  [facility] [phone]   06/06/2013 11:00 AM [facility] Nurse [facility] [phone]   06/06/2013 2:45 PM Elsie Stain, MD [facility] [phone]   06/07/2013 1:45 PM [place] [phone]   06/09/2013 1:45 PM Norwood Levo, PT OUTPATIENT [facility]ER [facility] [phone]   Future Orders Complete By Expires   Call MD for:  difficulty breathing, headache or visual disturbances  As directed    Call MD for:  severe uncontrolled pain  As directed    Call MD for:  temperature >100.4  As directed    Diet - low sodium heart healthy  As directed    Discharge instructions  As directed    Comments:     Weightbearing as tolerated on left lower extremity.   Increase activity slowly  As directed        Medication List    STOP taking these medications       HYDROcodone-homatropine 5-1.5 MG/5ML syrup  Commonly known as:  HYCODAN     LORazepam 1 MG tablet  Commonly known as:  ATIVAN      TAKE these medications       albuterol 108 (90 BASE) MCG/ACT inhaler  Commonly known as:  PROVENTIL HFA;VENTOLIN HFA  Inhale 2 puffs into the lungs every 6 (six) hours as needed for wheezing or shortness of breath.     budesonide-formoterol 160-4.5 MCG/ACT inhaler  Commonly known as:  SYMBICORT  Inhale 2 puffs into the lungs 2 (two) times daily.     buPROPion 150 MG 12 hr  tablet  Commonly known as:  ZYBAN  Take 150 mg by mouth daily.     cholecalciferol 1000 UNITS tablet  Commonly known as:  VITAMIN D  Take 1 tablet (1,000 Units total) by mouth daily.     docusate sodium 100 MG capsule  Commonly known as:  COLACE  Take 200 mg by mouth daily.     enoxaparin 150 MG/ML injection  Commonly known as:  LOVENOX  Inject 0.93 mLs (140 mg total) into the skin daily at 6 PM. Starting 05/24/2013     feeding supplement (ENSURE COMPLETE) Liqd   Take 237 mLs by mouth daily.     fluticasone 50 MCG/ACT nasal spray  Commonly known as:  FLONASE  Place 2 sprays into the nose daily.     hyaluronate sodium Gel  Apply 1 application topically 2 (two) times daily. Apply to chest area being treated with radiation after rad tx and bedtime when skin becomes itchy or irritation/erythema  And on weekedns as well     morphine 15 MG 12 hr tablet  Commonly known as:  MS CONTIN  Take 1 tablet (15 mg total) by mouth every 12 (twelve) hours.     nebivolol 10 MG tablet  Commonly known as:  BYSTOLIC  Take 1 tablet (10 mg total) by mouth daily.     oxyCODONE 5 MG immediate release tablet  Commonly known as:  Oxy IR/ROXICODONE  Take 1 tablet (5 mg total) by mouth every 6 (six) hours as needed for breakthrough pain.     predniSONE 10 MG tablet  Commonly known as:  DELTASONE  Take 1 tablet (10 mg total) by mouth daily with breakfast. Stop after 05/28/2013 dose.  Start taking on:  05/25/2013     prochlorperazine 10 MG tablet  Commonly known as:  COMPAZINE  Take 1 tablet (10 mg total) by mouth every 6 (six) hours as needed for nausea or vomiting.     tiotropium 18 MCG inhalation capsule  Commonly known as:  SPIRIVA HANDIHALER  Place 1 capsule (18 mcg total) into inhaler and inhale daily.       Follow-up Information   Schedule an appointment as soon as possible for a visit with [name], MD.   Specialty:  Internal Medicine   Contact information:   [address]  [place] [address] [phone]       Follow up with MD at SNF. Schedule an appointment as soon as possible for a visit in 3 days. (To be seen with repeat labs (CBC & BMP).)       Schedule an appointment as soon as possible for a visit with Eilleen Kempf., MD.   Specialty:  Oncology   Contact information:   [place] [address] [phone]       Follow up with Meredith Pel, MD. Schedule an appointment as soon as  possible for a visit in 2 weeks.   Specialty:  Orthopedic Surgery   Contact information:   [place] [address] [phone]        The results of significant diagnostics from this hospitalization (including imaging, microbiology, ancillary and laboratory) are listed below for reference.    Significant Diagnostic Studies: Dg Chest 2 View  05/16/2013   CLINICAL DATA:  Hypertension. Smoking history. Cough. History of lung cancer.  EXAM: CHEST  2 VIEW  COMPARISON:  03/14/2013  FINDINGS: Right arm PICC has its tip in the SVC just  above the entrance of the innominate vein. Pleural effusion seen previously as much smaller on the right. There is mild right base spine loss. Abnormal interstitial lung markings remain evident bilaterally. No new or worsening finding.  IMPRESSION: Right-sided pleural effusion is smaller than was seen in January, primarily sub pulmonic. Mild right base volume loss.  Abnormal interstitial lung markings diffusely.  Right arm PICC tip pulled back slightly, at the level of the SVC just above the innominate vein.   Electronically Signed   By: Nelson Chimes M.D.   On: 05/16/2013 16:39   Dg Hip Complete Left  05/16/2013   CLINICAL DATA:  Acute left hip pain. The patient has been dizzy with a recent fall, according to Epic note.  EXAM: LEFT HIP - COMPLETE 2+ VIEW  COMPARISON:  None.  FINDINGS: There is slight irregularity of the left greater trochanter; acute nondisplaced fracture cannot be excluded. Femoral neck is intact. No pelvic fractures are evident. There is degenerative change lumbar spine.  IMPRESSION: Cannot exclude nondisplaced fracture left greater trochanter. Correlate clinically for tenderness in this region.   Electronically Signed   By: Rolla Flatten M.D.   On: 05/16/2013 16:33   Mr Jodene Nam Head Wo Contrast  05/19/2013   CLINICAL DATA:  Non-small-cell lung cancer.  EXAM: MRI HEAD WITHOUT AND WITH CONTRAST  MRA HEAD WITHOUT CONTRAST  TECHNIQUE:  Multiplanar, multiecho pulse sequences of the brain and surrounding structures were obtained without and with intravenous contrast. Angiographic images of the head were obtained using MRA technique without contrast.  CONTRAST:  99mL MULTIHANCE GADOBENATE DIMEGLUMINE 529 MG/ML IV SOLN  COMPARISON:  MR HEAD WO/W CM dated 03/15/2013  FINDINGS: MRI HEAD FINDINGS  The diffusion-weighted images demonstrate scattered areas of acute nonhemorrhagic infarction. There multiple punctate areas within the cerebellum bilaterally. A linear focus is present in the right occipital lobe. Additional areas are present in the frontal lobes bilaterally, more anteriorly on the right.  T2 changes are associated with the areas of acute/subacute infarction. These changes were not present previously. There is no evidence for hemorrhage.  Abnormal signal is present in the proximal left vertebral artery. Flow is otherwise present within the major intracranial arteries. The globes and orbits are intact. The paranasal sinuses and mastoid air cells are clear.  Postcontrast images demonstrate a 5 mm lesion in the medial anterior left frontal lobe compatible with a focal metastasis. Skull lesion is present within the squamous portion of the right temporal bone with a extension into the dura adjacent to the right temporal tip. The lesion measures 2.0 x 1.8 x 1.4 cm.  MRA HEAD FINDINGS  The study is mildly degraded by patient motion. Internal carotid arteries demonstrate mild irregularity through the cavernous segment bilaterally without significant stenosis. There is moderate segmental irregularity in the hypoplastic left A1 segment. The right A1 segment is dominant. The anterior communicating artery is patent. There is segmental narrowing of ACA and MCA branch vessels bilaterally without a proximal stenosis or occlusion.  The left vertebral artery is occluded. The right vertebral artery is within normal limits. The PICA vessels are not seen. The  basilar artery is within normal limits. Both posterior cerebral arteries originate from the basilar tip. There is some attenuation of PCA branch vessels bilaterally.  IMPRESSION: 1. Multiple bilateral nonhemorrhagic infarcts involving both the anterior and posterior circulation as described. 2. Right mm medial left frontal lobe metastasis is new. 3. 2.0 x 1.8 x 1.4 cm lesion of the squamous portion of the right  temporal bone with dural extension in the right middle cranial fossa. 4. 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 5. Moderate small vessel disease throughout.  Critical Value/emergent results were called by telephone at the time of interpretation on 05/19/2013 at 4:59 PM to Dr. Marzetta Board , who verbally acknowledged these results.   Electronically Signed   By: Lawrence Santiago M.D.   On: 05/19/2013 16:59   Mr [name] IH Contrast  05/19/2013   CLINICAL DATA:  Non-small-cell lung cancer.  EXAM: MRI HEAD WITHOUT AND WITH CONTRAST  MRA HEAD WITHOUT CONTRAST  TECHNIQUE: Multiplanar, multiecho pulse sequences of the brain and surrounding structures were obtained without and with intravenous contrast. Angiographic images of the head were obtained using MRA technique without contrast.  CONTRAST:  9mL MULTIHANCE GADOBENATE DIMEGLUMINE 529 MG/ML IV SOLN  COMPARISON:  MR HEAD WO/W CM dated 03/15/2013  FINDINGS: MRI HEAD FINDINGS  The diffusion-weighted images demonstrate scattered areas of acute nonhemorrhagic infarction. There multiple punctate areas within the cerebellum bilaterally. A linear focus is present in the right occipital lobe. Additional areas are present in the frontal lobes bilaterally, more anteriorly on the right.  T2 changes are associated with the areas of acute/subacute infarction. These changes were not present previously. There is no evidence for hemorrhage.  Abnormal signal is present in the proximal left vertebral artery. Flow is otherwise present within the  major intracranial arteries. The globes and orbits are intact. The paranasal sinuses and mastoid air cells are clear.  Postcontrast images demonstrate a 5 mm lesion in the medial anterior left frontal lobe compatible with a focal metastasis. Skull lesion is present within the squamous portion of the right temporal bone with a extension into the dura adjacent to the right temporal tip. The lesion measures 2.0 x 1.8 x 1.4 cm.  MRA HEAD FINDINGS  The study is mildly degraded by patient motion. Internal carotid arteries demonstrate mild irregularity through the cavernous segment bilaterally without significant stenosis. There is moderate segmental irregularity in the hypoplastic left A1 segment. The right A1 segment is dominant. The anterior communicating artery is patent. There is segmental narrowing of ACA and MCA branch vessels bilaterally without a proximal stenosis or occlusion.  The left vertebral artery is occluded. The right vertebral artery is within normal limits. The PICA vessels are not seen. The basilar artery is within normal limits. Both posterior cerebral arteries originate from the basilar tip. There is some attenuation of PCA branch vessels bilaterally.  IMPRESSION: 1. Multiple bilateral nonhemorrhagic infarcts involving both the anterior and posterior circulation as described. 2. Right mm medial left frontal lobe metastasis is new. 3. 2.0 x 1.8 x 1.4 cm lesion of the squamous portion of the right temporal bone with dural extension in the right middle cranial fossa. 4. 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 5. Moderate small vessel disease throughout.  Critical Value/emergent results were called by telephone at the time of interpretation on 05/19/2013 at 4:59 PM to Dr. Marzetta Board , who verbally acknowledged these results.   Electronically Signed   By: Lawrence Santiago M.D.   On: 05/19/2013 16:59   Ct Hip Left Wo Contrast  05/19/2013   CLINICAL DATA:  Left hip  pain secondary to a fall. Abnormal radiograph on 05/16/2013 suggests a small avulsion of the left greater trochanter.  EXAM: CT OF THE LEFT HIP WITHOUT CONTRAST  TECHNIQUE: Multidetector CT imaging was performed according to the standard protocol.  Multiplanar CT image reconstructions were also generated.  COMPARISON:  Radiographs dated 05/16/2013  FINDINGS: There is a minimally displaced fracture of the tip of the left greater trochanter at the insertion of the left gluteus minimus tendon. The left femur is otherwise intact. The visualized pelvic bones are normal.  Note is made of a 12 mm soft tissue density in the left inguinal canal, probably a lymph node. There is also a 10 mm lymph node in the left inguinal region on image number 43 of series 3. These are nonspecific.  The patient has degenerative disc disease at L5-S1. There are multiple diverticula in the distal colon.  IMPRESSION: 1. Small avulsion fracture of the tip of the greater trochanter of the proximal left femur. 2. Small lymph nodes in the left inguinal canal and left groin, nonspecific.   Electronically Signed   By: Rozetta Nunnery M.D.   On: 05/19/2013 10:18   Vascular Ultrasound  Carotid Duplex (Doppler) has been completed. Preliminary findings: Bilaterally 1-39% ICA stenosis. Right vertebral is patent and antegrade. Left vertebral is atypical with loss of diastolic component of waveform   Study Conclusions 2 D Echo  - Left ventricle: The cavity size was normal. Systolic function was normal. The estimated ejection fraction was in the range of 55% to 60%. Wall motion was normal; there were no regional wall motion abnormalities. Doppler parameters are consistent with abnormal left ventricular relaxation (grade 1 diastolic dysfunction). - Mitral valve: Mild regurgitation. - Tricuspid valve: Mild-moderate regurgitation. - Pulmonary arteries: Systolic pressure was mildly to moderately increased. PA peak pressure: 81mm Hg (S).  TEE   Study Conclusions  - Left ventricle: The cavity size was normal. Wall thickness was normal. Systolic function was normal. The estimated ejection fraction was in the range of 55% to 60%. Wall motion was normal; there were no regional wall motion abnormalities. - Aortic valve: No evidence of vegetation. - Mitral valve: No evidence of vegetation. - Left atrium: No evidence of thrombus in the atrial cavity or appendage. - Right atrium: No evidence of thrombus in the atrial cavity or appendage. - Atrial septum: No defect or patent foramen ovale was identified. Echo contrast study showed no right-to-left atrial level shunt, following an increase in RA pressure induced by provocative maneuvers. - Tricuspid valve: No evidence of vegetation. - Pulmonic valve: No evidence of vegetation. Impressions:  - No cardiac source of emboli was indentified.   Microbiology: No results found for this or any previous visit (from the past 240 hour(s)).   Labs: Basic Metabolic Panel:  Recent Labs Lab 05/18/13 0335 05/19/13 0335 05/20/13 0350 05/21/13 0518 05/23/13 0347 05/24/13 0355  NA 137  --   --  140 134* 134*  K 4.0  --   --  4.4 5.1 4.3  CL 101  --   --  103 96 95*  CO2 21  --   --  25 22 24   GLUCOSE 126*  --   --  88 79 70  BUN 11  --   --  15 16 14   CREATININE 0.81  --  0.85 0.80 0.82 0.86  CALCIUM 9.5  --   --  10.3 10.7* 11.1*  MG  --  2.1  --   --   --   --    Liver Function Tests: No results found for this basename: AST, ALT, ALKPHOS, BILITOT, PROT, ALBUMIN,  in the last 168 hours No results found for this basename: LIPASE, AMYLASE,  in the last 168  hours No results found for this basename: AMMONIA,  in the last 168 hours CBC:  Recent Labs Lab 05/18/13 0335 05/18/13 1458 05/21/13 0518 05/23/13 0347 05/24/13 0355  WBC 9.8 14.5* 12.5* 15.8* 13.0*  HGB 9.2* 9.8* 9.6* 8.6* 10.3*  HCT 28.9* 29.9* 30.6* 30.6* 33.4*  MCV 95.1 93.7 97.1 96.5 98.2  PLT 139* 197 229 224 225    Cardiac Enzymes: No results found for this basename: CKTOTAL, CKMB, CKMBINDEX, TROPONINI,  in the last 168 hours BNP: BNP (last 3 results)  Recent Labs  03/07/13 1210  PROBNP 933.4*   CBG: No results found for this basename: GLUCAP,  in the last 168 hours  Additional labs: 1. Fasting lipids: Cholesterol 172, triglycerides 153, HDL 44, LDL 97 and VLDL 31 2. Hemoglobin A1c: 5.2 3. Urine Legionella and streptococcal antigen: Negative   Signed:  Vernell Leep, MD, FACP, FHM. [facility] Pager [phone]  If 7PM-7AM, please contact night-coverage [email or web address] Password [email or web address] 05/24/2013, 1:23 PM

## 2013-05-24 NOTE — Progress Notes (Signed)
Report called to Blanch Media at [facility]. Patient's husband is transporting patient. Husband has packet of information to give facility at arrival.  Patient ready for d/c.

## 2013-05-24 NOTE — Progress Notes (Signed)
Physical Therapy Treatment Patient Details Name: Meagan Garcia MRN: [id number] DOB: 10/29/1950 Today's Date: 05/24/2013 Time: 3976-7341 PT Time Calculation (min): 47 min  PT Assessment / Plan / Recommendation  History of Present Illness 63 y.o. female with h/o lung cancer admitted withweakness, and fall. Pt sustained non-surgical L hip fx and is WBAT per chart.   PT Comments   Assisted pt OOB to amb to BR.  Pt requires increased time and <25% VC's on safety with turns.  Amb pt in hallway limited distance with freq sitting rest breaks. Pt progressing slowly and will need ST Rehab at SNF.   Follow Up Recommendations  SNF     Does the patient have the potential to tolerate intense rehabilitation     Barriers to Discharge        Equipment Recommendations       Recommendations for Other Services    Frequency Min 3X/week   Progress towards PT Goals Progress towards PT goals: Progressing toward goals  Plan      Precautions / Restrictions Precautions Precautions: Fall Restrictions Weight Bearing Restrictions: No   Pertinent Vitals/Pain C/o "some" hip pain during amb    Mobility  Bed Mobility Overal bed mobility: Needs Assistance Bed Mobility: Supine to Sit Supine to sit: Min assist;Mod assist General bed mobility comments: increased time, slow and sluggish Transfers Overall transfer level: Needs assistance Equipment used: Rolling walker (2 wheeled) Transfers: Sit to/from Stand Sit to Stand: Mod assist;Min assist General transfer comment: Assist to rise, stabilize, control descent. VCs safety, technique, hand placement plus increased time.  Spouse states pt can not get up to fast "she has to take her time". Ambulation/Gait Ambulation/Gait assistance: +2 safety/equipment;Min assist Ambulation Distance (Feet): 60 Feet (20 feet x 2 sitting rest breaks between) Assistive device: Rolling walker (2 wheeled) Gait Pattern/deviations: Step-to pattern;Decreased stance time -  left Gait velocity: decreased General Gait Details: very slow sluggish gait and long sitting rest breaks between.  Spouse assisted by following with chair. Amb on 2 lts first sats 98%.  Amb on RA sats 96%.      PT Goals (current goals can now be found in the care plan section)    Visit Information  Last PT Received On: 05/24/13 Assistance Needed: +1 History of Present Illness: 63 y.o. female with h/o lung cancer admitted withweakness, and fall. Pt sustained non-surgical L hip fx and is WBAT per chart.    Subjective Data      Cognition       Balance     End of Session PT - End of Session Equipment Utilized During Treatment: Gait belt Activity Tolerance: Patient limited by fatigue Patient left: in chair;with call bell/phone within reach;with family/visitor present   [name]  PTA [facility]  Acute  Rehab Pager      [phone]

## 2013-05-24 NOTE — Progress Notes (Addendum)
Subjective: Patient has no complaints.  Feeling much better and having no pain today.   Objective: Current vital signs: BP 135/67  Pulse 98  Temp(Src) 98.3 F (36.8 C) (Oral)  Resp 20  Ht 5\' 3"  (1.6 m)  Wt 89.2 kg (196 lb 10.4 oz)  BMI 34.84 kg/m2  SpO2 92% Vital signs in last 24 hours: Temp:  [98 F (36.7 C)-98.6 F (37 C)] 98.3 F (36.8 C) (03/17 0523) Pulse Rate:  [95-110] 98 (03/17 0523) Resp:  [13-21] 20 (03/17 0523) BP: (107-169)/(60-93) 135/67 mmHg (03/17 0523) SpO2:  [92 %-100 %] 92 % (03/17 0523)  Intake/Output from previous day: 03/16 0701 - 03/17 0700 In: 360 [P.O.:360] Out: 900 [Urine:900] Intake/Output this shift:   Nutritional status: Cardiac  Neurologic Exam: Mental Status: Alert, oriented, thought content appropriate.  Speech fluent without evidence of aphasia.  Able to follow 3 step commands without difficulty. Cranial Nerves: II: Discs flat bilaterally; Visual fields grossly normal, pupils equal, round, reactive to light and accommodation III,IV, VI: ptosis not present, extra-ocular motions intact bilaterally V,VII: smile symmetric, facial light touch sensation normal bilaterally VIII: hearing normal bilaterally IX,X: gag reflex present XI: bilateral shoulder shrug XII: midline tongue extension without atrophy or fasciculations  Motor: Right : Upper extremity   5/5    Left:     Upper extremity   5/5  Lower extremity   5/5     Lower extremity   4/5 Tone and bulk:normal tone throughout; no atrophy noted Sensory: Pinprick and light touch intact throughout, bilaterally Deep Tendon Reflexes:  Right: Upper Extremity   Left: Upper extremity   biceps (C-5 to C-6) 2/4   biceps (C-5 to C-6) 2/4 tricep (C7) 2/4    triceps (C7) 2/4 Brachioradialis (C6) 2/4  Brachioradialis (C6) 2/4  Lower Extremity Lower Extremity  quadriceps (L-2 to L-4) 2/4   quadriceps (L-2 to L-4) 2/4 Achilles (S1) 1/4   Achilles (S1) 1/4  Plantars: Right: downgoing   Left:  downgoing Cerebellar: normal finger-to-nose,  normal heel-to-shin test    Lab Results: Basic Metabolic Panel:  Recent Labs Lab 05/18/13 0335 05/19/13 0335 05/20/13 0350 05/21/13 0518 05/23/13 0347 05/24/13 0355  NA 137  --   --  140 134* 134*  K 4.0  --   --  4.4 5.1 4.3  CL 101  --   --  103 96 95*  CO2 21  --   --  25 22 24   GLUCOSE 126*  --   --  88 79 70  BUN 11  --   --  15 16 14   CREATININE 0.81  --  0.85 0.80 0.82 0.86  CALCIUM 9.5  --   --  10.3 10.7* 11.1*  MG  --  2.1  --   --   --   --     Liver Function Tests: No results found for this basename: AST, ALT, ALKPHOS, BILITOT, PROT, ALBUMIN,  in the last 168 hours No results found for this basename: LIPASE, AMYLASE,  in the last 168 hours No results found for this basename: AMMONIA,  in the last 168 hours  CBC:  Recent Labs Lab 05/18/13 0335 05/18/13 1458 05/21/13 0518 05/23/13 0347 05/24/13 0355  WBC 9.8 14.5* 12.5* 15.8* 13.0*  HGB 9.2* 9.8* 9.6* 8.6* 10.3*  HCT 28.9* 29.9* 30.6* 30.6* 33.4*  MCV 95.1 93.7 97.1 96.5 98.2  PLT 139* 197 229 224 225   A1c 5.2   Cardiac Enzymes:  Recent Labs Lab 05/17/13 1035  TROPONINI 0.71*    Lipid Panel:  Recent Labs Lab 05/21/13 0518  CHOL 172  TRIG 153*  HDL 44  CHOLHDL 3.9  VLDL 31  LDLCALC 97    CBG: No results found for this basename: GLUCAP,  in the last 168 hours  Microbiology: Results for orders placed in visit on 04/04/13  TECHNOLOGIST REVIEW     Status: None   Collection Time    04/04/13 10:08 AM      Result Value Ref Range Status   Technologist Review Oc myelocyte   Final    Coagulation Studies: No results found for this basename: LABPROT, INR,  in the last 72 hours  Imaging: No results found.  Medications:  Scheduled: . aspirin EC  81 mg Oral Daily  . azithromycin  500 mg Intravenous Q24H  . budesonide-formoterol  2 puff Inhalation BID  . buPROPion  150 mg Oral Daily  . cefTRIAXone (ROCEPHIN)  IV  1 g Intravenous  Q24H  . cholecalciferol  1,000 Units Oral Daily  . enoxaparin (LOVENOX) injection  1 mg/kg Subcutaneous Q12H  . feeding supplement (ENSURE COMPLETE)  237 mL Oral Q24H  . morphine  15 mg Oral Q12H  . nebivolol  10 mg Oral Daily  . predniSONE  20 mg Oral Q breakfast  . sodium chloride  3 mL Intravenous Q12H  . tiotropium  18 mcg Inhalation Daily    *PRELIMINARY RESULTS*  Vascular Ultrasound  Carotid Duplex (Doppler) has been completed. Preliminary findings: Bilaterally 1-39% ICA stenosis. Right vertebral is patent and antegrade. Left vertebral is atypical with loss of diastolic component of waveform   Study Conclusions 2 De cho  - Left ventricle: The cavity size was normal. Systolic function was normal. The estimated ejection fraction was in the range of 55% to 60%. Wall motion was normal; there were no regional wall motion abnormalities. Doppler parameters are consistent with abnormal left ventricular relaxation (grade 1 diastolic dysfunction). - Mitral valve: Mild regurgitation. - Tricuspid valve: Mild-moderate regurgitation. - Pulmonary arteries: Systolic pressure was mildly to moderately increased. PA peak pressure: 46mm Hg (S).   TEE Study Conclusions  - Left ventricle: The cavity size was normal. Wall thickness was normal. Systolic function was normal. The estimated ejection fraction was in the range of 55% to 60%. Wall motion was normal; there were no regional wall motion abnormalities. - Aortic valve: No evidence of vegetation. - Mitral valve: No evidence of vegetation. - Left atrium: No evidence of thrombus in the atrial cavity or appendage. - Right atrium: No evidence of thrombus in the atrial cavity or appendage. - Atrial septum: No defect or patent foramen ovale was identified. Echo contrast study showed no right-to-left atrial level shunt, following an increase in RA pressure induced by provocative maneuvers. - Tricuspid valve: No evidence of vegetation. -  Pulmonic valve: No evidence of vegetation. Impressions:  - No cardiac source of emboli was indentified. Transesophageal echocardiography. 2D and color Doppler. Height: Height: 160cm. Height: 63in. Weight: Weight: 89.1kg. Weight: 196lb. Body mass index: BMI: 34.8kg/m^2. Body surface area: BSA: 2.1m^2. Blood pressure: 156/79. Patient status: Outpatient. Location: Endoscopy    Assessment/Plan: 63 y.o. female presenting with complaints of weakness. 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 Patient had missed three days of her [facility] prior to admission for her DVT. While in hospital she has been placed back on her [facility]. TEE showed no PFO or cardiac source of emboli. Carotid doppler is  negative, LDL and A1C is WNL.   Recommend: 1) Continue AC for DVT 2) Follow up with oncology 3) Follow up out patient with neurology [phone]  Neurology will S/O at this time.    LOS: 8 days   Etta Quill PA-C [facility] [phone]  05/24/2013, 9:33 AM

## 2013-05-24 NOTE — Progress Notes (Signed)
Patient is set to discharge to [facility] today. Patient & husband at bedside aware. Discharge packet given to patient's husband as he is transporting her to the facility. RN, Deidre Ala aware.   Clinical Social Work Department CLINICAL SOCIAL WORK PLACEMENT NOTE 05/24/2013  Patient:  Meagan Garcia, Meagan Garcia  Account Number:  [id number] Admit date:  05/16/2013  Clinical Social Worker:  Renold Genta  Date/time:  05/17/2013 02:54 PM  Clinical Social Work is seeking post-discharge placement for this patient at the following level of care:   SKILLED NURSING   (*CSW will update this form in Epic as items are completed)   05/17/2013  Patient/family provided with [facility] Department of Clinical Social Work's list of facilities offering this level of care within the geographic area requested by the patient (or if unable, by the patient's family).  05/17/2013  Patient/family informed of their freedom to choose among providers that offer the needed level of care, that participate in Medicare, Medicaid or managed care program needed by the patient, have an available bed and are willing to accept the patient.  05/17/2013  Patient/family informed of [facility]' ownership interest in [facility], as well as of the fact that they are under no obligation to receive care at this facility.  PASARR submitted to EDS on 05/17/2013 PASARR number received from EDS on 05/17/2013  FL2 transmitted to all facilities in geographic area requested by pt/family on  05/17/2013 FL2 transmitted to all facilities within larger geographic area on   Patient informed that his/her managed care company has contracts with or will negotiate with  certain facilities, including the following:   BCBS     Patient/family informed of bed offers received:  05/18/2013 Patient chooses bed at [facility], [place] Physician recommends and patient chooses bed at    Patient to be  transferred to [facility] on  05/24/2013 Patient to be transferred to facility by Husband's car  The following physician request were entered in Epic:   Additional Comments:   Raynaldo Opitz, [place] Worker cell #: [phone]

## 2013-05-25 ENCOUNTER — Other Ambulatory Visit: Payer: BC Managed Care – PPO

## 2013-05-25 ENCOUNTER — Ambulatory Visit: Payer: BC Managed Care – PPO | Admitting: Internal Medicine

## 2013-05-26 ENCOUNTER — Telehealth: Payer: Self-pay | Admitting: Internal Medicine

## 2013-05-26 ENCOUNTER — Ambulatory Visit: Payer: BC Managed Care – PPO | Admitting: Radiation Oncology

## 2013-05-26 ENCOUNTER — Encounter: Payer: BC Managed Care – PPO | Admitting: Physical Therapy

## 2013-05-26 NOTE — Telephone Encounter (Signed)
returned pt call and confirmed appt....pt ok and aware

## 2013-05-27 ENCOUNTER — Telehealth: Payer: Self-pay | Admitting: Medical Oncology

## 2013-05-27 DIAGNOSIS — R630 Anorexia: Secondary | ICD-10-CM

## 2013-05-27 MED ORDER — METHYLPREDNISOLONE (PAK) 4 MG PO TABS: ORAL_TABLET | ORAL | Status: DC

## 2013-05-27 NOTE — Telephone Encounter (Signed)
Husband states pt appetite is very poor.

## 2013-05-27 NOTE — Telephone Encounter (Signed)
Per Awilda Metro I called in medrol dose pak and husband notified.

## 2013-05-30 ENCOUNTER — Telehealth: Payer: Self-pay | Admitting: *Deleted

## 2013-05-30 ENCOUNTER — Ambulatory Visit: Payer: BC Managed Care – PPO | Admitting: Internal Medicine

## 2013-05-30 ENCOUNTER — Inpatient Hospital Stay (HOSPITAL_COMMUNITY)
Admission: EM | Admit: 2013-05-30 | Discharge: 2013-06-08 | DRG: 054 | Disposition: E | Payer: BC Managed Care – PPO | Source: Other Acute Inpatient Hospital | Attending: Internal Medicine | Admitting: Internal Medicine

## 2013-05-30 DIAGNOSIS — D638 Anemia in other chronic diseases classified elsewhere: Secondary | ICD-10-CM | POA: Diagnosis present

## 2013-05-30 DIAGNOSIS — Z66 Do not resuscitate: Secondary | ICD-10-CM | POA: Diagnosis not present

## 2013-05-30 DIAGNOSIS — C7931 Secondary malignant neoplasm of brain: Principal | ICD-10-CM | POA: Diagnosis present

## 2013-05-30 DIAGNOSIS — Z515 Encounter for palliative care: Secondary | ICD-10-CM

## 2013-05-30 DIAGNOSIS — S72009A Fracture of unspecified part of neck of unspecified femur, initial encounter for closed fracture: Secondary | ICD-10-CM | POA: Diagnosis present

## 2013-05-30 DIAGNOSIS — C3400 Malignant neoplasm of unspecified main bronchus: Secondary | ICD-10-CM | POA: Diagnosis present

## 2013-05-30 DIAGNOSIS — D485 Neoplasm of uncertain behavior of skin: Secondary | ICD-10-CM

## 2013-05-30 DIAGNOSIS — C7952 Secondary malignant neoplasm of bone marrow: Secondary | ICD-10-CM

## 2013-05-30 DIAGNOSIS — S72002A Fracture of unspecified part of neck of left femur, initial encounter for closed fracture: Secondary | ICD-10-CM | POA: Diagnosis present

## 2013-05-30 DIAGNOSIS — O2230 Deep phlebothrombosis in pregnancy, unspecified trimester: Secondary | ICD-10-CM

## 2013-05-30 DIAGNOSIS — C7951 Secondary malignant neoplasm of bone: Secondary | ICD-10-CM | POA: Diagnosis present

## 2013-05-30 DIAGNOSIS — Z86718 Personal history of other venous thrombosis and embolism: Secondary | ICD-10-CM

## 2013-05-30 DIAGNOSIS — D72829 Elevated white blood cell count, unspecified: Secondary | ICD-10-CM | POA: Diagnosis present

## 2013-05-30 DIAGNOSIS — F3289 Other specified depressive episodes: Secondary | ICD-10-CM | POA: Diagnosis present

## 2013-05-30 DIAGNOSIS — X58XXXA Exposure to other specified factors, initial encounter: Secondary | ICD-10-CM | POA: Diagnosis present

## 2013-05-30 DIAGNOSIS — R4781 Slurred speech: Secondary | ICD-10-CM | POA: Diagnosis present

## 2013-05-30 DIAGNOSIS — T451X5A Adverse effect of antineoplastic and immunosuppressive drugs, initial encounter: Secondary | ICD-10-CM | POA: Diagnosis present

## 2013-05-30 DIAGNOSIS — I619 Nontraumatic intracerebral hemorrhage, unspecified: Secondary | ICD-10-CM | POA: Diagnosis present

## 2013-05-30 DIAGNOSIS — J45909 Unspecified asthma, uncomplicated: Secondary | ICD-10-CM

## 2013-05-30 DIAGNOSIS — I82629 Acute embolism and thrombosis of deep veins of unspecified upper extremity: Secondary | ICD-10-CM | POA: Diagnosis present

## 2013-05-30 DIAGNOSIS — I10 Essential (primary) hypertension: Secondary | ICD-10-CM | POA: Diagnosis present

## 2013-05-30 DIAGNOSIS — E43 Unspecified severe protein-calorie malnutrition: Secondary | ICD-10-CM | POA: Diagnosis present

## 2013-05-30 DIAGNOSIS — I82409 Acute embolism and thrombosis of unspecified deep veins of unspecified lower extremity: Secondary | ICD-10-CM | POA: Diagnosis present

## 2013-05-30 DIAGNOSIS — N179 Acute kidney failure, unspecified: Secondary | ICD-10-CM

## 2013-05-30 DIAGNOSIS — Z79899 Other long term (current) drug therapy: Secondary | ICD-10-CM

## 2013-05-30 DIAGNOSIS — C3491 Malignant neoplasm of unspecified part of right bronchus or lung: Secondary | ICD-10-CM | POA: Diagnosis present

## 2013-05-30 DIAGNOSIS — R778 Other specified abnormalities of plasma proteins: Secondary | ICD-10-CM

## 2013-05-30 DIAGNOSIS — G8929 Other chronic pain: Secondary | ICD-10-CM | POA: Diagnosis present

## 2013-05-30 DIAGNOSIS — Z8249 Family history of ischemic heart disease and other diseases of the circulatory system: Secondary | ICD-10-CM

## 2013-05-30 DIAGNOSIS — R680 Hypothermia, not associated with low environmental temperature: Secondary | ICD-10-CM | POA: Diagnosis not present

## 2013-05-30 DIAGNOSIS — G936 Cerebral edema: Secondary | ICD-10-CM | POA: Diagnosis present

## 2013-05-30 DIAGNOSIS — J189 Pneumonia, unspecified organism: Secondary | ICD-10-CM | POA: Diagnosis present

## 2013-05-30 DIAGNOSIS — Z9221 Personal history of antineoplastic chemotherapy: Secondary | ICD-10-CM

## 2013-05-30 DIAGNOSIS — R1310 Dysphagia, unspecified: Secondary | ICD-10-CM | POA: Diagnosis present

## 2013-05-30 DIAGNOSIS — J309 Allergic rhinitis, unspecified: Secondary | ICD-10-CM

## 2013-05-30 DIAGNOSIS — I214 Non-ST elevation (NSTEMI) myocardial infarction: Secondary | ICD-10-CM | POA: Diagnosis present

## 2013-05-30 DIAGNOSIS — R0603 Acute respiratory distress: Secondary | ICD-10-CM

## 2013-05-30 DIAGNOSIS — I6340 Cerebral infarction due to embolism of unspecified cerebral artery: Secondary | ICD-10-CM | POA: Diagnosis present

## 2013-05-30 DIAGNOSIS — F329 Major depressive disorder, single episode, unspecified: Secondary | ICD-10-CM | POA: Diagnosis present

## 2013-05-30 DIAGNOSIS — Z801 Family history of malignant neoplasm of trachea, bronchus and lung: Secondary | ICD-10-CM

## 2013-05-30 DIAGNOSIS — G9341 Metabolic encephalopathy: Secondary | ICD-10-CM

## 2013-05-30 DIAGNOSIS — R7989 Other specified abnormal findings of blood chemistry: Secondary | ICD-10-CM

## 2013-05-30 DIAGNOSIS — E875 Hyperkalemia: Secondary | ICD-10-CM | POA: Diagnosis not present

## 2013-05-30 DIAGNOSIS — K219 Gastro-esophageal reflux disease without esophagitis: Secondary | ICD-10-CM | POA: Diagnosis present

## 2013-05-30 DIAGNOSIS — IMO0002 Reserved for concepts with insufficient information to code with codable children: Secondary | ICD-10-CM

## 2013-05-30 DIAGNOSIS — E871 Hypo-osmolality and hyponatremia: Secondary | ICD-10-CM

## 2013-05-30 DIAGNOSIS — F411 Generalized anxiety disorder: Secondary | ICD-10-CM | POA: Diagnosis present

## 2013-05-30 DIAGNOSIS — G609 Hereditary and idiopathic neuropathy, unspecified: Secondary | ICD-10-CM | POA: Diagnosis present

## 2013-05-30 DIAGNOSIS — J9600 Acute respiratory failure, unspecified whether with hypoxia or hypercapnia: Secondary | ICD-10-CM | POA: Diagnosis present

## 2013-05-30 DIAGNOSIS — D6481 Anemia due to antineoplastic chemotherapy: Secondary | ICD-10-CM | POA: Diagnosis present

## 2013-05-30 DIAGNOSIS — D696 Thrombocytopenia, unspecified: Secondary | ICD-10-CM | POA: Diagnosis present

## 2013-05-30 DIAGNOSIS — Z8673 Personal history of transient ischemic attack (TIA), and cerebral infarction without residual deficits: Secondary | ICD-10-CM

## 2013-05-30 DIAGNOSIS — I498 Other specified cardiac arrhythmias: Secondary | ICD-10-CM | POA: Diagnosis not present

## 2013-05-30 DIAGNOSIS — C7949 Secondary malignant neoplasm of other parts of nervous system: Principal | ICD-10-CM

## 2013-05-30 NOTE — Progress Notes (Signed)
Pt arrived to [facility] ICU/SD unit via [facility] mobile an personnel. She is alert to self. Tachypnea and wheezing, oxygen via [place] at 4 liters. Respiratory rate in 30's, otherwise VS stable.  Pt speech is somewhat incomprehensible, She follows commands but does not respond appropriately or at all. Foley catheter in place. MD paged upon admission to room.

## 2013-05-30 NOTE — Telephone Encounter (Signed)
Patient's son Orson Slick called with questions about symptoms his mom is having.  Reports "drooling out the right side of her mouth then her speech slurred and she was dazed for a while.  About 20 minutes after symptoms started I checked her heart rate = 108.  I asked her to count forwards and backwards and she could not get to ten and difficulty counting backwards.  She is able to get on her feet and has walked.  Speech is till slurred.  This started about an hour and a half ago."  (Approximately 12:45 pm)  Informed him the drooling, slurred speech and dazed look are enough symptoms that do fall under symptoms of stroke.  Expressed that treatments are time sensitive based on the type of stroke and he needs to get her to the ED as soon as possible.  If he can't get her there to call 911 to get her to the nearest ED.  Andra verbalized undersatnding, ended call and will proceed with these instructions.  Will notify providers.

## 2013-05-31 ENCOUNTER — Inpatient Hospital Stay (HOSPITAL_COMMUNITY): Payer: BC Managed Care – PPO

## 2013-05-31 ENCOUNTER — Encounter: Payer: BC Managed Care – PPO | Admitting: Physical Therapy

## 2013-05-31 ENCOUNTER — Encounter (HOSPITAL_COMMUNITY): Payer: Self-pay | Admitting: General Practice

## 2013-05-31 DIAGNOSIS — C3490 Malignant neoplasm of unspecified part of unspecified bronchus or lung: Secondary | ICD-10-CM

## 2013-05-31 DIAGNOSIS — S72009A Fracture of unspecified part of neck of unspecified femur, initial encounter for closed fracture: Secondary | ICD-10-CM

## 2013-05-31 DIAGNOSIS — K219 Gastro-esophageal reflux disease without esophagitis: Secondary | ICD-10-CM

## 2013-05-31 DIAGNOSIS — J189 Pneumonia, unspecified organism: Secondary | ICD-10-CM | POA: Diagnosis present

## 2013-05-31 DIAGNOSIS — I10 Essential (primary) hypertension: Secondary | ICD-10-CM

## 2013-05-31 DIAGNOSIS — J9600 Acute respiratory failure, unspecified whether with hypoxia or hypercapnia: Secondary | ICD-10-CM

## 2013-05-31 DIAGNOSIS — R4781 Slurred speech: Secondary | ICD-10-CM | POA: Diagnosis present

## 2013-05-31 DIAGNOSIS — C7949 Secondary malignant neoplasm of other parts of nervous system: Principal | ICD-10-CM

## 2013-05-31 DIAGNOSIS — F411 Generalized anxiety disorder: Secondary | ICD-10-CM

## 2013-05-31 DIAGNOSIS — I059 Rheumatic mitral valve disease, unspecified: Secondary | ICD-10-CM

## 2013-05-31 DIAGNOSIS — J45909 Unspecified asthma, uncomplicated: Secondary | ICD-10-CM

## 2013-05-31 DIAGNOSIS — I619 Nontraumatic intracerebral hemorrhage, unspecified: Secondary | ICD-10-CM

## 2013-05-31 DIAGNOSIS — D485 Neoplasm of uncertain behavior of skin: Secondary | ICD-10-CM

## 2013-05-31 DIAGNOSIS — C7931 Secondary malignant neoplasm of brain: Secondary | ICD-10-CM | POA: Diagnosis present

## 2013-05-31 DIAGNOSIS — I6340 Cerebral infarction due to embolism of unspecified cerebral artery: Secondary | ICD-10-CM

## 2013-05-31 DIAGNOSIS — R799 Abnormal finding of blood chemistry, unspecified: Secondary | ICD-10-CM

## 2013-05-31 DIAGNOSIS — I82409 Acute embolism and thrombosis of unspecified deep veins of unspecified lower extremity: Secondary | ICD-10-CM

## 2013-05-31 LAB — COMPREHENSIVE METABOLIC PANEL
ALT: 36 U/L — AB (ref 0–35)
AST: 36 U/L (ref 0–37)
Albumin: 2.1 g/dL — ABNORMAL LOW (ref 3.5–5.2)
Alkaline Phosphatase: 170 U/L — ABNORMAL HIGH (ref 39–117)
BUN: 15 mg/dL (ref 6–23)
CALCIUM: 12 mg/dL — AB (ref 8.4–10.5)
CO2: 24 mEq/L (ref 19–32)
Chloride: 96 mEq/L (ref 96–112)
Creatinine, Ser: 0.95 mg/dL (ref 0.50–1.10)
GFR calc Af Amer: 73 mL/min — ABNORMAL LOW (ref >90)
GFR calc non Af Amer: 63 mL/min — ABNORMAL LOW (ref >90)
Glucose, Bld: 89 mg/dL (ref 70–99)
Potassium: 4.5 mEq/L (ref 3.7–5.3)
SODIUM: 133 meq/L — AB (ref 137–147)
Total Bilirubin: 0.7 mg/dL (ref 0.3–1.2)
Total Protein: 5.9 g/dL — ABNORMAL LOW (ref 6.0–8.3)

## 2013-05-31 LAB — BLOOD GAS, ARTERIAL
Acid-Base Excess: 1.5 mmol/L (ref 0.0–2.0)
Bicarbonate: 23.7 mEq/L (ref 20.0–24.0)
DRAWN BY: 232811
O2 CONTENT: 6 L/min
O2 Saturation: 91.4 %
PCO2 ART: 29.5 mmHg — AB (ref 35.0–45.0)
PO2 ART: 61.5 mmHg — AB (ref 80.0–100.0)
Patient temperature: 98.2
TCO2: 21.6 mmol/L (ref 0–100)
pH, Arterial: 7.516 — ABNORMAL HIGH (ref 7.350–7.450)

## 2013-05-31 LAB — CBC WITH DIFFERENTIAL/PLATELET
BASOS ABS: 0 10*3/uL (ref 0.0–0.1)
BASOS PCT: 0 % (ref 0–1)
EOS ABS: 0 10*3/uL (ref 0.0–0.7)
Eosinophils Relative: 0 % (ref 0–5)
HEMATOCRIT: 33.2 % — AB (ref 36.0–46.0)
HEMOGLOBIN: 10.3 g/dL — AB (ref 12.0–15.0)
LYMPHS PCT: 3 % — AB (ref 12–46)
Lymphs Abs: 0.8 10*3/uL (ref 0.7–4.0)
MCH: 30.3 pg (ref 26.0–34.0)
MCHC: 31 g/dL (ref 30.0–36.0)
MCV: 97.6 fL (ref 78.0–100.0)
MONOS PCT: 10 % (ref 3–12)
Monocytes Absolute: 2.6 10*3/uL — ABNORMAL HIGH (ref 0.1–1.0)
NEUTROS ABS: 22.2 10*3/uL — AB (ref 1.7–7.7)
NEUTROS PCT: 87 % — AB (ref 43–77)
Platelets: 60 10*3/uL — ABNORMAL LOW (ref 150–400)
RBC: 3.4 MIL/uL — ABNORMAL LOW (ref 3.87–5.11)
RDW: 19.1 % — ABNORMAL HIGH (ref 11.5–15.5)
WBC: 25.6 10*3/uL — AB (ref 4.0–10.5)

## 2013-05-31 LAB — MRSA PCR SCREENING: MRSA by PCR: NEGATIVE

## 2013-05-31 LAB — PROTIME-INR
INR: 1.42 (ref 0.00–1.49)
Prothrombin Time: 17 seconds — ABNORMAL HIGH (ref 11.6–15.2)

## 2013-05-31 LAB — TROPONIN I: Troponin I: 1.19 ng/mL (ref <0.30)

## 2013-05-31 LAB — APTT: APTT: 34 s (ref 24–37)

## 2013-05-31 MED ORDER — TIOTROPIUM BROMIDE MONOHYDRATE 18 MCG IN CAPS: 18.0000 ug | ORAL_CAPSULE | Freq: Every day | RESPIRATORY_TRACT | Status: DC

## 2013-05-31 MED ORDER — MORPHINE SULFATE 2 MG/ML IJ SOLN
2.0000 mg | INTRAMUSCULAR | Status: DC | PRN
  Administered 2013-05-31 – 2013-06-02 (×7): 2 mg via INTRAVENOUS
  Filled 2013-05-31 (×7): qty 1

## 2013-05-31 MED ORDER — DEXAMETHASONE SODIUM PHOSPHATE 4 MG/ML IJ SOLN
4.0000 mg | Freq: Four times a day (QID) | INTRAMUSCULAR | Status: DC
  Filled 2013-05-31 (×5): qty 1

## 2013-05-31 MED ORDER — DEXAMETHASONE SODIUM PHOSPHATE 4 MG/ML IJ SOLN
4.0000 mg | Freq: Four times a day (QID) | INTRAMUSCULAR | Status: DC
  Administered 2013-05-31 – 2013-06-02 (×9): 4 mg via INTRAVENOUS
  Filled 2013-05-31 (×9): qty 1

## 2013-05-31 MED ORDER — VANCOMYCIN HCL IN DEXTROSE 1-5 GM/200ML-% IV SOLN
1000.0000 mg | Freq: Once | INTRAVENOUS | Status: AC
  Administered 2013-05-31: 1000 mg via INTRAVENOUS
  Filled 2013-05-31: qty 200

## 2013-05-31 MED ORDER — IPRATROPIUM-ALBUTEROL 0.5-2.5 (3) MG/3ML IN SOLN
3.0000 mL | RESPIRATORY_TRACT | Status: DC | PRN
  Administered 2013-05-31: 3 mL via RESPIRATORY_TRACT
  Filled 2013-05-31: qty 3

## 2013-05-31 MED ORDER — ONDANSETRON HCL 4 MG/2ML IJ SOLN
4.0000 mg | Freq: Four times a day (QID) | INTRAMUSCULAR | Status: DC | PRN
  Filled 2013-05-31: qty 2

## 2013-05-31 MED ORDER — MAGNESIUM SULFATE 40 MG/ML IJ SOLN
2.0000 g | Freq: Once | INTRAMUSCULAR | Status: AC
  Administered 2013-05-31: 2 g via INTRAVENOUS
  Filled 2013-05-31: qty 50

## 2013-05-31 MED ORDER — VITAMINS A & D EX OINT
TOPICAL_OINTMENT | CUTANEOUS | Status: AC
  Administered 2013-05-31: 5
  Filled 2013-05-31: qty 5

## 2013-05-31 MED ORDER — DEXTROSE 5 % IV SOLN
2.0000 g | Freq: Once | INTRAVENOUS | Status: AC
  Administered 2013-05-31: 2 g via INTRAVENOUS
  Filled 2013-05-31: qty 2

## 2013-05-31 MED ORDER — SODIUM CHLORIDE 0.9 % IJ SOLN
3.0000 mL | Freq: Two times a day (BID) | INTRAMUSCULAR | Status: DC
  Administered 2013-05-31 – 2013-06-02 (×5): 3 mL via INTRAVENOUS

## 2013-05-31 MED ORDER — ALBUTEROL SULFATE (2.5 MG/3ML) 0.083% IN NEBU
2.5000 mg | INHALATION_SOLUTION | Freq: Four times a day (QID) | RESPIRATORY_TRACT | Status: DC | PRN
  Administered 2013-05-31: 2.5 mg via RESPIRATORY_TRACT
  Filled 2013-05-31: qty 3

## 2013-05-31 MED ORDER — FLUTICASONE PROPIONATE 50 MCG/ACT NA SUSP
2.0000 | Freq: Every day | NASAL | Status: DC
  Administered 2013-05-31 – 2013-06-01 (×2): 2 via NASAL
  Filled 2013-05-31: qty 16

## 2013-05-31 MED ORDER — IPRATROPIUM-ALBUTEROL 0.5-2.5 (3) MG/3ML IN SOLN: 3.0000 mL | RESPIRATORY_TRACT | Status: DC

## 2013-05-31 MED ORDER — BUDESONIDE-FORMOTEROL FUMARATE 160-4.5 MCG/ACT IN AERO
2.0000 | INHALATION_SPRAY | Freq: Two times a day (BID) | RESPIRATORY_TRACT | Status: DC
Start: 2013-05-31 — End: 2013-06-02
  Administered 2013-05-31 – 2013-06-01 (×3): 2 via RESPIRATORY_TRACT
  Filled 2013-05-31: qty 6

## 2013-05-31 MED ORDER — ONDANSETRON HCL 4 MG PO TABS: 4.0000 mg | ORAL_TABLET | Freq: Four times a day (QID) | ORAL | Status: DC | PRN

## 2013-05-31 MED ORDER — DEXAMETHASONE SODIUM PHOSPHATE 10 MG/ML IJ SOLN
10.0000 mg | Freq: Once | INTRAMUSCULAR | Status: AC
  Administered 2013-05-31: 10 mg via INTRAVENOUS
  Filled 2013-05-31 (×2): qty 1

## 2013-05-31 MED ORDER — ALBUTEROL SULFATE HFA 108 (90 BASE) MCG/ACT IN AERS: 2.0000 | INHALATION_SPRAY | Freq: Four times a day (QID) | RESPIRATORY_TRACT | Status: DC | PRN

## 2013-05-31 MED ORDER — IPRATROPIUM-ALBUTEROL 0.5-2.5 (3) MG/3ML IN SOLN
3.0000 mL | Freq: Four times a day (QID) | RESPIRATORY_TRACT | Status: DC
  Administered 2013-05-31 – 2013-06-02 (×11): 3 mL via RESPIRATORY_TRACT
  Filled 2013-05-31 (×11): qty 3

## 2013-05-31 MED ORDER — SODIUM CHLORIDE 0.9 % IV SOLN
INTRAVENOUS | Status: DC
  Administered 2013-05-31 – 2013-06-02 (×3): via INTRAVENOUS

## 2013-05-31 MED ORDER — VANCOMYCIN HCL IN DEXTROSE 1-5 GM/200ML-% IV SOLN
1000.0000 mg | Freq: Two times a day (BID) | INTRAVENOUS | Status: DC
  Administered 2013-05-31 – 2013-06-02 (×4): 1000 mg via INTRAVENOUS
  Filled 2013-05-31 (×6): qty 200

## 2013-05-31 MED ORDER — DEXTROSE 5 % IV SOLN
2.0000 g | Freq: Two times a day (BID) | INTRAVENOUS | Status: DC
  Administered 2013-05-31 (×2): 2 g via INTRAVENOUS
  Filled 2013-05-31 (×3): qty 2

## 2013-05-31 NOTE — Progress Notes (Signed)
05/31/13. am troponin 1.19. Pt is not and has not c/o any chest pain since arrival. Pt is here with ICH so can not use Heparin or ASA at this time. EKG in ED unremarkable. Ordered EKG for 8am for comparison to first one. Will leave report for am oncoming attending about troponin and defer consult to cardio to attending.  Clance Boll, NP [facility]

## 2013-05-31 NOTE — Progress Notes (Signed)
Chaplain assisted with [place].  Provided support with pt and son, Orson Slick, at bedside.

## 2013-05-31 NOTE — H&P (Signed)
[facility] History and Physical  Patient: Meagan Garcia  XAJ:[id number]  DOB: [birth year]  DOS: the patient was seen and examined on 05/31/2013 PCP: [name], MD  Chief Complaint: Slurred speech  HPI: Meagan Garcia is a 62 y.o. 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 The patient is coming from SNF. The patient was recently admitted to the hospital for CVA and right upper extremity DVT. After thorough workup she was sent to a nursing facility for rehabilitation. Today while she was eating she started having difficulty chewing and then she had drooling from her right side of the mouth. This was followed by difficulty with her speech. They called her primary doctor recommended to come to the ED and the patient was taken to the [facility]. Patient was taken there as a code stroke, emergent CT scan was showing possible hemorrhage which was followed by an MRI which confirmed the finding with metastasis and the patient was sent here for work further workup. At the time of my evaluation the patient continued to have the slurred speech but denied any fever, chills, headache, cough, chest pain, palpitation, shortness of breath, nausea, vomiting, abdominal pain, bleeding, burning urination, dizziness, pedal edema. The son provided the same history who is the primary caregiver for the patient. Patient's husband lives in [place].  Review of Systems: as mentioned in the history of present illness.  A Comprehensive review of the other systems is negative.  Past Medical History  Diagnosis Date  . Anxiety   . Depression   . Peripheral neuropathy     feet: unknown etiol  . Elevated glucose   . Allergic rhinitis   . GERD (gastroesophageal reflux disease)   . Complication of anesthesia w/tubal    got anest. "went crazy"    . Non-small cell lung cancer    Past Surgical History  Procedure Laterality Date  . Abdominal hysterectomy      partial  . Tubal reversal    . Tubal ligation      x2  . Fibroid tumors    . Knee arthroscopy    . Foot surgery      cysts from both feet  . Video bronchoscopy Bilateral 02/21/2013    Procedure: VIDEO BRONCHOSCOPY WITHOUT FLUORO;  Surgeon: Doree Fudge, MD;  Location: [facility] ENDOSCOPY;  Service: Cardiopulmonary;  Laterality: Bilateral;  . Tee without cardioversion N/A 05/23/2013    Procedure: TRANSESOPHAGEAL ECHOCARDIOGRAM (TEE);  Surgeon: Pixie Casino, MD;  Location: [facility] ENDOSCOPY;  Service: Cardiovascular;  Laterality: N/A;   Social History:  reports that she quit smoking about 17 years ago. She has never used smokeless tobacco. She reports that she does not drink alcohol or use illicit drugs. 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  No Known Allergies  Family History  Problem Relation Age of Onset  . Hypertension Other   . Hypertension Mother   . Cancer Mother 20    lung ca  . Cancer Father     Prior to Admission medications   Medication Sig Start Date End Date Taking? Authorizing Provider  albuterol (PROVENTIL HFA;VENTOLIN HFA) 108 (90 BASE) MCG/ACT inhaler Inhale 2 puffs into the lungs every 6 (six) hours as needed for wheezing or shortness of breath. 04/06/13  Yes Evie Lacks Plotnikov, MD  budesonide-formoterol (SYMBICORT) 160-4.5 MCG/ACT inhaler Inhale 2 puffs into the lungs 2 (  two) times daily. 04/06/13  Yes Evie Lacks Plotnikov, MD  buPROPion (ZYBAN) 150 MG 12 hr tablet Take 150 mg by mouth daily.   Yes Historical Provider, MD  cholecalciferol (VITAMIN D) 1000 UNITS tablet Take 1 tablet (1,000 Units total) by mouth daily. 02/23/13 05/25/15 Yes Domenic Polite, MD  enoxaparin (LOVENOX) 150 MG/ML injection Inject 140 mg into the skin daily.   Yes Historical Provider, MD  nebivolol (BYSTOLIC) 10 MG tablet Take 1 tablet (10 mg total) by mouth daily. 04/06/13  Yes  Evie Lacks Plotnikov, MD  oxyCODONE (OXY IR/ROXICODONE) 5 MG immediate release tablet Take 1 tablet (5 mg total) by mouth every 6 (six) hours as needed for breakthrough pain. 05/24/13  Yes Modena Jansky, MD  predniSONE (DELTASONE) 10 MG tablet Take 1 tablet (10 mg total) by mouth daily with breakfast. Stop after 05/28/2013 dose. 05/25/13  Yes Modena Jansky, MD  prochlorperazine (COMPAZINE) 10 MG tablet Take 1 tablet (10 mg total) by mouth every 6 (six) hours as needed for nausea or vomiting. 02/26/13  Yes Curt Bears, MD  tiotropium (SPIRIVA HANDIHALER) 18 MCG inhalation capsule Place 1 capsule (18 mcg total) into inhaler and inhale daily. 04/06/13  Yes Evie Lacks Plotnikov, MD  fluticasone (FLONASE) 50 MCG/ACT nasal spray Place 2 sprays into the nose daily. 04/14/12   Evie Lacks Plotnikov, MD  hyaluronate sodium (RADIAPLEXRX) GEL Apply 1 application topically 2 (two) times daily. Apply to chest area being treated with radiation after rad tx and bedtime when skin becomes itchy or irritation/erythema  And on weekedns as well 03/08/13   Thea Silversmith, MD  morphine (MS CONTIN) 15 MG 12 hr tablet Take 1 tablet (15 mg total) by mouth every 12 (twelve) hours. 05/24/13   Modena Jansky, MD    Physical Exam: Filed Vitals:   05/31/13 0000 05/31/13 0034  Temp: 97.5 F (36.4 C)   TempSrc: Oral   SpO2:  93%    General: Alert, Awake and Oriented to Time, Place and Person. Appear in moderate distress Eyes: PERRL ENT: Oral Mucosa clear moist. Neck: Difficult to assess JVD Cardiovascular: S1 and S2 Present, aortic systolic Murmur, Peripheral Pulses Present Respiratory: Bilateral Air entry equal and Decreased, no Crackles, bilateral expiratory wheezes, right more than left Abdomen: Bowel Sound Present, Soft and Non tender Skin: No Rash Extremities: No Pedal edema, no calf tenderness Neurologic: Mental status slurred speech, able to follow 3 point command, not oriented to time, lack of attention,  Cranial Nerves pupils are reactive extraocular muscle movement intact no nystagmus, Motor strength bilaterally equal strength in upper extremity and lower extremity, Sensation present to light touch, reflexes present, babinski equivocal, Cerebellar test limited finger-nose-finger due to inability to follow command.  Labs on Admission:  CBC:  Recent Labs Lab 05/24/13 0355  WBC 13.0*  HGB 10.3*  HCT 33.4*  MCV 98.2  PLT 225    CMP     Component Value Date/Time   NA 133* 05/31/2013 0111   NA 138 04/18/2013 0955   K 4.5 05/31/2013 0111   K 3.6 04/18/2013 0955   CL 96 05/31/2013 0111   CO2 24 05/31/2013 0111   CO2 22 04/18/2013 0955   GLUCOSE 89 05/31/2013 0111   GLUCOSE 141* 04/18/2013 0955   BUN 15 05/31/2013 0111   BUN 5.6* 04/18/2013 0955   CREATININE 0.95 05/31/2013 0111   CREATININE 0.8 04/18/2013 0955   CALCIUM 12.0* 05/31/2013 0111   CALCIUM 9.0 04/18/2013 0955   PROT 5.9* 05/31/2013 0111  PROT 6.1* 04/18/2013 0955   ALBUMIN 2.1* 05/31/2013 0111   ALBUMIN 2.9* 04/18/2013 0955   AST PENDING 05/31/2013 0111   AST 15 04/18/2013 0955   ALT 36* 05/31/2013 0111   ALT 25 04/18/2013 0955   ALKPHOS 170* 05/31/2013 0111   ALKPHOS 69 04/18/2013 0955   BILITOT 0.7 05/31/2013 0111   BILITOT 0.50 04/18/2013 0955   GFRNONAA 63* 05/31/2013 0111   GFRAA 73* 05/31/2013 0111    No results found for this basename: LIPASE, AMYLASE,  in the last 168 hours No results found for this basename: AMMONIA,  in the last 168 hours  No results found for this basename: CKTOTAL, CKMB, CKMBINDEX, TROPONINI,  in the last 168 hours BNP (last 3 results)  Recent Labs  03/07/13 1210  PROBNP 933.4*    Radiological Exams on Admission: No results found.  EKG: Independently reviewed. sinus tachycardia.  Assessment/Plan Principal Problem:   Metastatic cancer to brain Active Problems:   DEPRESSION   HYPERTENSION   GERD   Acute respiratory failure   Non-small cell cancer of right lung   DVT (deep venous thrombosis)   Hip  fracture, left   Cerebral embolism with cerebral infarction   Slurred speech   HCAP (healthcare-associated pneumonia)   ICH (intracerebral hemorrhage)   1. Metastatic cancer to brain The patient is presenting with complaints of slurred speech which is new onset. Extensive workup in the [facility] was suggestive of new intracranial hemorrhage in the left parasagital area without any midline shift, with vasogenic edema, associated with other areas of bleeding in the right occipital lobe, right temporal lobe, right frontal lobe. I discussed the case with on-call radiologist who confirmed that she has possibility 4 metastatic lesions in her brain with hemorrhage in some. It also appears that she has on new thoracic vertebra lesion, which was not present in December 2014. She has been through one course of chemotherapy and radiation. With such aggressive disease her prognosis is guarded. I discussed the case with on-call oncologist, who recommended to treat the patient with Decadron for vasogenic edema, obtain neurosurgery consult in the morning. Further discussion with the primary provider for the patient her son was done, who will be visiting the patient early in the morning under that request the patient to be full code and continue with the current treatment. With more than 3 lesions, and active bleeding patient is less likely a surgical candidate. She has new onset thrombocytopenia which could be the cause of her bleeding but with her history of DVT correction of this is not recommended, this was discussed with on-call oncologist. She has been on Lovenox for her DVT which has to be stopped due to ongoing bleeding and low platelets. Patient will be kept n.p.o. and serial neuro checks will be done. Repeat CT scan shows stable hemorrhage would continue to observe. She may require radiation oncology for further treatment if decided by family.  2. Acute respiratory failure with  hypoxia CT scan of the chest done in the other facility did not show any significant change from her current CT scan last admission, persistent right-sided hilar mass with obstruction of the right mainstem bronchus which is progressed further to involve right upper and lower bronchus, lymphadenopathy which is progressed further, possible postobstructive changes with postobstructive pneumonia. Patient will be kept on oxygen and BiPAP as needed Blood cultures will be obtained Patient will be placed on IV vancomycin and cefepime for possible healthcare associated pneumonia with sputum cultures will  be obtained as well, ABG and chest x-ray for the followup   3.Thrombocytopenia  Possibly due to ongoing infection, HIT due to Lovenox, marrow suppression due to cancer. With normal INR and APTT DIC is less likely. Would continue to observe hold on platelet transfusion due to presence of DVT.  4.DVT Patient has extensive right upper extremity DVT At present restricting any use of right upper extremity. Unfortunately with her ongoing bleeding intracranially treatment for DVT is contraindicated.  5. Left-sided hip fracture Patient is limited mobility and requires assistance for transfers, bed mobility and ambulation. PTOT consultation.  Consults: Medical oncology, VIA phone neurology, neurosurgery  DVT Prophylaxis: mechanical compression device Nutrition: N.p.o.  Code Status: Full  Family Communication: Son was called on his phone, opportunity was given to ask question and all questions were answered satisfactorily at the time of interview. Disposition: Admitted to inpatient in step-down unit.  Author: Berle Mull, MD [facility] Pager: [phone] 05/31/2013, 1:44 AM    If 7PM-7AM, please contact night-coverage [email or web address] Password [email or web address]

## 2013-05-31 NOTE — Evaluation (Signed)
Clinical/Bedside Swallow Evaluation Patient Details  Name: Meagan Garcia MRN: [id number] Date of Birth: [birth year]  Today's Date: 05/31/2013 Time: 1012-1042 SLP Time Calculation (min): 30 min  Past Medical History:  Past Medical History  Diagnosis Date  . Anxiety   . Depression   . Peripheral neuropathy     feet: unknown etiol  . Elevated glucose   . Allergic rhinitis   . GERD (gastroesophageal reflux disease)   . Complication of anesthesia w/tubal    got anest. "went crazy"   . Non-small cell lung cancer    Past Surgical History:  Past Surgical History  Procedure Laterality Date  . Abdominal hysterectomy      partial  . Tubal reversal    . Tubal ligation      x2  . Fibroid tumors    . Knee arthroscopy    . Foot surgery      cysts from both feet  . Video bronchoscopy Bilateral 02/21/2013    Procedure: VIDEO BRONCHOSCOPY WITHOUT FLUORO;  Surgeon: Doree Fudge, MD;  Location: [facility] ENDOSCOPY;  Service: Cardiopulmonary;  Laterality: Bilateral;  . Tee without cardioversion N/A 05/23/2013    Procedure: TRANSESOPHAGEAL ECHOCARDIOGRAM (TEE);  Surgeon: Pixie Casino, MD;  Location: [facility] ENDOSCOPY;  Service: Cardiovascular;  Laterality: N/A;   HPI:  63 yo female adm to [facility] from a SNF with right sided labial drooling and problems chewing.  Pt has stage 3 lung cancer with bone/brain mets, peripheral neuropathy, left hip fx, probable new ICH left parasagittal with edema impacting frontal and parietal lobes.  Per review of EMS report, pt was eating with food coming from her mouth and wheeze, shortness of breath.  CXR 3/24 showed ? pulmonary edema or possible pna, new right upper lobe partial collapse.  Bedside swallow evaluation ordered.  RN reports pt not to be following directions today.     Assessment / Plan / Recommendation Clinical Impression  Pt demonstrates multiple risk factors for dysphagia/aspiration risk including ? whitish coating in mouth that may be  consistent with thrush, increased RR up to 26 during evaluation, cognitive deficits resulting in oral holding during multiple respiratory cycles and medical dx.  No overt coughing with intake, however throat clear noted x 1 after delayed swallow of thin water. Significant oral holding noted with applesauce, cracker with multiple respiratory cycles and SLP required verbal cueing to swallow.   Pt did respond well to cue to swallow as soon as food/drink is placed in mouth.  Pt admits to dysphagia PTA indicating more difficulties with food pointing to esophagus.    SLP educated pt to possible options and precautions if MD initiated diet.  Will follow to aid in pt's care/dysphagia management.   Options include: 1.  pursue MBS given neuro dx and possible silent aspiration and poor sensation to pharyngeal stasis  2. continue NPO due to level of difficulties with pt's respiratory status/swallowing 3. initiate modified diet of puree/nectar with very strict precautions    Given pt for pallaitive referral, ? goals of care for nutrition.  Defer to MD for next plan of care.       Aspiration Risk  Moderate    Diet Recommendation NPO;Dysphagia 1 (Puree);Nectar-thick liquid (tsps of thin water between meals)   Liquid Administration via: Cup Medication Administration: Crushed with puree Supervision: Full supervision/cueing for compensatory strategies Compensations: Slow rate;Small sips/bites;Check for pocketing (stop intake if pt is short of breath, make sure pt swallows before giving more intake) Postural Changes and/or Swallow Maneuvers: Seated  upright 90 degrees;Upright 30-60 min after meal    Other  Recommendations Recommended Consults: MBS (consider MBS versus initiation of po diet) Oral Care Recommendations: Oral care before and after PO Other Recommendations: Order thickener from pharmacy   Follow Up Recommendations  Skilled Nursing facility    Frequency and Duration min 2x/week  2 weeks    Pertinent Vitals/Pain Afebrile, decreased     Swallow Study Prior Functional Status   see hhx    General Date of Onset: 05/31/13 HPI: 63 yo female adm to [facility] from a SNF with right sided labial drooling and problems chewing.  Pt has stage 3 lung cancer with bone/brain mets, peripheral neuropathy, left hip fx, probable new ICH left parasagittal with edema impacting frontal and parietal lobes.  Per review of EMS report, pt was eating with food coming from her mouth and wheeze, shortness of breath.  CXR 3/24 showed ? pulmonary edema or possible pna, new right upper lobe partial collapse.  Bedside swallow evaluation ordered.  RN reports pt not to be following directions today.   Type of Study: Bedside swallow evaluation Diet Prior to this Study: NPO Temperature Spikes Noted: No Respiratory Status: Nasal cannula (required Bipap earlier today) Behavior/Cognition: Alert Oral Cavity - Dentition: Adequate natural dentition Self-Feeding Abilities: Needs assist;Able to feed self Baseline Vocal Quality: Low vocal intensity Volitional Cough: Strong Volitional Swallow: Unable to elicit    Oral/Motor/Sensory Function Overall Oral Motor/Sensory Function: Impaired Labial ROM:  (generalized weakness) Labial Strength: Reduced Labial Sensation: Reduced Lingual ROM: Reduced left;Reduced right Lingual Strength: Reduced Facial ROM: Reduced right Facial Symmetry:  (right weakness) Facial Strength: Reduced Facial Sensation: Reduced Velum: Within Functional Limits (sluggish) Mandible: Impaired   Ice Chips Ice chips: Impaired Presentation: Self Fed Oral Phase Impairments: Reduced lingual movement/coordination;Impaired anterior to posterior transit Oral Phase Functional Implications: Prolonged oral transit   Thin Liquid Thin Liquid: Impaired Presentation: Cup;Straw;Spoon Oral Phase Impairments: Reduced lingual movement/coordination;Impaired anterior to posterior transit Oral Phase Functional  Implications: Prolonged oral transit;Right anterior spillage;Oral holding Pharyngeal  Phase Impairments: Throat Clearing - Immediate;Suspected delayed Swallow;Multiple swallows    Nectar Thick Nectar Thick Liquid: Impaired Presentation: Cup;Straw;Spoon Oral Phase Impairments: Reduced lingual movement/coordination;Impaired anterior to posterior transit Oral phase functional implications: Right anterior spillage;Prolonged oral transit;Oral holding Pharyngeal Phase Impairments: Suspected delayed Swallow;Multiple swallows   Honey Thick Honey Thick Liquid: Not tested   Puree Puree: Impaired Presentation: Spoon Oral Phase Impairments: Reduced lingual movement/coordination;Impaired anterior to posterior transit Oral Phase Functional Implications: Prolonged oral transit Pharyngeal Phase Impairments: Suspected delayed Swallow   Solid   GO    Solid: Impaired Presentation: Self Fed Oral Phase Impairments: Impaired anterior to posterior transit;Reduced lingual movement/coordination;Impaired mastication Oral Phase Functional Implications: Oral holding Pharyngeal Phase Impairments: Suspected delayed Elonda Husky, [facility] SLP [phone]

## 2013-05-31 NOTE — Progress Notes (Signed)
ANTIBIOTIC CONSULT NOTE - INITIAL  Pharmacy Consult for vancomycin, cefepime Indication: pneumonia  No Known Allergies  Patient Measurements: Height: 5\' 3"  (160 cm) Weight: 196 lb (88.905 kg) IBW/kg (Calculated) : 52.4 Adjusted Body Weight:   Vital Signs: Temp: 97.6 F (36.4 C) (03/24 0200) Temp src: Oral (03/24 0000) BP: 123/61 mmHg (03/24 0518) Pulse Rate: 114 (03/24 0518) Intake/Output from previous day:   Intake/Output from this shift:    Labs:  Recent Labs  05/31/13 0111  WBC 25.6*  HGB 10.3*  PLT 60*  CREATININE 0.95   Estimated Creatinine Clearance: 65.8 ml/min (by C-G formula based on Cr of 0.95). No results found for this basename: VANCOTROUGH, Corlis Leak, VANCORANDOM, Murphy, GENTPEAK, GENTRANDOM, TOBRATROUGH, TOBRAPEAK, TOBRARND, AMIKACINPEAK, AMIKACINTROU, AMIKACIN,  in the last 72 hours   Microbiology: Recent Results (from the past 720 hour(s))  MRSA PCR SCREENING     Status: None   Collection Time    05/09/2013 11:39 PM      Result Value Ref Range Status   MRSA by PCR NEGATIVE  NEGATIVE Final   Comment:            The GeneXpert MRSA Assay (FDA     approved for NASAL specimens     only), is one component of a     comprehensive MRSA colonization     surveillance program. It is not     intended to diagnose MRSA     infection nor to guide or     monitor treatment for     MRSA infections.    Medical History: Past Medical History  Diagnosis Date  . Anxiety   . Depression   . Peripheral neuropathy     feet: unknown etiol  . Elevated glucose   . Allergic rhinitis   . GERD (gastroesophageal reflux disease)   . Complication of anesthesia w/tubal    got anest. "went crazy"   . Non-small cell lung cancer     Medications:  Anti-infectives   Start     Dose/Rate Route Frequency Ordered Stop   05/31/13 1200  vancomycin (VANCOCIN) IVPB 1000 mg/200 mL premix     1,000 mg 200 mL/hr over 60 Minutes Intravenous Every 12 hours 05/31/13 0536     05/31/13 1000  ceFEPIme (MAXIPIME) 2 g in dextrose 5 % 50 mL IVPB     2 g 100 mL/hr over 30 Minutes Intravenous Every 12 hours 05/31/13 0536     05/31/13 0230  vancomycin (VANCOCIN) IVPB 1000 mg/200 mL premix     1,000 mg 200 mL/hr over 60 Minutes Intravenous  Once 05/31/13 0215 05/31/13 0430   05/31/13 0230  ceFEPIme (MAXIPIME) 2 g in dextrose 5 % 50 mL IVPB     2 g 100 mL/hr over 30 Minutes Intravenous  Once 05/31/13 0215 05/31/13 0400     Assessment: Patient with PNA.  First dose of antibiotics already given.  Goal of Therapy:  Vancomycin trough level 15-20 mcg/ml Cefepime dosed based on patient weight and renal function  Plan:  Measure antibiotic drug levels at steady state Follow up culture results Vancomycin 1gm iv q12hr, cefepime 2gm iv q12hr  Nani Skillern Crowford 05/31/2013,5:39 AM

## 2013-05-31 NOTE — Progress Notes (Signed)
H and P from this AM reviewed. Pt with multiple medical problems, including metastatic NSCLC, DVT, recent CVA who presents with new slurred speech, found to have multiple brain mets on MRI and new thoracic vertebral lesion. Presently BiPAP dependent. Oncology and Palliative Care were consulted. Of note, troponins were elevated. As pt is not a heparin candidate (bleeding brain met) and likely not heart cath candidate (BiPAP dependent in the setting of other acute medical issues), will monitor for now and await input from Oncology/Palliative Care. Presently FULL CODE. Had discussed the above with the patient's son at the bedside. He is aware of and agrees with the plan of care as noted. All questions answered

## 2013-05-31 NOTE — Progress Notes (Signed)
UR completed. Roman Sandall RN CCM Case Mgmt phone [phone] 

## 2013-05-31 NOTE — Progress Notes (Signed)
  Echocardiogram 2D Echocardiogram has been performed.  Meagan Garcia 05/31/2013, 2:50 PM 

## 2013-06-01 ENCOUNTER — Other Ambulatory Visit: Payer: Self-pay

## 2013-06-01 ENCOUNTER — Ambulatory Visit: Payer: BC Managed Care – PPO | Admitting: Internal Medicine

## 2013-06-01 ENCOUNTER — Other Ambulatory Visit: Payer: BC Managed Care – PPO

## 2013-06-01 DIAGNOSIS — Z0289 Encounter for other administrative examinations: Secondary | ICD-10-CM

## 2013-06-01 DIAGNOSIS — Z515 Encounter for palliative care: Secondary | ICD-10-CM

## 2013-06-01 DIAGNOSIS — D649 Anemia, unspecified: Secondary | ICD-10-CM

## 2013-06-01 DIAGNOSIS — D696 Thrombocytopenia, unspecified: Secondary | ICD-10-CM

## 2013-06-01 DIAGNOSIS — C773 Secondary and unspecified malignant neoplasm of axilla and upper limb lymph nodes: Secondary | ICD-10-CM

## 2013-06-01 DIAGNOSIS — Z86718 Personal history of other venous thrombosis and embolism: Secondary | ICD-10-CM

## 2013-06-01 DIAGNOSIS — C770 Secondary and unspecified malignant neoplasm of lymph nodes of head, face and neck: Secondary | ICD-10-CM

## 2013-06-01 LAB — BASIC METABOLIC PANEL
BUN: 23 mg/dL (ref 6–23)
CALCIUM: 12 mg/dL — AB (ref 8.4–10.5)
CHLORIDE: 100 meq/L (ref 96–112)
CO2: 22 meq/L (ref 19–32)
Creatinine, Ser: 0.93 mg/dL (ref 0.50–1.10)
GFR calc Af Amer: 75 mL/min — ABNORMAL LOW (ref >90)
GFR calc non Af Amer: 65 mL/min — ABNORMAL LOW (ref >90)
GLUCOSE: 122 mg/dL — AB (ref 70–99)
Potassium: 4.3 mEq/L (ref 3.7–5.3)
Sodium: 136 mEq/L — ABNORMAL LOW (ref 137–147)

## 2013-06-01 LAB — CBC
HEMATOCRIT: 31 % — AB (ref 36.0–46.0)
Hemoglobin: 9.8 g/dL — ABNORMAL LOW (ref 12.0–15.0)
MCH: 30.2 pg (ref 26.0–34.0)
MCHC: 31.6 g/dL (ref 30.0–36.0)
MCV: 95.7 fL (ref 78.0–100.0)
PLATELETS: 29 10*3/uL — AB (ref 150–400)
RBC: 3.24 MIL/uL — AB (ref 3.87–5.11)
RDW: 19 % — ABNORMAL HIGH (ref 11.5–15.5)
WBC: 30 10*3/uL — AB (ref 4.0–10.5)

## 2013-06-01 LAB — ABO/RH: ABO/RH(D): O POS

## 2013-06-01 MED ORDER — LORAZEPAM 2 MG/ML IJ SOLN
0.5000 mg | INTRAMUSCULAR | Status: DC | PRN
  Administered 2013-06-02: 0.5 mg via INTRAVENOUS
  Filled 2013-06-01: qty 1

## 2013-06-01 MED ORDER — VITAMINS A & D EX OINT
TOPICAL_OINTMENT | CUTANEOUS | Status: AC
Start: 2013-06-01 — End: 2013-06-01
  Administered 2013-06-01: 5
  Filled 2013-06-01: qty 5

## 2013-06-01 MED ORDER — PIPERACILLIN-TAZOBACTAM 3.375 G IVPB
3.3750 g | Freq: Three times a day (TID) | INTRAVENOUS | Status: DC
  Administered 2013-06-01 – 2013-06-02 (×3): 3.375 g via INTRAVENOUS
  Filled 2013-06-01 (×5): qty 50

## 2013-06-01 MED ORDER — BISACODYL 10 MG RE SUPP: 10.0000 mg | Freq: Every day | RECTAL | Status: DC | PRN

## 2013-06-01 NOTE — Consult Note (Signed)
[place]  [phone]    HOSPITAL PROGRESS NOTE  DIAGNOSIS: Stage IIIB (T2b, N3, M0) non-small cell lung cancer, invasive squamous cell carcinoma diagnosed in December of 2014.  Primary site: Lung (Right)  Staging method: AJCC 7th Edition  Clinical: Stage IIIB (T2b, N3, M0)  Summary: Stage IIIB (T2b, N3, M0)   PRIOR THERAPY: None   CURRENT THERAPY: Concurrent chemoradiation with weekly carboplatin for an AUC of 2 and paclitaxel at 45 mg per meter square for total of 6-7 weeks depending on the final dose of radiation. She status post 6 cycles.  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   on this admission, patient had  New onset of slurred speech. Extensive workup in the [facility] was suggestive of new intracranial hemorrhage in the left parasagital area without any midline shift, with vasogenic edema, associated with other areas of bleeding in the right occipital lobe, right temporal lobe, right frontal lobe. At least 4 lesion in the brain were noted by radiologist.Also new, a thoracic vertebral lesion. She was started on Decadron for vasogenic edema. Lovenox was discontinued due to thrombocytopenia in the setting of anticoagulation for DVT. She also is being treated for acute respiratory failure due to progression of R hilar mass with R mainstem obstruction and possible post obstructive pneumonia. She in on Vanco, and Zosyn.  We were informed of the patient's admission to determine goals of care. She is Full Code.  MEDICATIONS:  Scheduled Meds: . budesonide-formoterol  2 puff Inhalation BID  . dexamethasone  4 mg Intravenous Q6H  . fluticasone  2 spray Each Nare Daily  .  ipratropium-albuterol  3 mL Nebulization Q6H  . piperacillin-tazobactam (ZOSYN)  IV  3.375 g Intravenous 3 times per day  . sodium chloride  3 mL Intravenous Q12H  . vancomycin  1,000 mg Intravenous Q12H   Continuous Infusions: . sodium chloride 100 mL/hr at 06/01/13 0830   PRN Meds:.albuterol, bisacodyl, ipratropium-albuterol, LORazepam, morphine injection, ondansetron (ZOFRAN) IV, ondansetron  ALLERGIES:  No Known Allergies   PHYSICAL EXAMINATION:   Filed Vitals:   06/01/13 0800  BP: 134/87  Pulse: 97  Temp: 97.5 F (36.4 C)  Resp: 23     Filed Weights   05/31/13 0534 06/01/13 0500  Weight: 196 lb (88.905 kg) 203 lb 0.7 oz (92.94 kg)    63 year old African American Female  in no acute distress. Flat affect General well-developed and thin   Lungs clear bilaterally . No wheezing,  Right rhonchi or rales. Cardiac: regular rate and rhythm,no murmur , rubs or gallops Abdomen soft nontender , bowel sounds x4. No hepatosplenomegaly Extremities no clubbing cyanosis , RUE edema. No  petechial rash Neuro: as per HPI, remarkable for slurred speech.   LABORATORY/RADIOLOGY DATA:   Recent Labs Lab 05/31/13 0111  WBC 25.6*  HGB 10.3*  HCT 33.2*  PLT 60*  MCV 97.6  MCH 30.3  MCHC 31.0  RDW 19.1*  LYMPHSABS 0.8  MONOABS 2.6*  EOSABS 0.0  BASOSABS 0.0    CMP    Recent Labs Lab 05/31/13 0111  NA 133*  K 4.5  CL 96  CO2 24  GLUCOSE 89  BUN 15  CREATININE 0.95  CALCIUM  12.0*  AST 36  ALT 36*  ALKPHOS 170*  BILITOT 0.7        Component Value Date/Time   BILITOT 0.7 05/31/2013 0111   BILITOT 0.50 04/18/2013 0955   BILIDIR 0.1 08/20/2009 0802      Recent Labs Lab 05/31/13 0111  INR 1.42      Urinalysis    Component Value Date/Time   COLORURINE YELLOW 05/16/2013 1958   APPEARANCEUR CLOUDY* 05/16/2013 1958   LABSPEC 1.017 05/16/2013 1958   PHURINE 6.0 05/16/2013 1958   GLUCOSEU NEGATIVE 05/16/2013 1958   HGBUR TRACE* 05/16/2013 1958   BILIRUBINUR  NEGATIVE 05/16/2013 1958   KETONESUR NEGATIVE 05/16/2013 1958   PROTEINUR 30* 05/16/2013 1958   UROBILINOGEN 1.0 05/16/2013 1958   NITRITE POSITIVE* 05/16/2013 1958   LEUKOCYTESUR SMALL* 05/16/2013 1958    Liver Function Tests:  Recent Labs Lab 05/31/13 0111  AST 36  ALT 36*  ALKPHOS 170*  BILITOT 0.7  PROT 5.9*  ALBUMIN 2.1*    Cardiac Enzymes:  Recent Labs Lab 05/31/13 0520  TROPONINI 1.19*     Radiology Studies:  Dg Chest 2 View  05/16/2013   CLINICAL DATA:  Hypertension. Smoking history. Cough. History of lung cancer.  EXAM: CHEST  2 VIEW  COMPARISON:  03/14/2013  FINDINGS: Right arm PICC has its tip in the SVC just above the entrance of the innominate vein. Pleural effusion seen previously as much smaller on the right. There is mild right base spine loss. Abnormal interstitial lung markings remain evident bilaterally. No new or worsening finding.  IMPRESSION: Right-sided pleural effusion is smaller than was seen in January, primarily sub pulmonic. Mild right base volume loss.  Abnormal interstitial lung markings diffusely.  Right arm PICC tip pulled back slightly, at the level of the SVC just above the innominate vein.   Electronically Signed   By: Nelson Chimes M.D.   On: 05/16/2013 16:39   Dg Hip Complete Left  05/16/2013   CLINICAL DATA:  Acute left hip pain. The patient has been dizzy with a recent fall, according to Epic note.  EXAM: LEFT HIP - COMPLETE 2+ VIEW  COMPARISON:  None.  FINDINGS: There is slight irregularity of the left greater trochanter; acute nondisplaced fracture cannot be excluded. Femoral neck is intact. No pelvic fractures are evident. There is degenerative change lumbar spine.  IMPRESSION: Cannot exclude nondisplaced fracture left greater trochanter. Correlate clinically for tenderness in this region.   Electronically Signed   By: Rolla Flatten M.D.   On: 05/16/2013 16:33   Ct Head Wo Contrast  05/31/2013   CLINICAL DATA:  Hemorrhagic infarction conversion.  History of non-small cell lung cancer.  EXAM: CT HEAD WITHOUT CONTRAST  TECHNIQUE: Contiguous axial images were obtained from the base of the skull through the vertex without intravenous contrast.  COMPARISON:  05/14/2013 head CT from [facility]  FINDINGS: 7 mm hemorrhagic lesion in the medial left frontal lobe with small amount of surrounding edema is unchanged. 7 mm hyperdense focus in the right occipital lobe is also unchanged without evidence of significant surrounding edema. Edema in the right temporal lobe associated with the right temporal bone lesion is grossly unchanged but not as well evaluated due to artifact. There is no evidence of acute large territory cortical infarct. There is no to midline shift or extra-axial fluid collection. Cerebral volume is within normal limits for age. The mastoid air cells and visualized paranasal sinuses are clear. Orbits are unremarkable.  IMPRESSION: Unchanged appearance of  small foci of acute hemorrhage in the left frontal lobe and right occipital lobe. No midline shift or evidence of new intracranial hemorrhage.   Electronically Signed   By: Logan Bores   On: 05/31/2013 02:10   Mr Jodene Nam Head Wo Contrast  05/19/2013   CLINICAL DATA:  Non-small-cell lung cancer.  EXAM: MRI HEAD WITHOUT AND WITH CONTRAST  MRA HEAD WITHOUT CONTRAST  TECHNIQUE: Multiplanar, multiecho pulse sequences of the brain and surrounding structures were obtained without and with intravenous contrast. Angiographic images of the head were obtained using MRA technique without contrast.  CONTRAST:  31mL MULTIHANCE GADOBENATE DIMEGLUMINE 529 MG/ML IV SOLN  COMPARISON:  MR HEAD WO/W CM dated 03/15/2013  FINDINGS: MRI HEAD FINDINGS  The diffusion-weighted images demonstrate scattered areas of acute nonhemorrhagic infarction. There multiple punctate areas within the cerebellum bilaterally. A linear focus is present in the right occipital lobe. Additional areas are present in the frontal lobes  bilaterally, more anteriorly on the right.  T2 changes are associated with the areas of acute/subacute infarction. These changes were not present previously. There is no evidence for hemorrhage.  Abnormal signal is present in the proximal left vertebral artery. Flow is otherwise present within the major intracranial arteries. The globes and orbits are intact. The paranasal sinuses and mastoid air cells are clear.  Postcontrast images demonstrate a 5 mm lesion in the medial anterior left frontal lobe compatible with a focal metastasis. Skull lesion is present within the squamous portion of the right temporal bone with a extension into the dura adjacent to the right temporal tip. The lesion measures 2.0 x 1.8 x 1.4 cm.  MRA HEAD FINDINGS  The study is mildly degraded by patient motion. Internal carotid arteries demonstrate mild irregularity through the cavernous segment bilaterally without significant stenosis. There is moderate segmental irregularity in the hypoplastic left A1 segment. The right A1 segment is dominant. The anterior communicating artery is patent. There is segmental narrowing of ACA and MCA branch vessels bilaterally without a proximal stenosis or occlusion.  The left vertebral artery is occluded. The right vertebral artery is within normal limits. The PICA vessels are not seen. The basilar artery is within normal limits. Both posterior cerebral arteries originate from the basilar tip. There is some attenuation of PCA branch vessels bilaterally.  IMPRESSION: 1. Multiple bilateral nonhemorrhagic infarcts involving both the anterior and posterior circulation as described. 2. Right mm medial left frontal lobe metastasis is new. 3. 2.0 x 1.8 x 1.4 cm lesion of the squamous portion of the right temporal bone with dural extension in the right middle cranial fossa. 4. 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 5. Moderate small vessel disease throughout.  Critical  Value/emergent results were called by telephone at the time of interpretation on 05/19/2013 at 4:59 PM to Dr. Marzetta Board , who verbally acknowledged these results.   Electronically Signed   By: Lawrence Santiago M.D.   On: 05/19/2013 16:59   Mr [name] OV Contrast  05/19/2013   CLINICAL DATA:  Non-small-cell lung cancer.  EXAM: MRI HEAD WITHOUT AND WITH CONTRAST  MRA HEAD WITHOUT CONTRAST  TECHNIQUE: Multiplanar, multiecho pulse sequences of the brain and surrounding structures were obtained without and with intravenous contrast. Angiographic images of the head were obtained using MRA technique without contrast.  CONTRAST:  34mL MULTIHANCE GADOBENATE DIMEGLUMINE 529 MG/ML IV SOLN  COMPARISON:  MR HEAD WO/W CM dated 03/15/2013  FINDINGS: MRI HEAD FINDINGS  The diffusion-weighted images demonstrate  scattered areas of acute nonhemorrhagic infarction. There multiple punctate areas within the cerebellum bilaterally. A linear focus is present in the right occipital lobe. Additional areas are present in the frontal lobes bilaterally, more anteriorly on the right.  T2 changes are associated with the areas of acute/subacute infarction. These changes were not present previously. There is no evidence for hemorrhage.  Abnormal signal is present in the proximal left vertebral artery. Flow is otherwise present within the major intracranial arteries. The globes and orbits are intact. The paranasal sinuses and mastoid air cells are clear.  Postcontrast images demonstrate a 5 mm lesion in the medial anterior left frontal lobe compatible with a focal metastasis. Skull lesion is present within the squamous portion of the right temporal bone with a extension into the dura adjacent to the right temporal tip. The lesion measures 2.0 x 1.8 x 1.4 cm.  MRA HEAD FINDINGS  The study is mildly degraded by patient motion. Internal carotid arteries demonstrate mild irregularity through the cavernous segment bilaterally without significant  stenosis. There is moderate segmental irregularity in the hypoplastic left A1 segment. The right A1 segment is dominant. The anterior communicating artery is patent. There is segmental narrowing of ACA and MCA branch vessels bilaterally without a proximal stenosis or occlusion.  The left vertebral artery is occluded. The right vertebral artery is within normal limits. The PICA vessels are not seen. The basilar artery is within normal limits. Both posterior cerebral arteries originate from the basilar tip. There is some attenuation of PCA branch vessels bilaterally.  IMPRESSION: 1. Multiple bilateral nonhemorrhagic infarcts involving both the anterior and posterior circulation as described. 2. Right mm medial left frontal lobe metastasis is new. 3. 2.0 x 1.8 x 1.4 cm lesion of the squamous portion of the right temporal bone with dural extension in the right middle cranial fossa. 4. 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 5. Moderate small vessel disease throughout.  Critical Value/emergent results were called by telephone at the time of interpretation on 05/19/2013 at 4:59 PM to Dr. Marzetta Board , who verbally acknowledged these results.   Electronically Signed   By: Lawrence Santiago M.D.   On: 05/19/2013 16:59   Ct Hip Left Wo Contrast  05/19/2013   CLINICAL DATA:  Left hip pain secondary to a fall. Abnormal radiograph on 05/16/2013 suggests a small avulsion of the left greater trochanter.  EXAM: CT OF THE LEFT HIP WITHOUT CONTRAST  TECHNIQUE: Multidetector CT imaging was performed according to the standard protocol. Multiplanar CT image reconstructions were also generated.  COMPARISON:  Radiographs dated 05/16/2013  FINDINGS: There is a minimally displaced fracture of the tip of the left greater trochanter at the insertion of the left gluteus minimus tendon. The left femur is otherwise intact. The visualized pelvic bones are normal.  Note is made of a 12 mm soft tissue density  in the left inguinal canal, probably a lymph node. There is also a 10 mm lymph node in the left inguinal region on image number 43 of series 3. These are nonspecific.  The patient has degenerative disc disease at L5-S1. There are multiple diverticula in the distal colon.  IMPRESSION: 1. Small avulsion fracture of the tip of the greater trochanter of the proximal left femur. 2. Small lymph nodes in the left inguinal canal and left groin, nonspecific.   Electronically Signed   By: Rozetta Nunnery M.D.   On: 05/19/2013 10:18   Dg Chest [facility] [address]  05/31/2013   CLINICAL DATA:  Right hilar mass compressing main stem bronchus, possible pneumonia.  EXAM: PORTABLE CHEST - 1 VIEW  COMPARISON:  DG CHEST 2 VIEW dated 05/16/2013; CT ANGIO CHEST W/CM &/OR WO/CM dated 03/07/2013  FINDINGS: Diffuse interstitial prominence with superimposed patchy alveolar airspace opacities. Partial right upper lobe collapse. Elevated right hemidiaphragm. Possible small right pleural effusion. No pneumothorax. Multiple EKG lines overlie the patient and may obscure subtle underlying pathology. Soft tissue planes and included osseous structures are nonsuspicious. Interval removal of right PICC.  IMPRESSION: Diffuse interstitial and to lesser extent alveolar airspace opacities could reflect pulmonary edema or possibly pneumonia with new partial right upper lobe collapse.   Electronically Signed   By: Elon Alas   On: 05/31/2013 05:09       ASSESSMENT AND PLAN:   1. Metastatic NSCLCa to the brain,  neck and axilla. Admitted with slurred speech secondary to hemorrhagic brain mets. Repeat CT of the head, shows stable hemorrhage: left parasagital area without any midline shift, with vasogenic edema, associated with other areas of bleeding in the right occipital lobe, right temporal lobe, right frontal lobe. Currently on Dexamethasone an seizure precautions.  2.Acute respiratory failure with hypoxia likely secondary to progression of her  right-sided hilar mass with obstruction of the right mainstem bronchus and possible postobstructive pneumonia. On Oxygen, bronchodilators, IV Vanco and Zosyn  3. History of RUE DVT with Lovenox on hold due to hemorrhagic cerebral edema, in the setting of thrombocytopenia  4. Anemia and thrombocytopenia. Monitor.  5. Full Code. However, goals of care are being addresses. Palliative care evaluation in progress.  Rondel Jumbo, PA-C 06/01/2013, 10:25 AM  Hematology/oncology Attending: The patient is seen and examined today. Her husband was at the bedside. I agree with the above note. She is a very pleasant 63 years old [place] American female with metastatic non-small cell lung cancer, squamous cell carcinoma who was initially diagnosed as a stage IIIB status post a course of concurrent chemoradiation. Unfortunately the patient has significant deterioration in her condition recently with new stroke as well as new brain lesion in addition to progressive systemic disease. She was also on anticoagulation with Lovenox for deep venous thrombosis of the right upper extremities. This is currently on hold secondary to hemorrhagic cerebral edema with thrombocytopenia. The patient was admitted recently with slurred speech and mental status change. She also has significant dyspnea secondary to progression of her right lung cancer and obstruction of the right mainstem bronchus with questionable postobstructive pneumonia. Unfortunately the patient has very poor prognosis with multiple comorbidities in addition to the progressive lung cancer. I discussed with the patient and her husband consideration of palliative care at this point. The patient is not a good candidate for systemic treatment with her current condition, but I may consider it in the future if she has improvement in her performance status. Her thrombocytopenia is multifactorial and could be secondary to HIT or sepsis and polypharmacy. Will continue  to monitor for now. Thank you so much for taking good care of [name]. I will continue to follow the patient with you and assist in her management an as-needed basis. Please call if you have any questions.

## 2013-06-01 NOTE — Consult Note (Signed)
Patient W[place]:Meagan Garcia      DOB: 1950/09/12      RSW:[id number]     Consult Note from the Palliative Medicine Team at [facility] Requested by: Dr. Posey Pronto     PCP: Meagan Kehr, MD Reason for Consultation: [place] and options.    Phone Number:[phone]  Assessment of patients Current state: 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 Recently hospitalized (3/9-3/17) for stroke and RUE DVT (anticoagulation contraindicated d/t hemorrhagic brain lesions) when she was found to have progression of her cancer with metastatic lymphadenopathy to neck and axilla. Unable to tolerate anticoagulation d/t hemorrhagic CVA. She has been requiring BiPAP and has not been progressing.   I met today with her son, Meagan Garcia [phone]), and daughter, Meagan Garcia [phone]). Her husband is not present for meeting but is Meagan Garcia [phone]). Meagan Garcia is HCPOA. I discussed extensively Meagan Garcia's very poor prognosis and her declining health and also her limited options for treatment considering her weakened state (including complications: CVA, DVT). Me[facility] Garcia is understanding the short and long term implications and wishes to focus on comfort; he says that even if she does recover from this hospitalization that she will not be strong enough to handle systemic treatment for her metastatic disease. Tashia cannot quite process the realization of her mother's approaching death and even says that "my mind cannot process anything past her recovering." Meagan Garcia is not comfortable with any limitations (including code status) at this time and is very tearful throughout the conversation. Meagan Garcia attempts to explain his point of view and opinion but both remain tearful. I also attempted to discuss what we would be hoping to accomplish by doing aggressive treatments such as CPR and mechanical  intubation but Meagan Garcia is not there yet and needs to process this information. No decision could be agreed upon so Meagan Garcia wishes to continue full treatment and keep her full code for now. I will continue to follow and support Meagan Garcia and her family come to terms with her illness and prognosis.    Goals of Care: 1.  Code Status: FULL   2. Scope of Treatment: Continue all available medical treatment at this time.    4. Disposition: To be determined on outcomes.    3. Symptom Management:   1. Anxiety/Agitation: Ativan prn.  2. Pain: Morphine prn.  3. Bowel Regimen: Duclolax supp prn.  4. Nausea/Vomiting: Ondansetron prn.   4. Psychosocial: Emotional support provided to patient and family during difficult conversation.   5. Spiritual: Support from personal church.   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 Recently hospitalized (3/9-3/17) for stroke and RUE DVT (anticoagulation contraindicated d/t hemorrhagic brain lesions) when she was found to have progression of her cancer with metastatic lymphadenopathy to neck and axilla. Unable to tolerate anticoagulation d/t hemorrhagic CVA. She has been requiring BiPAP and has not been progressing.    ROS: Unable to elicit - confused and on BiPAP.     PMH:  Past Medical History  Diagnosis Date  . Anxiety   . Depression   . Peripheral neuropathy     feet: unknown etiol  . Elevated glucose   . Allergic rhinitis   . GERD (gastroesophageal reflux disease)   . Complication of  anesthesia w/tubal    got anest. "went crazy"   . Non-small cell lung cancer      PSH: Past Surgical History  Procedure Laterality Date  . Abdominal hysterectomy      partial  . Tubal reversal    . Tubal ligation      x2  . Fibroid tumors    . Knee arthroscopy    . Foot surgery      cysts from both feet  . Video  bronchoscopy Bilateral 02/21/2013    Procedure: VIDEO BRONCHOSCOPY WITHOUT FLUORO;  Surgeon: Doree Fudge, MD;  Location: [facility] END[facility];  Service: Cardiopulmonary;  Laterality: Bilateral;  . Tee without cardioversion N/A 05/23/2013    Procedure: TRANSESOPHAGEAL ECHOCARDIOGRAM (TEE);  Surgeon: Pixie Casino, MD;  Location: [facility] ENDOSCOPY;  Service: Cardiovascular;  Laterality: N/A;   I have reviewed the [place] and SH and  If appropriate update it with new information. No Known Allergies Scheduled Meds: . budesonide-formoterol  2 puff Inhalation BID  . dexamethasone  4 mg Intravenous Q6H  . fluticasone  2 spray Each Nare Daily  . ipratropium-albuterol  3 mL Nebulization Q6H  . piperacillin-tazobactam (ZOSYN)  IV  3.375 g Intravenous 3 times per day  . sodium chloride  3 mL Intravenous Q12H  . vancomycin  1,000 mg Intravenous Q12H   Continuous Infusions: . sodium chloride 100 mL/hr at 06/01/13 0830   PRN Meds:.albuterol, bisacodyl, ipratropium-albuterol, LORazepam, morphine injection, ondansetron (ZOFRAN) IV, ondansetron    BP 119/78  Pulse 98  Temp(Src) 97.5 F (36.4 C) (Oral)  Resp 24  Ht 5' 3"  (1.6 m)  Wt 92.1 kg (203 lb 0.7 oz)  BMI 35.98 kg/m2  SpO2 97%   PPS: 30%   Intake/Output Summary (Last 24 hours) at 06/01/13 1229 Last data filed at 06/01/13 1200  Gross per 24 hour  Intake 1387.5 ml  Output    980 ml  Net  407.5 ml   LBM: 06/01/13                         Physical Exam:  General:  NAD, resting HEENT: [place]/AT, dry mucouc membranes, on BiPAP Chest: Right sided rhonchi, clear left sides, diminishes throughout, no labored breathing, symmetric CVS: RRR, S1 S2 Abdomen: Soft, NT, ND, +BS Ext: Warm to touch, radial and DP pulses 2+ Neuro: Lethargic, confused, unable to follow commands  Labs: CBC    Component Value Date/Time   WBC 30.0* 06/01/2013 1030   WBC 2.5* 04/18/2013 0954   RBC 3.24* 06/01/2013 1030   RBC 3.46* 04/18/2013 0954   HGB 9.8* 06/01/2013  1030   HGB 9.8* 04/18/2013 0954   HCT 31.0* 06/01/2013 1030   HCT 31.0* 04/18/2013 0954   PLT 29* 06/01/2013 1030   PLT 148 04/18/2013 0954   MCV 95.7 06/01/2013 1030   MCV 89.6 04/18/2013 0954   MCH 30.2 06/01/2013 1030   MCH 28.3 04/18/2013 0954   MCHC 31.6 06/01/2013 1030   MCHC 31.6 04/18/2013 0954   RDW 19.0* 06/01/2013 1030   RDW 19.2* 04/18/2013 0954   LYMPHSABS 0.8 05/31/2013 0111   LYMPHSABS 0.4* 04/18/2013 0954   MONOABS 2.6* 05/31/2013 0111   MONOABS 0.2 04/18/2013 0954   EOSABS 0.0 05/31/2013 0111   EOSABS 0.0 04/18/2013 0954   BASOSABS 0.0 05/31/2013 0111   BASOSABS 0.0 04/18/2013 0954    BMET    Component Value Date/Time   NA 136* 06/01/2013 1030   NA 138 04/18/2013 0955  K 4.3 06/01/2013 1030   K 3.6 04/18/2013 0955   CL 100 06/01/2013 1030   CO2 22 06/01/2013 1030   CO2 22 04/18/2013 0955   GLUCOSE 122* 06/01/2013 1030   GLUCOSE 141* 04/18/2013 0955   BUN 23 06/01/2013 1030   BUN 5.6* 04/18/2013 0955   CREATININE 0.93 06/01/2013 1030   CREATININE 0.8 04/18/2013 0955   CALCIUM 12.0* 06/01/2013 1030   CALCIUM 9.0 04/18/2013 0955   GFRNONAA 65* 06/01/2013 1030   GFRAA 75* 06/01/2013 1030    CMP     Component Value Date/Time   NA 136* 06/01/2013 1030   NA 138 04/18/2013 0955   K 4.3 06/01/2013 1030   K 3.6 04/18/2013 0955   CL 100 06/01/2013 1030   CO2 22 06/01/2013 1030   CO2 22 04/18/2013 0955   GLUCOSE 122* 06/01/2013 1030   GLUCOSE 141* 04/18/2013 0955   BUN 23 06/01/2013 1030   BUN 5.6* 04/18/2013 0955   CREATININE 0.93 06/01/2013 1030   CREATININE 0.8 04/18/2013 0955   CALCIUM 12.0* 06/01/2013 1030   CALCIUM 9.0 04/18/2013 0955   PROT 5.9* 05/31/2013 0111   PROT 6.1* 04/18/2013 0955   ALBUMIN 2.1* 05/31/2013 0111   ALBUMIN 2.9* 04/18/2013 0955   AST 36 05/31/2013 0111   AST 15 04/18/2013 0955   ALT 36* 05/31/2013 0111   ALT 25 04/18/2013 0955   ALKPHOS 170* 05/31/2013 0111   ALKPHOS 69 04/18/2013 0955   BILITOT 0.7 05/31/2013 0111   BILITOT 0.50 04/18/2013 0955   GFRNONAA 65* 06/01/2013 1030   GFRAA 75* 06/01/2013  1030     Time In Time Out Total Time Spent with Patient Total Overall Time  1145 1300 66mn 775m    Greater than 50%  of this time was spent counseling and coordinating care related to the above assessment and plan.  AlVinie SillNP Palliative Medicine Team Pager # [phone]-F 8a-5p) Team Phone # [phone]/Weekends)

## 2013-06-01 NOTE — Progress Notes (Addendum)
CRITICAL VALUE ALERT  Critical value received:  Platelets 29  Date of notification:  06/01/13  Time of notification:  2025  Critical value read back: yes  Nurse who received alert:  Geannie Risen, RN  MD notified (1st page):  Dr. Sheran Fava  Time of first page:  1152  MD notified (2nd page):  Time of second page:  Responding MD:  Dr. Sheran Fava  Time MD responded: [phone]

## 2013-06-01 NOTE — Progress Notes (Signed)
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   -  Repeat CT head with stable hemorrhage:  left parasagital area without any midline shift, with vasogenic edema, associated with other areas of bleeding in the right occipital lobe, right temporal lobe, right frontal lobe -  Continue dexamethasone -  She has been through one course of chemotherapy and radiation with progression -  serial neuro checks -  Continue seizure precautions -  PCT and oncology consultations >> GOC important for progression of care.  XRT or stent for breathing vs. Hospice  Acute respiratory failure with hypoxia likely secondary to progression of her right-sided hilar mass with obstruction of the right mainstem bronchus and possible postobstructive pneumonia.  -  Continue nasal canula with BiPAP as needed  -  BD scheduled and prn -  Continue symbicort -  Blood cultures NGTD -  Continue IV vancomycin  day 2  -  Change cefepime to zosyn for post-obstructive PNA  DVT RUE without significant edema, a/c dc'd due to [place]   Left-sided hip fracture with limited mobility and requires assistance for transfers, bed mobility and ambulation.  -  PTOT consultation.  Dysphagia -  Appreciate SLP assistance -  MBS once off bipap -  RN to let MD know once off bipap consistently and patient ready to advance diet  Anxiety,  -  Hold bupropion as this may decrease sz threshold -  Consider addition of ativan prn  Chronic pain -  Continue prn morphine and restart oral medications once breathing improved  Leukocytosis, likely due to PNA and malignancy  -  Repeat CBC  Normocytic anemia likely chronic disease and chemo -  Repeat hgb -  Transfuse for hgb < 7  Thrombocytopenia, possibly due to ongoing infection, HIT  due to Lovenox, marrow suppression due to cancer and chemo. With normal INR and APTT DIC is less likely.  - Lovenox stopped due to [place] and low platelets.   NSTEMI, likely secondary to strain from recent stroke -  Not candidate for cath or a/c due to recent ICH -  ECHO:  EF 55-60% no wall motion abl, grade 1 DD, mod MR, normal estimated CVP  Diet:  NPO pending off bipap Access:  PIV IVF:  yes Proph:  scds  Code Status: full Family Communication: spoke with patient and husband Disposition Plan: pending improvement in breathing and [place] conversation     Consultants:  Oncology:  Dr. Julien Nordmann  Palliative care  NSGY by phone on day 1  Procedures:  CT head  CXR  Antibiotics:  vanc 3/24 >>  Cefepime 3/24 > 3/25  Zosyn 3/25 >>  HPI/Subjective:  Denies discomfort/pain.  Still on bipap this morning  Objective: Filed Vitals:   06/01/13 0409 06/01/13 0500 06/01/13 0600 06/01/13 0800  BP: 155/115  137/81 134/87  Pulse: 94  98 97  Temp:      TempSrc:      Resp: 20  25 23   Height:      Weight:  92.1 kg (203 lb 0.7 oz)    SpO2: 98%  99% 100%    Intake/Output Summary (Last 24 hours) at 06/01/13 0839 Last data filed at 06/01/13 0800  Gross per 24 hour  Intake   1500 ml  Output   1130 ml  Net    370  ml   Filed Weights   05/31/13 0534 06/01/13 0500  Weight: 88.905 kg (196 lb) 92.1 kg (203 lb 0.7 oz)    Exam:   General:  AAF, No acute distress  HEENT:  NCAT, MMM  Cardiovascular:  RRR, nl S1, S2 no mrg, 2+ pulses, warm extremities  Respiratory:  Bronchial BS right side, left clear, no focal rales or rhonchi, no increased WOB  Abdomen:   NABS, soft, NT/ND  MSK:   Normal tone and bulk, no LEE  Neuro:  Slurred speech, 4/5 strength LLE, otherwise 4+/5 intact strength.  No obvious neglect.  SITLT  Data Reviewed: Basic Metabolic Panel:  Recent Labs Lab 05/31/13 0111  NA 133*  K 4.5  CL 96  CO2 24  GLUCOSE 89  BUN 15  CREATININE 0.95  CALCIUM 12.0*    Liver Function Tests:  Recent Labs Lab 05/31/13 0111  AST 36  ALT 36*  ALKPHOS 170*  BILITOT 0.7  PROT 5.9*  ALBUMIN 2.1*   No results found for this basename: LIPASE, AMYLASE,  in the last 168 hours No results found for this basename: AMMONIA,  in the last 168 hours CBC:  Recent Labs Lab 05/31/13 0111  WBC 25.6*  NEUTROABS 22.2*  HGB 10.3*  HCT 33.2*  MCV 97.6  PLT 60*   Cardiac Enzymes:  Recent Labs Lab 05/31/13 0520  TROPONINI 1.19*   BNP (last 3 results)  Recent Labs  03/07/13 1210  PROBNP 933.4*   CBG: No results found for this basename: GLUCAP,  in the last 168 hours  Recent Results (from the past 240 hour(s))  MRSA PCR SCREENING     Status: None   Collection Time    05/17/2013 11:39 PM      Result Value Ref Range Status   MRSA by PCR NEGATIVE  NEGATIVE Final   Comment:            The GeneXpert MRSA Assay (FDA     approved for NASAL specimens     only), is one component of a     comprehensive MRSA colonization     surveillance program. It is not     intended to diagnose MRSA     infection nor to guide or     monitor treatment for     MRSA infections.  CULTURE, BLOOD (ROUTINE X 2)     Status: None   Collection Time    05/31/13  5:15 AM      Result Value Ref Range Status   Specimen Description BLOOD RIGHT ANTECUBITAL   Final   Special Requests BOTTLES DRAWN AEROBIC ONLY 3ML   Final   Culture  Setup Time     Final   Value: 05/31/2013 08:19     Performed at [facility]   Culture     Final   Value:        BLOOD CULTURE RECEIVED NO GROWTH TO DATE CULTURE WILL BE HELD FOR 5 DAYS BEFORE ISSUING A FINAL NEGATIVE REPORT     Performed at [facility]   Report Status PENDING   Incomplete  CULTURE, BLOOD (ROUTINE X 2)     Status: None   Collection Time    05/31/13  5:20 AM      Result Value Ref Range Status   Specimen Description BLOOD LEFT HAND   Final   Special Requests BOTTLES DRAWN AEROBIC ONLY 2ML   Final   Culture  Setup  Time  Final   Value: 05/31/2013 08:19     Performed at [facility]   Culture     Final   Value:        BLOOD CULTURE RECEIVED NO GROWTH TO DATE CULTURE WILL BE HELD FOR 5 DAYS BEFORE ISSUING A FINAL NEGATIVE REPORT     Performed at [facility]   Report Status PENDING   Incomplete     Studies: Ct Head Wo Contrast  05/31/2013   CLINICAL DATA:  Hemorrhagic infarction conversion. History of non-small cell lung cancer.  EXAM: CT HEAD WITHOUT CONTRAST  TECHNIQUE: Contiguous axial images were obtained from the base of the skull through the vertex without intravenous contrast.  COMPARISON:  06/02/2013 head CT from [facility]  FINDINGS: 7 mm hemorrhagic lesion in the medial left frontal lobe with small amount of surrounding edema is unchanged. 7 mm hyperdense focus in the right occipital lobe is also unchanged without evidence of significant surrounding edema. Edema in the right temporal lobe associated with the right temporal bone lesion is grossly unchanged but not as well evaluated due to artifact. There is no evidence of acute large territory cortical infarct. There is no to midline shift or extra-axial fluid collection. Cerebral volume is within normal limits for age. The mastoid air cells and visualized paranasal sinuses are clear. Orbits are unremarkable.  IMPRESSION: Unchanged appearance of small foci of acute hemorrhage in the left frontal lobe and right occipital lobe. No midline shift or evidence of new intracranial hemorrhage.   Electronically Signed   By: Logan Bores   On: 05/31/2013 02:10   Dg Chest Port 1 View  05/31/2013   CLINICAL DATA:  Right hilar mass compressing main stem bronchus, possible pneumonia.  EXAM: PORTABLE CHEST - 1 VIEW  COMPARISON:  DG CHEST 2 VIEW dated 05/16/2013; CT ANGIO CHEST W/CM &/OR WO/CM dated 03/07/2013  FINDINGS: Diffuse interstitial prominence with superimposed patchy alveolar airspace opacities. Partial right upper lobe collapse.  Elevated right hemidiaphragm. Possible small right pleural effusion. No pneumothorax. Multiple EKG lines overlie the patient and may obscure subtle underlying pathology. Soft tissue planes and included osseous structures are nonsuspicious. Interval removal of right PICC.  IMPRESSION: Diffuse interstitial and to lesser extent alveolar airspace opacities could reflect pulmonary edema or possibly pneumonia with new partial right upper lobe collapse.   Electronically Signed   By: Elon Alas   On: 05/31/2013 05:09    Scheduled Meds: . budesonide-formoterol  2 puff Inhalation BID  . ceFEPime (MAXIPIME) IV  2 g Intravenous Q12H  . dexamethasone  4 mg Intravenous Q6H  . fluticasone  2 spray Each Nare Daily  . ipratropium-albuterol  3 mL Nebulization Q6H  . sodium chloride  3 mL Intravenous Q12H  . vancomycin  1,000 mg Intravenous Q12H   Continuous Infusions: . sodium chloride 100 mL/hr at 06/01/13 0830    Principal Problem:   Metastatic cancer to brain Active Problems:   DEPRESSION   HYPERTENSION   GERD   Acute respiratory failure   Non-small cell cancer of right lung   DVT (deep venous thrombosis)   Hip fracture, left   Cerebral embolism with cerebral infarction   Slurred speech   HCAP (healthcare-associated pneumonia)   ICH (intracerebral hemorrhage)    Time spent: 30 min    Camila [place], [facility] Pager [phone]. If 7PM-7AM, please contact night-coverage at [email or web address], password [facility] 06/01/2013, 8:39 AM  LOS: 2 days

## 2013-06-01 NOTE — Progress Notes (Signed)
RT placed PT back on BiPAP 10/5/ at 45%- RN aware.

## 2013-06-01 NOTE — Progress Notes (Signed)
INITIAL NUTRITION ASSESSMENT  DOCUMENTATION CODES Per approved criteria  -Obesity Unspecified   INTERVENTION: - Diet advancement per SLP/MD - Nutrition per goals of care/pt wishes - Will continue to monitor   NUTRITION DIAGNOSIS: Inadequate oral intake related to inability to eat as evidenced by NPO.   Goal: Advance diet as tolerated to diet per SLP  Monitor:  Weights, labs, diet advancement, goals of care  Reason for Assessment: Malnutrition screening tool   63 y.o. female  Admitting Dx: Metastatic cancer to brain  ASSESSMENT: Pt with history of stage III non-small cell lung cancer with metastasis to bone s/p 1 course of chemotherapy and radiation, GERD, recent CVA due to left vertebral stenosis, extensive right upper extremity DVT and SVT. Yesterday while she was eating she started having difficulty chewing and then she had drooling from her right side of the mouth. This was followed by difficulty with her speech. They called her primary doctor recommended to come to the ED and the patient was taken to the [facility]. Patient was taken there as a code stroke, emergent CT scan was showing possible hemorrhage which was followed by an MRI which confirmed the finding with metastasis and the patient was sent here for work further workup. Son is primary caregiver. Per MD notes, extensive workup in the [facility] was suggestive of new intracranial hemorrhage in the left parasagital area without any midline shift, with vasogenic edema, associated with other areas of bleeding in the right occipital lobe, right temporal lobe, right frontal lobe. On-call radiologist confirmed that she has possibility 4 metastatic lesions in her brain with hemorrhage in some. From SNF.   Met with pt and son who report pt was typically eating between 2-3 meals/day and her appetite was improving due to being on steroids, however pt admits to eating "not good" recently r/t dysphagia. Has  been on Ensure in the past but doesn't like it. Weight up 7 pounds this month. Son reports pt on a heart healthy diet PTA. SLP following, had beside swallow evaluation yesterday who noted pt with moderate aspiration risk with recommendations for NPO or starting puree/nectar thick diet with very strict precautions. Noted plan for palliative care consult.   ALT elevated   Height: Ht Readings from Last 1 Encounters:  05/31/13 5' 3"  (1.6 m)    Weight: Wt Readings from Last 1 Encounters:  06/01/13 203 lb 0.7 oz (92.1 kg)    Ideal Body Weight: 115 lbs  % Ideal Body Weight: 176%  Wt Readings from Last 10 Encounters:  06/01/13 203 lb 0.7 oz (92.1 kg)  05/16/13 196 lb 10.4 oz (89.2 kg)  05/16/13 196 lb 10.4 oz (89.2 kg)  04/19/13 206 lb 1.6 oz (93.486 kg)  04/18/13 203 lb 3.2 oz (92.171 kg)  04/12/13 207 lb 8 oz (94.121 kg)  04/08/13 208 lb (94.348 kg)  04/06/13 211 lb (95.709 kg)  04/05/13 208 lb 4.8 oz (94.484 kg)  04/04/13 206 lb 1.6 oz (93.486 kg)    Usual Body Weight: 196 lbs earlier this month  % Usual Body Weight: 103%  BMI:  Body mass index is 35.98 kg/(m^2). Class II obesity  Estimated Nutritional Needs: Kcal: 1600-1800 Protein: 65-80g Fluid: 1.6-1.8L/day  Skin: Non-pitting RUE edema  Diet Order: NPO  EDUCATION NEEDS: -No education needs identified at this time   Intake/Output Summary (Last 24 hours) at 06/01/13 1032 Last data filed at 06/01/13 0800  Gross per 24 hour  Intake   1300 ml  Output  1280 ml  Net     20 ml    Last BM: 3/16 per pt  Labs:   Recent Labs Lab 05/31/13 0111  NA 133*  K 4.5  CL 96  CO2 24  BUN 15  CREATININE 0.95  CALCIUM 12.0*  GLUCOSE 89    CBG (last 3)  No results found for this basename: GLUCAP,  in the last 72 hours  Scheduled Meds: . budesonide-formoterol  2 puff Inhalation BID  . dexamethasone  4 mg Intravenous Q6H  . fluticasone  2 spray Each Nare Daily  . ipratropium-albuterol  3 mL Nebulization Q6H  .  piperacillin-tazobactam (ZOSYN)  IV  3.375 g Intravenous 3 times per day  . sodium chloride  3 mL Intravenous Q12H  . vancomycin  1,000 mg Intravenous Q12H    Continuous Infusions: . sodium chloride 100 mL/hr at 06/01/13 0830    Past Medical History  Diagnosis Date  . Anxiety   . Depression   . Peripheral neuropathy     feet: unknown etiol  . Elevated glucose   . Allergic rhinitis   . GERD (gastroesophageal reflux disease)   . Complication of anesthesia w/tubal    got anest. "went crazy"   . Non-small cell lung cancer     Past Surgical History  Procedure Laterality Date  . Abdominal hysterectomy      partial  . Tubal reversal    . Tubal ligation      x2  . Fibroid tumors    . Knee arthroscopy    . Foot surgery      cysts from both feet  . Video bronchoscopy Bilateral 02/21/2013    Procedure: VIDEO BRONCHOSCOPY WITHOUT FLUORO;  Surgeon: Doree Fudge, MD;  Location: [facility];  Service: Cardiopulmonary;  Laterality: Bilateral;  . Tee without cardioversion N/A 05/23/2013    Procedure: TRANSESOPHAGEAL ECHOCARDIOGRAM (TEE);  Surgeon: Pixie Casino, MD;  Location: [facility];  Service: Cardiovascular;  Laterality: N/A;    Mikey College MS, [place], [place] Pager [phone] After Hours Pager

## 2013-06-01 NOTE — Progress Notes (Signed)
Full note to follow:  I met today with [name]'s son and daughter, Meagan Garcia and Meagan Garcia, who are torn over decisions for their mother. Meagan Garcia is leaning more towards a comfort approach and is wanting to go look at [facility]. While Meagan Garcia is struggling with feeling like she is giving up on her mother and admits that her brain cannot process beyond believing that her mother will get stronger and be able to receive further treatment for her cancer. They are unable to make any decisions at this time but are continuing to contemplate the options. She will remain full code for now.   Vinie Sill, NP Palliative Medicine Team Pager # [phone] (M-F 8a-5p) Team Phone # [phone] (Nights/Weekends)

## 2013-06-01 NOTE — Progress Notes (Addendum)
SLP Cancellation Note  Patient Details Name: Meagan Garcia MRN: [id number] DOB: [birth year]   Cancelled treatment:       Reason Eval/Treat Not Completed: Other (comment) (await order for MBS or initiation of modified diet - defer to MD- note palliative care meeting pending and pt now back on Bipap.)   Luanna Salk, [facility] SLP [phone]

## 2013-06-01 NOTE — Progress Notes (Signed)
PT placed back on BiPAP at approx 1103- RN aware Hermina Staggers).

## 2013-06-01 NOTE — Progress Notes (Signed)
RT removed PT from BIPAP at approx 0850 and placed on 50% VM at 12 lpm 02. Current Sp02 95, HR 99, RR 19, BS exp whz (upper airway post neb tx). RN aware of this process.

## 2013-06-01 NOTE — Progress Notes (Signed)
ANTIBIOTIC CONSULT NOTE - FOLLOW UP  Pharmacy Consult for vancomycin, cefepime --> zosyn Indication: Post-obstructive PNA  No Known Allergies  Patient Measurements: Height: 5\' 3"  (160 cm) Weight: 203 lb 0.7 oz (92.1 kg) IBW/kg (Calculated) : 52.4 Adjusted Body Weight:   Vital Signs: Temp: 97.5 F (36.4 C) (03/25 0800) Temp src: Oral (03/25 0800) BP: 134/87 mmHg (03/25 0800) Pulse Rate: 97 (03/25 0800) Intake/Output from previous day: 03/24 0701 - 03/25 0700 In: 1600 [I.V.:1300; IV Piggyback:300] Out: 1130 [Urine:1130] Intake/Output from this shift: Total I/O In: 100 [I.V.:100] Out: 150 [Urine:150]  Labs:  Recent Labs  05/31/13 0111  WBC 25.6*  HGB 10.3*  PLT 60*  CREATININE 0.95   Estimated Creatinine Clearance: 67.1 ml/min (by C-G formula based on Cr of 0.95). No results found for this basename: VANCOTROUGH, Corlis Leak, VANCORANDOM, [place], GENTPEAK, GENTRANDOM, TOBRATROUGH, TOBRAPEAK, TOBRARND, AMIKACINPEAK, AMIKACINTROU, AMIKACIN,  in the last 72 hours   Microbiology: Recent Results (from the past 720 hour(s))  MRSA PCR SCREENING     Status: None   Collection Time    05/16/2013 11:39 PM      Result Value Ref Range Status   MRSA by PCR NEGATIVE  NEGATIVE Final   Comment:            The GeneXpert MRSA Assay (FDA     approved for NASAL specimens     only), is one component of a     comprehensive MRSA colonization     surveillance program. It is not     intended to diagnose MRSA     infection nor to guide or     monitor treatment for     MRSA infections.  CULTURE, BLOOD (ROUTINE X 2)     Status: None   Collection Time    05/31/13  5:15 AM      Result Value Ref Range Status   Specimen Description BLOOD RIGHT ANTECUBITAL   Final   Special Requests BOTTLES DRAWN AEROBIC ONLY 3ML   Final   Culture  Setup Time     Final   Value: 05/31/2013 08:19     Performed at [facility]   Culture     Final   Value:        BLOOD CULTURE RECEIVED NO GROWTH TO  DATE CULTURE WILL BE HELD FOR 5 DAYS BEFORE ISSUING A FINAL NEGATIVE REPORT     Performed at [facility]   Report Status PENDING   Incomplete  CULTURE, BLOOD (ROUTINE X 2)     Status: None   Collection Time    05/31/13  5:20 AM      Result Value Ref Range Status   Specimen Description BLOOD LEFT HAND   Final   Special Requests BOTTLES DRAWN AEROBIC ONLY 2ML   Final   Culture  Setup Time     Final   Value: 05/31/2013 08:19     Performed at [facility]   Culture     Final   Value:        BLOOD CULTURE RECEIVED NO GROWTH TO DATE CULTURE WILL BE HELD FOR 5 DAYS BEFORE ISSUING A FINAL NEGATIVE REPORT     Performed at [facility]   Report Status PENDING   Incomplete    Anti-infectives   Start     Dose/Rate Route Frequency Ordered Stop   05/31/13 1200  vancomycin (VANCOCIN) IVPB 1000 mg/200 mL premix     1,000 mg 200 mL/hr over 60 Minutes Intravenous  Every 12 hours 05/31/13 0536     05/31/13 1000  ceFEPIme (MAXIPIME) 2 g in dextrose 5 % 50 mL IVPB  Status:  Discontinued     2 g 100 mL/hr over 30 Minutes Intravenous Every 12 hours 05/31/13 0536 06/01/13 0856   05/31/13 0230  vancomycin (VANCOCIN) IVPB 1000 mg/200 mL premix     1,000 mg 200 mL/hr over 60 Minutes Intravenous  Once 05/31/13 0215 05/31/13 0430   05/31/13 0230  ceFEPIme (MAXIPIME) 2 g in dextrose 5 % 50 mL IVPB     2 g 100 mL/hr over 30 Minutes Intravenous  Once 05/31/13 0215 05/31/13 0400      Assessment: 75 YOF presents with slurred speech found to have ICH. 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 Broad spectrum abx for HCAP/post-obstructive PNA.  05/31/2013 >> vanc >> 05/31/2013 >> cefepime>> 3/253 3/25 >> zosyn >>  Tmax: afeb WBCs: no labs 25.6 (trending up) Renal: SCr = no labs 0.95 for CrCl = 59ml/min (C-G) and 67ml/min (N)  3/24 blood: NGTD   Goal of Therapy:  Vancomycin trough level 15-20  mcg/ml  Plan:   Continue vancomycin 1gm IV q12h with plans to check VT tom  Start zosyn 3.375gm IV q8h with 4h infusion  Doreene Eland, PharmD, BCPS.   Pager: [phone]  06/01/2013,9:07 AM

## 2013-06-01 NOTE — Progress Notes (Signed)
Patient VT:[id number] L [place]insin      DOB: 1950-07-11      SCB:[id number] Consult for goc received will be seen as soon as soon as possible. We apologize for the wait. If assistance with symptom management needed please call [phone].   Isidore Margraf L. Lovena Le, MD MBA The Palliative Medicine Team at [facility] Phone: [phone] Pager: [phone]

## 2013-06-02 ENCOUNTER — Ambulatory Visit: Payer: BC Managed Care – PPO | Admitting: Radiation Oncology

## 2013-06-02 ENCOUNTER — Inpatient Hospital Stay (HOSPITAL_COMMUNITY): Payer: BC Managed Care – PPO

## 2013-06-02 ENCOUNTER — Encounter (HOSPITAL_COMMUNITY): Payer: BC Managed Care – PPO

## 2013-06-02 DIAGNOSIS — Z515 Encounter for palliative care: Secondary | ICD-10-CM

## 2013-06-02 DIAGNOSIS — R0603 Acute respiratory distress: Secondary | ICD-10-CM

## 2013-06-02 LAB — PREPARE PLATELET PHERESIS
UNIT DIVISION: 0
UNIT DIVISION: 0

## 2013-06-02 LAB — BASIC METABOLIC PANEL
BUN: 26 mg/dL — AB (ref 6–23)
CALCIUM: 12.1 mg/dL — AB (ref 8.4–10.5)
CHLORIDE: 100 meq/L (ref 96–112)
CO2: 20 meq/L (ref 19–32)
CREATININE: 0.88 mg/dL (ref 0.50–1.10)
GFR calc Af Amer: 81 mL/min — ABNORMAL LOW (ref >90)
GFR calc non Af Amer: 69 mL/min — ABNORMAL LOW (ref >90)
Glucose, Bld: 177 mg/dL — ABNORMAL HIGH (ref 70–99)
Potassium: 4.3 mEq/L (ref 3.7–5.3)
Sodium: 135 mEq/L — ABNORMAL LOW (ref 137–147)

## 2013-06-02 LAB — HEPARIN INDUCED THROMBOCYTOPENIA PNL
HEPARIN INDUCED PLT AB: NEGATIVE
Patient O.D.: 0.066
UFH HIGH DOSE UFH H: 0 %
UFH LOW DOSE 0.5 IU/ML: 0 %
UFH Low Dose 0.1 IU/mL: 0 % Release
UFH SRA Result: NEGATIVE

## 2013-06-02 LAB — CBC
HCT: 30.3 % — ABNORMAL LOW (ref 36.0–46.0)
Hemoglobin: 9.3 g/dL — ABNORMAL LOW (ref 12.0–15.0)
MCH: 30 pg (ref 26.0–34.0)
MCHC: 30.7 g/dL (ref 30.0–36.0)
MCV: 97.7 fL (ref 78.0–100.0)
Platelets: 87 10*3/uL — ABNORMAL LOW (ref 150–400)
RBC: 3.1 MIL/uL — ABNORMAL LOW (ref 3.87–5.11)
RDW: 19.1 % — AB (ref 11.5–15.5)
WBC: 30.2 10*3/uL — ABNORMAL HIGH (ref 4.0–10.5)

## 2013-06-02 LAB — VANCOMYCIN, TROUGH: Vancomycin Tr: 30.2 ug/mL (ref 10.0–20.0)

## 2013-06-02 MED ORDER — LORAZEPAM 2 MG/ML IJ SOLN: 1.0000 mg | INTRAMUSCULAR | Status: DC | PRN

## 2013-06-02 MED ORDER — VANCOMYCIN HCL 10 G IV SOLR
1500.0000 mg | INTRAVENOUS | Status: DC
  Filled 2013-06-02: qty 1500

## 2013-06-02 MED ORDER — HALOPERIDOL LACTATE 5 MG/ML IJ SOLN
1.0000 mg | Freq: Four times a day (QID) | INTRAMUSCULAR | Status: DC | PRN
  Administered 2013-06-02: 1 mg via INTRAVENOUS
  Filled 2013-06-02: qty 1

## 2013-06-02 MED ORDER — LORAZEPAM 2 MG/ML PO CONC: 1.0000 mg | ORAL | Status: DC | PRN

## 2013-06-02 MED ORDER — FUROSEMIDE 10 MG/ML IJ SOLN: 40.0000 mg | Freq: Every day | INTRAMUSCULAR | Status: DC

## 2013-06-02 MED ORDER — SODIUM CHLORIDE 0.9 % IV SOLN
2.0000 mg/h | INTRAVENOUS | Status: DC
  Administered 2013-06-02: 2 mg/h via SUBCUTANEOUS
  Filled 2013-06-02: qty 10

## 2013-06-02 MED ORDER — HALOPERIDOL LACTATE 5 MG/ML IJ SOLN: 2.0000 mg | Freq: Four times a day (QID) | INTRAMUSCULAR | Status: DC | PRN

## 2013-06-02 NOTE — Progress Notes (Signed)
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   -  Repeat CT head with stable hemorrhage:  left parasagital area without any midline shift, with vasogenic edema, associated with other areas of bleeding in the right occipital lobe, right temporal lobe, right frontal lobe -  Continue dexamethasone -  She has been through one course of chemotherapy and radiation with progression -  serial neuro checks -  Continue seizure precautions -  Appreciate PCT and oncology assistance.  Family struggling to agree on [place].   -  Vital sign instability may be related to recent strokes  Acute respiratory failure with hypoxia likely secondary to progression of her right-sided hilar mass with obstruction of the right mainstem bronchus and possible postobstructive pneumonia.  Patient bipap dependent at this time.   -  BiPAP as needed  -  BD scheduled and prn -  Continue symbicort -  Blood cultures NGTD -  Continue IV vancomycin day 3 -  continue zosyn for post-obstructive PNA -  Spoke with family about possible need for decision regarding tracheostomy/chronic vent vs. Trial off bipap and transition to face mask.  Family states patient would not want to be placed on chronic ventilation, but still struggling with DNR.  Conversation ongoing.  I think she may succumb to in-hospital death once bipap removed.   -  CXR:  If still evidence of pulmonary edema, trial of lasix  DVT RUE without significant edema, a/c dc'd due to [place]   Left-sided hip fracture with limited mobility and requires assistance for transfers, bed mobility and ambulation.  -  PTOT consultation.  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 With normal INR and APTT DIC is less likely.  - Lovenox stopped due to [place] and low platelets.  - transfused 2 plt on 3/25 with good response  NSTEMI, likely secondary to strain from recent stroke -  Not candidate for cath or a/c due to recent ICH -  ECHO:  EF 55-60% no wall motion abl, grade 1 DD, mod MR, normal estimated CVP  Moderate to severe protein calorie malnutrition secondary to NPO for bipap -  Awaiting GOC decisions by family  Diet:  NPO pending off bipap Access:  PIV IVF:  yes Proph:  scds  Code Status: full Family Communication: spoke with patient and husband Disposition Plan: pending improvement in breathing and [place] conversation     Consultants:  Oncology:  Dr. Julien Nordmann  Palliative care  NSGY by phone on day 1  Procedures:  CT head  CXR  Antibiotics:  vanc 3/24 >>  Cefepime 3/24 > 3/25  Zosyn 3/25 >>  HPI/Subjective:  Patient lethargic this morning.    Objective: Filed Vitals:  06/01/13 2357 06/02/13 0025 06/02/13 0400 06/02/13 0412  BP: 108/77 139/80 120/61   Pulse: 106 107 115 120  Temp:  98.2 F (36.8 C)  96.4 F (35.8 C)  TempSrc:  Axillary  Axillary  Resp: 23 25 27  33  Height:      Weight:    98.6 kg (217 lb 6 oz)  SpO2: 95% 95% 95% 93%    Intake/Output Summary (Last 24 hours) at 06/02/13 0755 Last data filed at 06/02/13 0700  Gross per 24 hour  Intake 2555.5 ml  Output   1100 ml  Net 1455.5 ml   Filed Weights   05/31/13 0534 06/01/13 0500 06/02/13 0412   Weight: 88.905 kg (196 lb) 92.1 kg (203 lb 0.7 oz) 98.6 kg (217 lb 6 oz)    Exam:   General:  AAF, moderate respiratory distress with SCM, intercostal/subcostal RTX even on bipap  HEENT:  NCAT, MMM  Cardiovascular:  Tachycardic RR, nl S1, S2 no mrg, 2+ pulses, warm extremities  Respiratory:  Bronchial BS right side, full expiratory wheeze bilaterally, no increased WOB  Abdomen:   NABS, soft, NT/ND  MSK:   Normal tone and bulk, no LEE  Neuro:  Grossly moved all extremities  Psych:  Asleep and opens eyes to sternal rub, but does not speak/vocalize  Data Reviewed: Basic Metabolic Panel:  Recent Labs Lab 05/31/13 0111 06/01/13 1030 06/02/13 0337  NA 133* 136* 135*  K 4.5 4.3 4.3  CL 96 100 100  CO2 24 22 20   GLUCOSE 89 122* 177*  BUN 15 23 26*  CREATININE 0.95 0.93 0.88  CALCIUM 12.0* 12.0* 12.1*   Liver Function Tests:  Recent Labs Lab 05/31/13 0111  AST 36  ALT 36*  ALKPHOS 170*  BILITOT 0.7  PROT 5.9*  ALBUMIN 2.1*   No results found for this basename: LIPASE, AMYLASE,  in the last 168 hours No results found for this basename: AMMONIA,  in the last 168 hours CBC:  Recent Labs Lab 05/31/13 0111 06/01/13 1030 06/02/13 0337  WBC 25.6* 30.0* 30.2*  NEUTROABS 22.2*  --   --   HGB 10.3* 9.8* 9.3*  HCT 33.2* 31.0* 30.3*  MCV 97.6 95.7 97.7  PLT 60* 29* 87*   Cardiac Enzymes:  Recent Labs Lab 05/31/13 0520  TROPONINI 1.19*   BNP (last 3 results)  Recent Labs  03/07/13 1210  PROBNP 933.4*   CBG: No results found for this basename: GLUCAP,  in the last 168 hours  Recent Results (from the past 240 hour(s))  MRSA PCR SCREENING     Status: None   Collection Time    05/26/2013 11:39 PM      Result Value Ref Range Status   MRSA by PCR NEGATIVE  NEGATIVE Final   Comment:            The GeneXpert MRSA Assay (FDA     approved for NASAL specimens     only), is one component of a     comprehensive MRSA colonization     surveillance program. It  is not     intended to diagnose MRSA     infection nor to guide or     monitor treatment for     MRSA infections.  CULTURE, BLOOD (ROUTINE X 2)     Status: None   Collection Time    05/31/13  5:15 AM      Result Value Ref Range Status   Specimen Description BLOOD RIGHT ANTECUBITAL  Final   Special Requests BOTTLES DRAWN AEROBIC ONLY 3ML   Final   Culture  Setup Time     Final   Value: 05/31/2013 08:19     Performed at [facility]   Culture     Final   Value:        BLOOD CULTURE RECEIVED NO GROWTH TO DATE CULTURE WILL BE HELD FOR 5 DAYS BEFORE ISSUING A FINAL NEGATIVE REPORT     Performed at [facility]   Report Status PENDING   Incomplete  CULTURE, BLOOD (ROUTINE X 2)     Status: None   Collection Time    05/31/13  5:20 AM      Result Value Ref Range Status   Specimen Description BLOOD LEFT HAND   Final   Special Requests BOTTLES DRAWN AEROBIC ONLY 2ML   Final   Culture  Setup Time     Final   Value: 05/31/2013 08:19     Performed at [facility]   Culture     Final   Value:        BLOOD CULTURE RECEIVED NO GROWTH TO DATE CULTURE WILL BE HELD FOR 5 DAYS BEFORE ISSUING A FINAL NEGATIVE REPORT     Performed at [facility]   Report Status PENDING   Incomplete     Studies: No results found.  Scheduled Meds: . budesonide-formoterol  2 puff Inhalation BID  . dexamethasone  4 mg Intravenous Q6H  . fluticasone  2 spray Each Nare Daily  . ipratropium-albuterol  3 mL Nebulization Q6H  . piperacillin-tazobactam (ZOSYN)  IV  3.375 g Intravenous 3 times per day  . sodium chloride  3 mL Intravenous Q12H  . vancomycin  1,000 mg Intravenous Q12H   Continuous Infusions: . sodium chloride 100 mL/hr at 06/02/13 0029    Principal Problem:   Metastatic cancer to brain Active Problems:   DEPRESSION   HYPERTENSION   GERD   Acute respiratory failure   Non-small cell cancer of right lung   DVT (deep venous thrombosis)   Hip fracture, left    Cerebral embolism with cerebral infarction   Slurred speech   HCAP (healthcare-associated pneumonia)   ICH (intracerebral hemorrhage)    Time spent: 30 min    Dezmond Downie, [facility] Pager [phone]. If 7PM-7AM, please contact night-coverage at [email or web address], password [facility] 06/02/2013, 7:55 AM  LOS: 3 days

## 2013-06-02 NOTE — Progress Notes (Signed)
SLP Cancellation Note  Patient Details Name: Meagan Garcia MRN: [id number] DOB: 1950-05-14   Cancelled treatment:       Reason Eval/Treat Not Completed: Medical issues which prohibited therapy.  Patient on Bipap. Spoke with MD who reported that patient will remain NPO until palliative care meeting for decision regarding POC. Will f/u.  [place], CCC-SLP [phone]    Gabriel Rainwater Meryl 06/02/2013, 8:41 AM

## 2013-06-02 NOTE — Progress Notes (Signed)
ANTIBIOTIC CONSULT NOTE - FOLLOW UP  Pharmacy Consult for vancomycin, cefepime --> zosyn Indication: Post-obstructive PNA  No Known Allergies  Patient Measurements: Height: 5\' 3"  (160 cm) Weight: 217 lb 6 oz (98.6 kg) IBW/kg (Calculated) : 52.4 Adjusted Body Weight:   Vital Signs: Temp: 97.7 F (36.5 C) (03/26 0800) Temp src: Axillary (03/26 0800) BP: 133/66 mmHg (03/26 0809) Pulse Rate: 104 (03/26 0809) Intake/Output from previous day: 03/25 0701 - 03/26 0700 In: 2555.5 [I.V.:1600; Blood:405.5; IV Piggyback:550] Out: 1100 [Urine:1100] Intake/Output from this shift:    Labs:  Recent Labs  05/31/13 0111 06/01/13 1030 06/02/13 0337  WBC 25.6* 30.0* 30.2*  HGB 10.3* 9.8* 9.3*  PLT 60* 29* 87*  CREATININE 0.95 0.93 0.88   Estimated Creatinine Clearance: 75.1 ml/min (by C-G formula based on Cr of 0.88). No results found for this basename: VANCOTROUGH, Corlis Leak, VANCORANDOM, [place], GENTPEAK, GENTRANDOM, TOBRATROUGH, TOBRAPEAK, TOBRARND, AMIKACINPEAK, AMIKACINTROU, AMIKACIN,  in the last 72 hours   Microbiology: Recent Results (from the past 720 hour(s))  MRSA PCR SCREENING     Status: None   Collection Time    05/20/2013 11:39 PM      Result Value Ref Range Status   MRSA by PCR NEGATIVE  NEGATIVE Final   Comment:            The GeneXpert MRSA Assay (FDA     approved for NASAL specimens     only), is one component of a     comprehensive MRSA colonization     surveillance program. It is not     intended to diagnose MRSA     infection nor to guide or     monitor treatment for     MRSA infections.  CULTURE, BLOOD (ROUTINE X 2)     Status: None   Collection Time    05/31/13  5:15 AM      Result Value Ref Range Status   Specimen Description BLOOD RIGHT ANTECUBITAL   Final   Special Requests BOTTLES DRAWN AEROBIC ONLY 3ML   Final   Culture  Setup Time     Final   Value: 05/31/2013 08:19     Performed at [facility]   Culture     Final   Value:         BLOOD CULTURE RECEIVED NO GROWTH TO DATE CULTURE WILL BE HELD FOR 5 DAYS BEFORE ISSUING A FINAL NEGATIVE REPORT     Performed at [facility]   Report Status PENDING   Incomplete  CULTURE, BLOOD (ROUTINE X 2)     Status: None   Collection Time    05/31/13  5:20 AM      Result Value Ref Range Status   Specimen Description BLOOD LEFT HAND   Final   Special Requests BOTTLES DRAWN AEROBIC ONLY 2ML   Final   Culture  Setup Time     Final   Value: 05/31/2013 08:19     Performed at [facility]   Culture     Final   Value:        BLOOD CULTURE RECEIVED NO GROWTH TO DATE CULTURE WILL BE HELD FOR 5 DAYS BEFORE ISSUING A FINAL NEGATIVE REPORT     Performed at [facility]   Report Status PENDING   Incomplete    Anti-infectives   Start     Dose/Rate Route Frequency Ordered Stop   06/01/13 1000  piperacillin-tazobactam (ZOSYN) IVPB 3.375 g     3.375 g 12.5  mL/hr over 240 Minutes Intravenous 3 times per day 06/01/13 0910     05/31/13 1200  vancomycin (VANCOCIN) IVPB 1000 mg/200 mL premix     1,000 mg 200 mL/hr over 60 Minutes Intravenous Every 12 hours 05/31/13 0536     05/31/13 1000  ceFEPIme (MAXIPIME) 2 g in dextrose 5 % 50 mL IVPB  Status:  Discontinued     2 g 100 mL/hr over 30 Minutes Intravenous Every 12 hours 05/31/13 0536 06/01/13 0856   05/31/13 0230  vancomycin (VANCOCIN) IVPB 1000 mg/200 mL premix     1,000 mg 200 mL/hr over 60 Minutes Intravenous  Once 05/31/13 0215 05/31/13 0430   05/31/13 0230  ceFEPIme (MAXIPIME) 2 g in dextrose 5 % 50 mL IVPB     2 g 100 mL/hr over 30 Minutes Intravenous  Once 05/31/13 0215 05/31/13 0400      Assessment: HPI: 78 YOF presents with slurred speech found to have ICH. 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 Broad spectrum abx for HCAP.  05/31/2013 >> vanc >> 05/31/2013 >> cefepime>> 3/253 3/25 >> zosyn >>  Tmax: afeb WBCs: no labs 25.6  (trending up) Renal: SCr = no labs 0.95 for CrCl = 15ml/min (C-G) and 25ml/min (N)  3/24 blood: NGTD  Dose changes/drug level info:  3/26 VT 1130 =  30.2 mcg/ml on 1gm IV q12h (prior to 6th dose)  3/24 blood: NGTD   Goal of Therapy:  Vancomycin trough level 15-20 mcg/ml  Plan:  D#3 vanco/D#2 zosyn  Vancomycin trough supratherapeutic, noon dose not given (no IV access), change dose to 1500mg  IV q24h, next dose due 2200  Continue zosyn 3.375gm IV q8h with 4h infusion  Doreene Eland, PharmD, BCPS.   Pager: [phone]  06/02/2013,10:03 AM

## 2013-06-02 NOTE — Progress Notes (Signed)
Progress Note from the Palliative Medicine Team at [facility]: Meagan Garcia appears severely agitated and having severe respiratory distress. She is on BiPAP and holding hands with her daughter and friend and they are praying for her. I spoke with her daughter, Meagan Garcia, who confirms that she now wants to make sure her mother is comfortable and does not wish for CPR or intubation. Meagan Garcia says she doesn't want her mother to suffer anymore. I also called and spoke with Meagan Garcia about my conversation with his sister and he confirms that he agrees with the comfort approach and DNR. He is preparing to come back to [place] now. I told him that I believe he should come because I'm concerned she has very limited time left to live. He is understanding and on his way. I will continue to follow and support Meagan Garcia and her family.    Objective: No Known Allergies Scheduled Meds:  Continuous Infusions: . morphine 2 mg/hr (06/02/13 1600)   PRN Meds:.albuterol, bisacodyl, haloperidol lactate, haloperidol lactate, ipratropium-albuterol, LORazepam, ondansetron (ZOFRAN) IV, ondansetron  BP 155/108  Pulse 39  Temp(Src) 97.2 F (36.2 C) (Axillary)  Resp 37  Ht 5\' 3"  (1.6 m)  Wt 98.6 kg (217 lb 6 oz)  BMI 38.52 kg/m2  SpO2 93%   PPS: 20%     Intake/Output Summary (Last 24 hours) at 06/02/13 1622 Last data filed at 06/02/13 1300  Gross per 24 hour  Intake   1643 ml  Output   1250 ml  Net    393 ml      LBM: 06/02/13      Physical Exam:  General: Severe distress HEENT: [place]/AT, dry mucouc membranes, on BiPAP  Chest: [place] labored breathes, RR 40s  CVS: Tachycardic  Ext: MAE, warm to touch  Neuro: Agitated, unable to follow commands  Labs: CBC    Component Value Date/Time   WBC 30.2* 06/02/2013 0337   WBC 2.5* 04/18/2013 0954   RBC 3.10* 06/02/2013 0337   RBC 3.46* 04/18/2013 0954   HGB 9.3* 06/02/2013 0337   HGB 9.8* 04/18/2013 0954   HCT 30.3* 06/02/2013 0337   HCT 31.0*  04/18/2013 0954   PLT 87* 06/02/2013 0337   PLT 148 04/18/2013 0954   MCV 97.7 06/02/2013 0337   MCV 89.6 04/18/2013 0954   MCH 30.0 06/02/2013 0337   MCH 28.3 04/18/2013 0954   MCHC 30.7 06/02/2013 0337   MCHC 31.6 04/18/2013 0954   RDW 19.1* 06/02/2013 0337   RDW 19.2* 04/18/2013 0954   LYMPHSABS 0.8 05/31/2013 0111   LYMPHSABS 0.4* 04/18/2013 0954   MONOABS 2.6* 05/31/2013 0111   MONOABS 0.2 04/18/2013 0954   EOSABS 0.0 05/31/2013 0111   EOSABS 0.0 04/18/2013 0954   BASOSABS 0.0 05/31/2013 0111   BASOSABS 0.0 04/18/2013 0954    BMET    Component Value Date/Time   NA 135* 06/02/2013 0337   NA 138 04/18/2013 0955   K 4.3 06/02/2013 0337   K 3.6 04/18/2013 0955   CL 100 06/02/2013 0337   CO2 20 06/02/2013 0337   CO2 22 04/18/2013 0955   GLUCOSE 177* 06/02/2013 0337   GLUCOSE 141* 04/18/2013 0955   BUN 26* 06/02/2013 0337   BUN 5.6* 04/18/2013 0955   CREATININE 0.88 06/02/2013 0337   CREATININE 0.8 04/18/2013 0955   CALCIUM 12.1* 06/02/2013 0337   CALCIUM 9.0 04/18/2013 0955   GFRNONAA 69* 06/02/2013 0337   GFRAA 81* 06/02/2013 0337    CMP  Component Value Date/Time   NA 135* 06/02/2013 0337   NA 138 04/18/2013 0955   K 4.3 06/02/2013 0337   K 3.6 04/18/2013 0955   CL 100 06/02/2013 0337   CO2 20 06/02/2013 0337   CO2 22 04/18/2013 0955   GLUCOSE 177* 06/02/2013 0337   GLUCOSE 141* 04/18/2013 0955   BUN 26* 06/02/2013 0337   BUN 5.6* 04/18/2013 0955   CREATININE 0.88 06/02/2013 0337   CREATININE 0.8 04/18/2013 0955   CALCIUM 12.1* 06/02/2013 0337   CALCIUM 9.0 04/18/2013 0955   PROT 5.9* 05/31/2013 0111   PROT 6.1* 04/18/2013 0955   ALBUMIN 2.1* 05/31/2013 0111   ALBUMIN 2.9* 04/18/2013 0955   AST 36 05/31/2013 0111   AST 15 04/18/2013 0955   ALT 36* 05/31/2013 0111   ALT 25 04/18/2013 0955   ALKPHOS 170* 05/31/2013 0111   ALKPHOS 69 04/18/2013 0955   BILITOT 0.7 05/31/2013 0111   BILITOT 0.50 04/18/2013 0955   GFRNONAA 69* 06/02/2013 0337   GFRAA 81* 06/02/2013 0337     Assessment and Plan: 1. Code Status: DNR 2. Symptom  Control: 1. Pain: Morphine SQ continuous infusion titrate to comfort.  2. Anxiety: Ativan prn. 3. Nausea: Ondansetron prn.  3. Psycho/Social: Emotional support provided to patient and family.  4. Spiritual: Chaplain following.  5. Disposition: Anticipate hospital death.     Time In Time Out Total Time Spent with Patient Total Overall Time  1500 1540 83min 47min    Greater than 50%  of this time was spent counseling and coordinating care related to the above assessment and plan.   Vinie Sill, NP Palliative Medicine Team Pager # [phone] (M-F 8a-5p) Team Phone # [phone] (Nights/Weekends)  1

## 2013-06-02 NOTE — Progress Notes (Signed)
Family called this RN into room to reposition patient.  Patient extremely agitated, rolling around in bed, pulling at Bipap mask.  HR 120s, RR>40.  Patient appears to be in severe respiratory distress.  Morphine 2mg  given IV with minimal relief.  Dr. Sheran Fava paged and arrived at bedside.  New orders received, will continue to monitor.

## 2013-06-02 NOTE — Progress Notes (Signed)
Patient became agitated with increased WOB, pulled off BIPAP mask with sats dropping in the 70's refusing to go back on--placed patient on a NRB. RN made aware.

## 2013-06-03 DIAGNOSIS — J309 Allergic rhinitis, unspecified: Secondary | ICD-10-CM

## 2013-06-03 LAB — CBC
HCT: 31.6 % — ABNORMAL LOW (ref 36.0–46.0)
HEMOGLOBIN: 9.8 g/dL — AB (ref 12.0–15.0)
MCH: 30.5 pg (ref 26.0–34.0)
MCHC: 31 g/dL (ref 30.0–36.0)
MCV: 98.4 fL (ref 78.0–100.0)
PLATELETS: 24 10*3/uL — AB (ref 150–400)
RBC: 3.21 MIL/uL — ABNORMAL LOW (ref 3.87–5.11)
RDW: 19.7 % — ABNORMAL HIGH (ref 11.5–15.5)
WBC: 26.1 10*3/uL — ABNORMAL HIGH (ref 4.0–10.5)

## 2013-06-03 LAB — BASIC METABOLIC PANEL
BUN: 44 mg/dL — ABNORMAL HIGH (ref 6–23)
CALCIUM: 13.3 mg/dL — AB (ref 8.4–10.5)
CO2: 15 mEq/L — ABNORMAL LOW (ref 19–32)
Chloride: 105 mEq/L (ref 96–112)
Creatinine, Ser: 1.48 mg/dL — ABNORMAL HIGH (ref 0.50–1.10)
GFR calc Af Amer: 43 mL/min — ABNORMAL LOW (ref >90)
GFR, EST NON AFRICAN AMERICAN: 37 mL/min — AB (ref >90)
GLUCOSE: 117 mg/dL — AB (ref 70–99)
Potassium: 6.1 mEq/L — ABNORMAL HIGH (ref 3.7–5.3)
Sodium: 141 mEq/L (ref 137–147)

## 2013-06-03 MED ORDER — SODIUM POLYSTYRENE SULFONATE 15 GM/60ML PO SUSP: 45.0000 g | Freq: Once | ORAL | Status: DC

## 2013-06-06 ENCOUNTER — Ambulatory Visit: Payer: BC Managed Care – PPO | Admitting: Critical Care Medicine

## 2013-06-06 LAB — CULTURE, BLOOD (ROUTINE X 2)
CULTURE: NO GROWTH
Culture: NO GROWTH

## 2013-06-07 ENCOUNTER — Encounter: Payer: BC Managed Care – PPO | Admitting: Physical Therapy

## 2013-06-07 DIAGNOSIS — G9341 Metabolic encephalopathy: Secondary | ICD-10-CM

## 2013-06-08 NOTE — Discharge Summary (Addendum)
Death Summary  Meagan Garcia ZOX:[id number] DOB: 01/24/1951 DOA: 06/28/13  PCP: [name], MD  Admit date: 2013/06/28 Date of Death: 07/14/2013  Final Diagnoses:  Principal Problem:   Metastatic cancer to brain Active Problems:   DEPRESSION   HYPERTENSION   GERD   Acute respiratory failure   Non-small cell cancer of right lung   DVT (deep venous thrombosis)   Hip fracture, left   Cerebral embolism with cerebral infarction   Slurred speech   HCAP (healthcare-associated pneumonia)   ICH (intracerebral hemorrhage)   Palliative care encounter   Respiratory distress, acute   Metabolic encephalopathy   Hyponatremia   AKI (acute kidney injury)   Hypercalcemia   History of present illness:  Meagan Garcia is a 64 y.o. 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  The patient is coming from SNF.  The patient was recently admitted to the hospital for CVA and right upper extremity DVT. After thorough workup she was sent to a nursing facility for rehabilitation. Today while she was eating she started having difficulty chewing and then she had drooling from her right side of the mouth. This was followed by difficulty with her speech. They called her primary doctor recommended to come to the ED and the patient was taken to the [facility]. Patient was taken there as a code stroke, emergent CT scan was showing possible hemorrhage which was followed by an MRI which confirmed the finding with metastasis and the patient was sent here for work further workup.  At the time of my evaluation the patient continued to have the slurred speech but denied any fever, chills, headache, cough, chest pain, palpitation, shortness of breath, nausea, vomiting, abdominal pain, bleeding, burning urination, dizziness, pedal edema.  The son provided the  same history who is the primary caregiver for the patient. Patient's husband lives in [place].   Hospital Course:   Metastatic NSCLC to brain and chest wall, slurred speech secondary to hemorrhagic conversion of some of her brain mets. Repeat CT head with stable hemorrhage: left parasagital area without any midline shift, with vasogenic edema, associated with other areas of bleeding in the right occipital lobe, right temporal lobe, right frontal lobe.  She was started on dexamethasone.  She continued to have slurred speech, difficulty communicating and following instructions. Her vital signs became more erratic with intermittent episodes of hypothermia, tachycardia, bradycardia, and labile blood pressures.    Acute respiratory failure with hypoxia likely secondary to progression of her right-sided hilar mass with obstruction of the right mainstem bronchus and possible postobstructive pneumonia. She was admitted to the step down unit where she was started on BiPAP. She was given IV antibiotics vancomycin and Zosyn for postobstructive pneumonia versus healthcare associated pneumonia. Blood cultures were no growth to date. She was given bronchodilators, however she had minimal improvement. Efforts to remove BiPAP and place her on facemask for unsuccessful secondary to increasing respiratory distress and tachypnea. She was essentially BiPAP dependent for the last several days of her life. She developed increasing shortness of breath despite our therapies.   Pfamily met with palliative care are indeterminate the patient would not want to be intubated and that she would like to be made as comfortable as possible. There was a high likelihood that she would need tracheostomy and chronic ventilation secondary to her progressive malignancy should she  be intubated. She was started on a morphine infusion to improve her feeling of shortness of breath, and she succumbed to her pneumonia and respiratory obstruction  from progressive malignancy. atient bipap dependent at this time.  Time of death around 0555 on 06-30-2013.    DVT RUE without significant edema, anticoagulation discontinued due to [place]   Left-sided hip fracture with limited mobility and requires assistance for transfers, bed mobility and ambulation.   Dysphagia,   she was unable to work with speech therapy because she needed to maintain her BiPAP mask.  Anxiety,  her bupropion initially held because this medication can decrease the seizure threshold. She was transitioned to as needed Ativan.  Chronic pain,  she was given IV morphine initially, and then gradually required a morphine infusion to assist with her shortness of breath.  Leukocytosis, likely due to PNA and malignancy.   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 With normal INR and APTT DIC is less likely.  Lovenox stopped due to [place] and low platelets.   NSTEMI, likely secondary to strain from recent stroke.  Not candidate for cath or a/c due to recent [place], ECHO: EF 55-60% no wall motion abl, grade 1 DD, mod MR, normal estimated CVP   Moderate to severe protein calorie malnutrition.    Metabolic encephalopathy secondary to hypoxia and hypercapnea.  Not improved with bipap.    Hyponatremia due to probable SIADH vs. Dehydration and resolved with IVF.  AKI due to progressive organ failure from metastatic cancer.    Hypercalcemia may have been due to [place] related PTHrP.  Progressed despite therapies described above.    Time: 0710  Signed:  Janece Canterbury  [facility] 06/15/2013, 6:15 PM

## 2013-06-08 NOTE — Progress Notes (Signed)
Upon entering room O2 sats and HR declined. At 0555 no respirations noted, no heart sounds or lung sounds auscultated. No pulse palpitated. Jacqlyn Larsen, RN called to room to verify. Cardiac rhythm shows asystole. Tylene Fantasia notified of pt's time of expiration.

## 2013-06-08 NOTE — Progress Notes (Signed)
Pt expired at 0555, pronounced by 2 RNs. Pt was essentialy comfort care and hospital death was anticipated.  Husband at bedside. NP to bedside to offer condolences and support. Offered chaplain but he refused. Death certificate completed and given to [facility], Therapist, sports.  Clance Boll, NP [facility]

## 2013-06-08 NOTE — Progress Notes (Signed)
Morning labs called to NP. Platelets worsening, kidney function worsening, and K is 6.1. NP to bedside and spoke to husband and explained treatment for the hyperkalemia and associated risks of high K+. He elects NOT to treat this. We then discussed d/c'ing all labs given the comfort care focus, but I didn't want to press him at this time. NP to leave report to attending to discuss d/c'ing further labs/tests. Pt is desatting into the 80s on Bipap at present and not doing well.  Clance Boll, NP [facility]

## 2013-06-08 DEATH — deceased

## 2013-06-09 ENCOUNTER — Encounter: Payer: BC Managed Care – PPO | Admitting: Physical Therapy

## 2013-06-15 DIAGNOSIS — E871 Hypo-osmolality and hyponatremia: Secondary | ICD-10-CM

## 2013-06-15 DIAGNOSIS — N179 Acute kidney failure, unspecified: Secondary | ICD-10-CM

## 2013-06-27 NOTE — Consult Note (Signed)
I have reviewed and discussed the care of this patient in detail with the nurse practitioner including pertinent patient records, physical exam findings and data. I agree with details of this encounter.  

## 2013-09-26 ENCOUNTER — Ambulatory Visit: Payer: BC Managed Care – PPO | Admitting: Audiology

## 2013-11-01 ENCOUNTER — Other Ambulatory Visit: Payer: Self-pay | Admitting: Pharmacist

## 2015-09-16 IMAGING — CT CT CHEST W/ CM
2 of 3 series · 15 of 36 positions shown, 18 images · IV contrast (Omnipaque 300)
Comparison: DG CHEST 2 VIEW dated 02/15/2013

CLINICAL DATA: Abnormal chest radiograph.  Smoker

EXAM:
CT CHEST WITH CONTRAST
TECHNIQUE: Multidetector CT imaging of the chest was performed during
intravenous contrast administration.
CONTRAST:  75mL OMNIPAQUE IOHEXOL 300 MG/ML  SOLN

[Series 2: chest routine with · axial · 0.70mm/px · z∈[-267,-37]mm · 12 of 55 slices shown, 15 images]
[im 5/55  mediastinal]
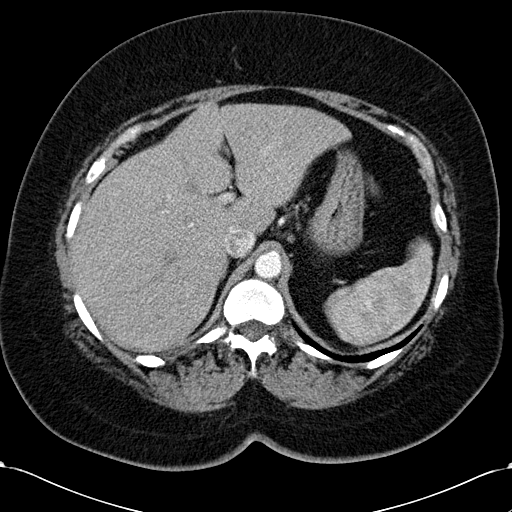
[im 5/55  lung]
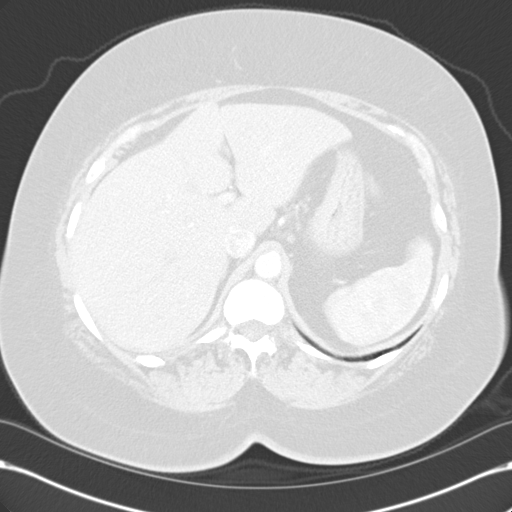
[im 9/55  lung]
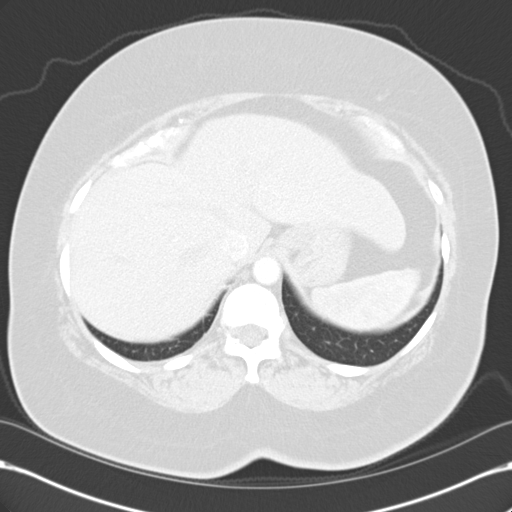
[im 13/55  lung]
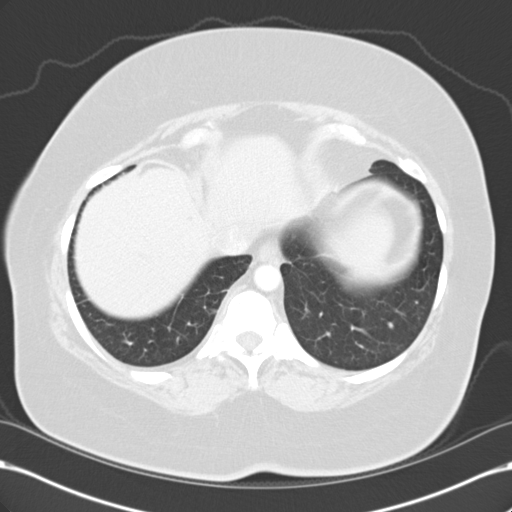
[im 17/55  lung]
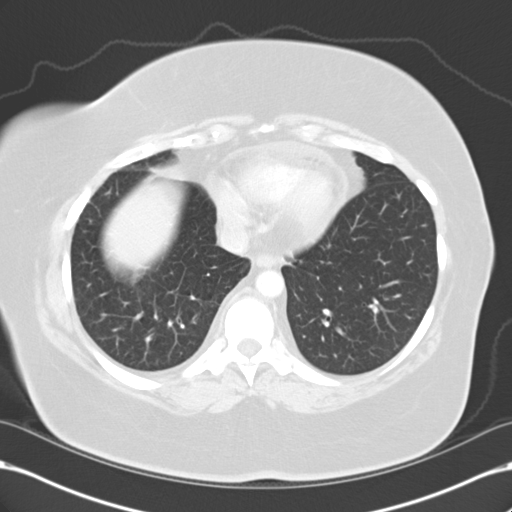
[im 21/55  mediastinal]
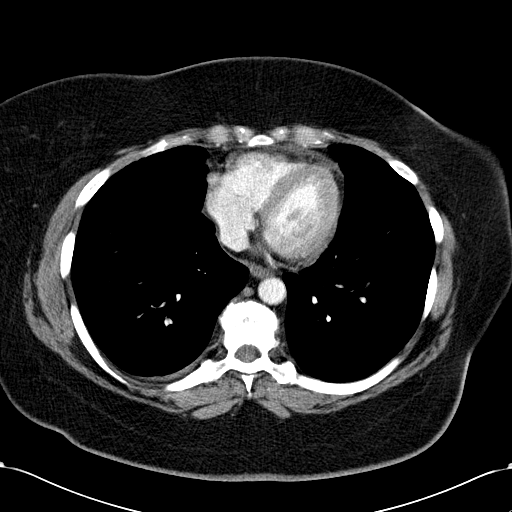
[im 21/55  lung]
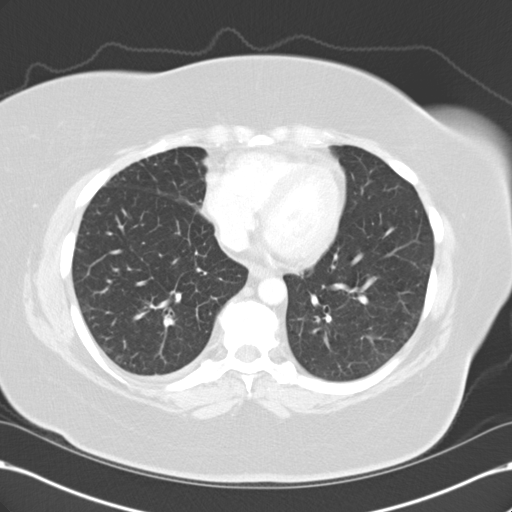
[im 25/55  lung]
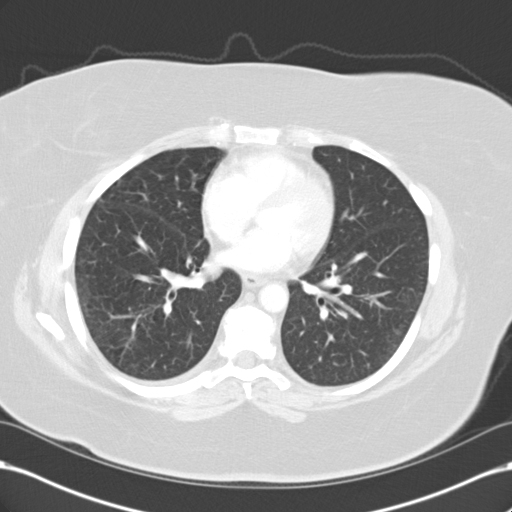
[im 31/55  lung]
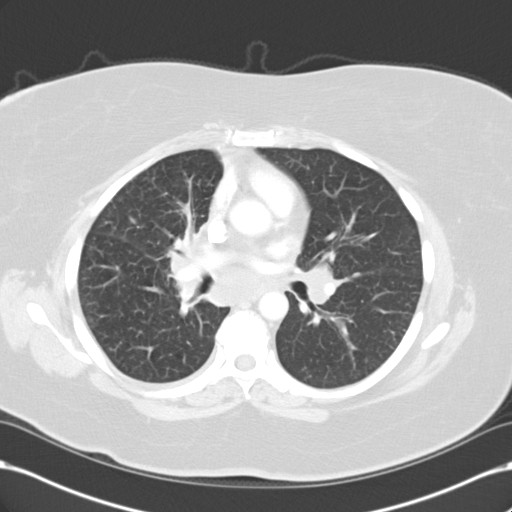
[im 35/55  lung]
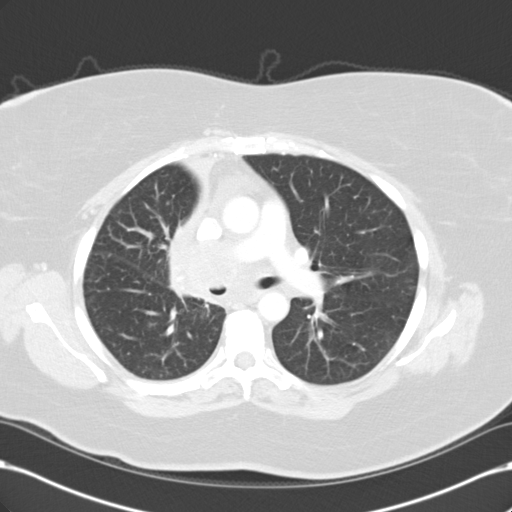
[im 39/55  mediastinal]
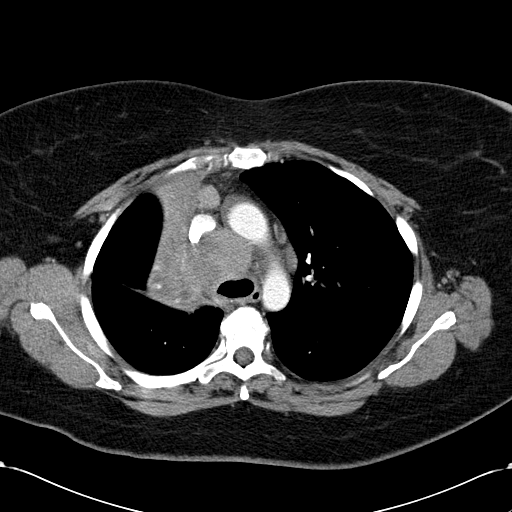
[im 39/55  lung]
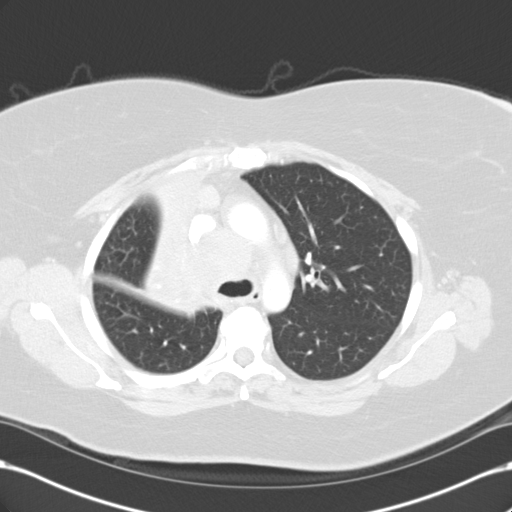
[im 43/55  lung]
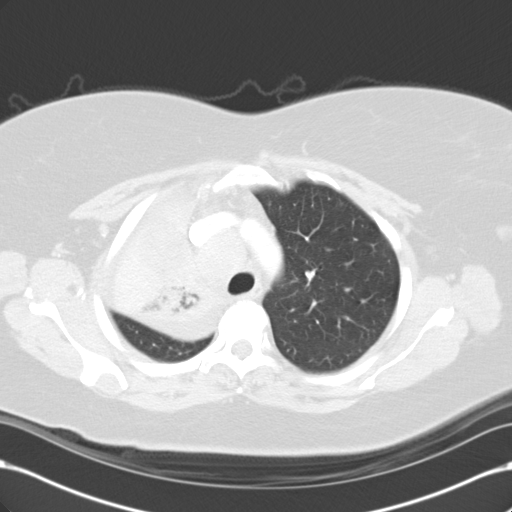
[im 47/55  lung]
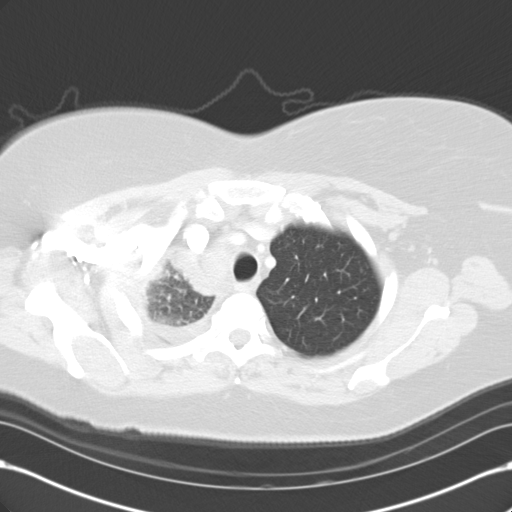
[im 51/55  lung]
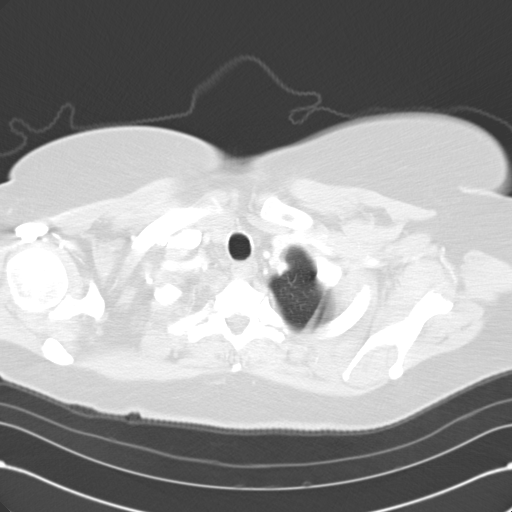

[Series 602: cor · coronal · 0.70mm/px · 3 of 104 slices shown]
[im 21/104  lung]
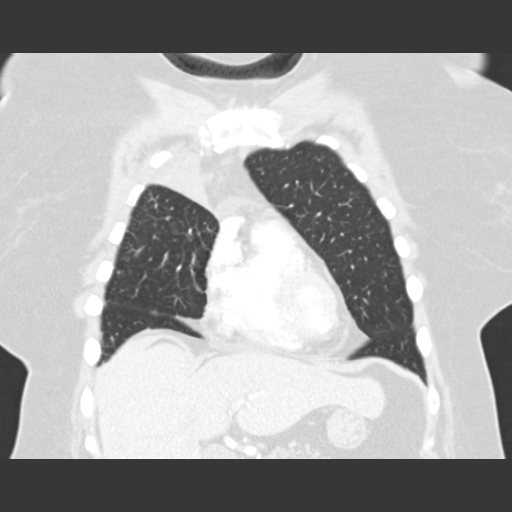
[im 42/104  lung]
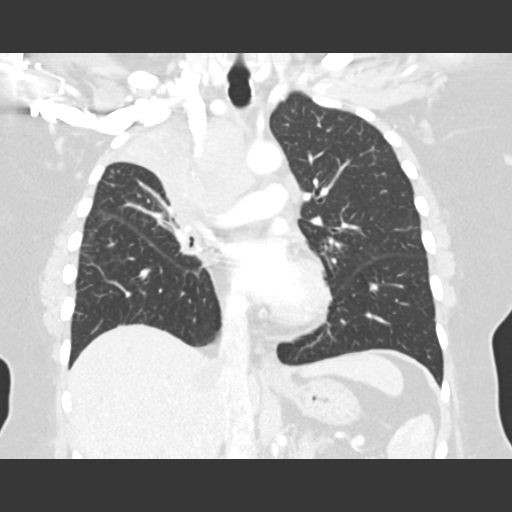
[im 62/104  lung]
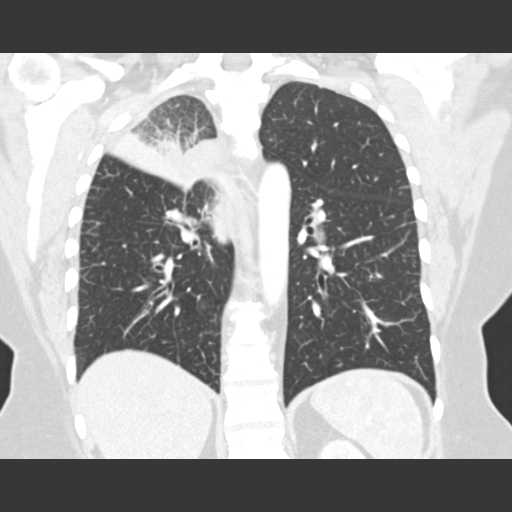

[15 of 36 positions shown; findings below may reference images not displayed]

FINDINGS: There is a right suprahilar mass obstructing the right upper lobe
bronchus. This mass extends into the mediastinum and measures 3.7 by
3.8 cm in axial dimension (image 20) and extends 7 cm in
craniocaudad dimension along the right paratracheal location. The
mass encroaches upon the bronchus intermedius narrowing this
bronchus (image 20). The mass abuts the right pulmonary artery.
Large subcarinal lymph node measures 30 mm. There is a large right
hilar lymph node measuring 18 mm. Potentially enlarged left hilar
lymph node measures 21 mm.

Review of the lung parenchyma demonstrates collapse of the right
upper lobe. No additional pulmonary nodularity.

Limited view of the upper abdomen partially images the adrenal
glands. No mass seen. No focal hepatic lesion. Review of the bone
windows demonstrates degenerative change of the spine. No aggressive
osseous lesion.
IMPRESSION: 1. Large right suprahilar mass obstructs the right upper lobe
bronchus and invades the bronchus intermedius consists with
bronchogenic carcinoma.
2. Bulky sub carinal adenopathy
3. Right hilar adenopathy a potential left hilar adenopathy.
4. Recommend FDG PET scanning for staging and oncology consultation.
This was made a call report.
